# Patient Record
Sex: Male | Born: 1960 | Race: White | Hispanic: No | Marital: Married | Smoking: Former smoker
Health system: Southern US, Community
[De-identification: ages and names within clinical notes are randomized; demographics above are authoritative.]

## PROBLEM LIST (undated history)

## (undated) DIAGNOSIS — E785 Hyperlipidemia, unspecified: Secondary | ICD-10-CM

## (undated) DIAGNOSIS — Z720 Tobacco use: Secondary | ICD-10-CM

## (undated) DIAGNOSIS — I2510 Atherosclerotic heart disease of native coronary artery without angina pectoris: Secondary | ICD-10-CM

## (undated) DIAGNOSIS — E7841 Elevated Lipoprotein(a): Secondary | ICD-10-CM

## (undated) DIAGNOSIS — M109 Gout, unspecified: Secondary | ICD-10-CM

## (undated) HISTORY — DX: Elevated lipoprotein(a): E78.41

## (undated) HISTORY — PX: LEG SURGERY: SHX1003

## (undated) HISTORY — DX: Hyperlipidemia, unspecified: E78.5

## (undated) HISTORY — DX: Tobacco use: Z72.0

## (undated) HISTORY — PX: CARDIAC CATHETERIZATION: SHX172

## (undated) HISTORY — PX: FRACTURE SURGERY: SHX138

---

## 1998-06-23 ENCOUNTER — Emergency Department (HOSPITAL_COMMUNITY): Admission: EM | Admit: 1998-06-23 | Discharge: 1998-06-23 | Payer: Self-pay | Admitting: Emergency Medicine

## 2001-11-28 ENCOUNTER — Encounter: Payer: Self-pay | Admitting: Gastroenterology

## 2001-11-28 ENCOUNTER — Ambulatory Visit (HOSPITAL_COMMUNITY): Admission: RE | Admit: 2001-11-28 | Discharge: 2001-11-28 | Payer: Self-pay | Admitting: Gastroenterology

## 2001-12-02 ENCOUNTER — Ambulatory Visit (HOSPITAL_COMMUNITY): Admission: RE | Admit: 2001-12-02 | Discharge: 2001-12-02 | Payer: Self-pay | Admitting: Gastroenterology

## 2001-12-02 ENCOUNTER — Encounter (INDEPENDENT_AMBULATORY_CARE_PROVIDER_SITE_OTHER): Payer: Self-pay | Admitting: Specialist

## 2004-03-22 ENCOUNTER — Emergency Department (HOSPITAL_COMMUNITY): Admission: EM | Admit: 2004-03-22 | Discharge: 2004-03-22 | Payer: Self-pay | Admitting: Emergency Medicine

## 2005-07-25 ENCOUNTER — Encounter: Admission: RE | Admit: 2005-07-25 | Discharge: 2005-07-25 | Payer: Self-pay | Admitting: Gastroenterology

## 2005-08-02 ENCOUNTER — Encounter: Admission: RE | Admit: 2005-08-02 | Discharge: 2005-08-02 | Payer: Self-pay | Admitting: Gastroenterology

## 2006-10-08 ENCOUNTER — Ambulatory Visit: Payer: Self-pay | Admitting: Gastroenterology

## 2007-10-09 ENCOUNTER — Encounter: Admission: RE | Admit: 2007-10-09 | Discharge: 2007-10-09 | Payer: Self-pay | Admitting: Gastroenterology

## 2010-07-03 ENCOUNTER — Encounter: Admission: RE | Admit: 2010-07-03 | Discharge: 2010-07-03 | Payer: Self-pay | Admitting: Family Medicine

## 2011-02-26 ENCOUNTER — Emergency Department (HOSPITAL_BASED_OUTPATIENT_CLINIC_OR_DEPARTMENT_OTHER)
Admission: EM | Admit: 2011-02-26 | Discharge: 2011-02-26 | Disposition: A | Discharge status: Home / Self Care | Payer: 59 | Attending: Emergency Medicine | Admitting: Emergency Medicine

## 2011-02-26 ENCOUNTER — Emergency Department (INDEPENDENT_AMBULATORY_CARE_PROVIDER_SITE_OTHER): Payer: 59

## 2011-02-26 ENCOUNTER — Other Ambulatory Visit (HOSPITAL_BASED_OUTPATIENT_CLINIC_OR_DEPARTMENT_OTHER): Payer: Self-pay | Admitting: Emergency Medicine

## 2011-02-26 DIAGNOSIS — F17200 Nicotine dependence, unspecified, uncomplicated: Secondary | ICD-10-CM | POA: Insufficient documentation

## 2011-02-26 DIAGNOSIS — R4781 Slurred speech: Secondary | ICD-10-CM

## 2011-02-26 DIAGNOSIS — R471 Dysarthria and anarthria: Secondary | ICD-10-CM | POA: Insufficient documentation

## 2011-02-26 DIAGNOSIS — R1310 Dysphagia, unspecified: Secondary | ICD-10-CM

## 2011-03-03 ENCOUNTER — Other Ambulatory Visit (HOSPITAL_BASED_OUTPATIENT_CLINIC_OR_DEPARTMENT_OTHER): Payer: Self-pay

## 2013-06-10 ENCOUNTER — Emergency Department (HOSPITAL_COMMUNITY)
Admission: EM | Admit: 2013-06-10 | Discharge: 2013-06-10 | Disposition: A | Discharge status: Home / Self Care | Payer: 59 | Attending: Emergency Medicine | Admitting: Emergency Medicine

## 2013-06-10 ENCOUNTER — Emergency Department (HOSPITAL_COMMUNITY): Payer: 59

## 2013-06-10 ENCOUNTER — Encounter (HOSPITAL_COMMUNITY): Payer: Self-pay | Admitting: Emergency Medicine

## 2013-06-10 DIAGNOSIS — Z8781 Personal history of (healed) traumatic fracture: Secondary | ICD-10-CM | POA: Insufficient documentation

## 2013-06-10 DIAGNOSIS — F17200 Nicotine dependence, unspecified, uncomplicated: Secondary | ICD-10-CM | POA: Insufficient documentation

## 2013-06-10 DIAGNOSIS — R479 Unspecified speech disturbances: Secondary | ICD-10-CM

## 2013-06-10 DIAGNOSIS — R4789 Other speech disturbances: Secondary | ICD-10-CM | POA: Insufficient documentation

## 2013-06-10 LAB — BASIC METABOLIC PANEL
BUN: 17 mg/dL (ref 6–23)
Calcium: 9.1 mg/dL (ref 8.4–10.5)
Creatinine, Ser: 0.99 mg/dL (ref 0.50–1.35)
GFR calc Af Amer: >90 mL/min (ref >90)
GFR calc non Af Amer: >90 mL/min (ref >90)
Potassium: 3.9 mEq/L (ref 3.5–5.1)

## 2013-06-10 LAB — CBC
HCT: 44.3 % (ref 39.0–52.0)
MCH: 33.6 pg (ref 26.0–34.0)
MCHC: 34.8 g/dL (ref 30.0–36.0)
RDW: 12.7 % (ref 11.5–15.5)

## 2013-06-10 NOTE — ED Notes (Addendum)
Pt c/o increasing expressive aphasia x 1 week and increasing intermittent numbness and tingling in hands x 2-3 days.  Denies pain and injury.  Pt sts "it feels like the cheeks of my mouth are sucking in and I'm having a hard time forming words."

## 2013-06-10 NOTE — ED Provider Notes (Signed)
CSN: [id number]     Arrival date & time 06/10/13  1047 History   First MD Initiated Contact with Patient 06/10/13 1114     Chief Complaint  Patient presents with  . Numbness  . Aphasia   (Consider location/radiation/quality/duration/timing/severity/associated sxs/prior Treatment) Patient is a 52 y.o. male presenting with neurologic complaint. The history is provided by the patient.  Neurologic Problem This is a new problem. The current episode started more than 1 week ago. The problem occurs constantly. The problem has been gradually worsening. Pertinent negatives include no chest pain, no abdominal pain and no shortness of breath. Nothing aggravates the symptoms. Nothing relieves the symptoms. He has tried nothing for the symptoms. The treatment provided no relief.    History reviewed. No pertinent past medical history. Past Surgical History  Procedure Laterality Date  . Fracture surgery     History reviewed. No pertinent family history. History  Substance Use Topics  . Smoking status: Current Every Day Smoker -- 0.50 packs/day    Types: Cigarettes  . Smokeless tobacco: Never Used  . Alcohol Use: Yes    Review of Systems  Constitutional: Negative for fever.  Respiratory: Negative for cough and shortness of breath.   Cardiovascular: Negative for chest pain and leg swelling.  Gastrointestinal: Negative for vomiting and abdominal pain.  All other systems reviewed and are negative.    Allergies  Review of patient's allergies indicates not on file.  Home Medications  No current outpatient prescriptions on file. BP 147/103  Pulse 79  Temp(Src) 97.9 F (36.6 C) (Oral)  Resp 20  SpO2 99% Physical Exam  Nursing note and vitals reviewed. Constitutional: He is oriented to person, place, and time. He appears well-developed and well-nourished. No distress.  HENT:  Head: Normocephalic and atraumatic.  Mouth/Throat: No oropharyngeal exudate.  Eyes: EOM are normal. Pupils are  equal, round, and reactive to light.  Neck: Normal range of motion. Neck supple.  Cardiovascular: Normal rate and regular rhythm.  Exam reveals no friction rub.   No murmur heard. Pulmonary/Chest: Effort normal and breath sounds normal. No respiratory distress. He has no wheezes. He has no rales.  Abdominal: He exhibits no distension. There is no tenderness. There is no rebound.  Musculoskeletal: Normal range of motion. He exhibits no edema.  Neurological: He is alert and oriented to person, place, and time. No cranial nerve deficit. He exhibits normal muscle tone. Coordination normal.  Occasionally slurs his words, very mild slurring when it does happen.  Skin: He is not diaphoretic.    ED Course  Procedures (including critical care time) Labs Review Labs Reviewed - No data to display Imaging Review No results found.   Date: 06/10/2013  Rate: 70  Rhythm: normal sinus rhythm  QRS Axis: normal  Intervals: normal  ST/T Wave abnormalities: early repolarization  Conduction Disutrbances:none  Narrative Interpretation:   Old EKG Reviewed: none available    MDM   1. Speech problem    52 year old male presents with concerns for speech issues. Patient states he's been having trouble forming words and feels like his speech is changed over the past week. He states he feels like he needs to breast-feed counseling as bad breath. He is not having difficulty swallowing. He denies any headache, vision changes, as weakness or numbness in the arms or legs. He states multiple friends and coworkers as stated that he is having speech difficulties and having difficulty with his words. He saw doctor at an urgent care, and told to get  head scan last week while [place] was at the beach. Vitals are stable. His speech patterns were normal, his voice sounds occasionally slurred and very mildly slurred when it occasionally happens. He is not consistently having aphasia. He has normal swallow. Normal coordination,  normal strength and sensation extremities. Normal cranial nerves. Do not see any evidence of any salivary stones. We'll MRI his head to look for possible ischemia.  No stroke on MRI. Given resource guide for f/u.  Dagmar Hait, MD 06/10/13 [phone]

## 2013-06-10 NOTE — Progress Notes (Signed)
   CARE MANAGEMENT ED NOTE 06/10/2013  Patient:  Simar,Teofilo R   Account Number:  [id number]  Date Initiated:  06/10/2013  Documentation initiated by:  Edd Arbour  Subjective/Objective Assessment:   52 yr old male with united health care coverage without a pcp listed     Subjective/Objective Assessment Detail:     Action/Plan:   CM spoke with pt who confirms he does not have a pcp  see below   Action/Plan Detail:   Anticipated DC Date:       Status Recommendation to Physician:   Result of Recommendation:    Other ED Services  Consult Working Plan    DC Planning Services  Other  PCP issues  Outpatient Services - Pt will follow up    Choice offered to / List presented to:            Status of service:  Completed, signed off  ED Comments:   ED Comments Detail:  [facility] ED CM noted pt with coverage but no pcp listed Spoke with pt who confirms no pcp [facility] ED CM spoke with pt on how to obtain an in network pcp with insurance coverage via the customer service number or web site The pt voiced understanding CM encouraged pt and discussed pt's responsibility to verify with pt's insurance carrier that any recommended medical provider offered by any emergency room or a hospital provider is within the carrier's network. The pt voiced understanding

## 2013-06-10 NOTE — ED Notes (Signed)
Patient transported to MRI 

## 2014-09-02 ENCOUNTER — Other Ambulatory Visit: Payer: Self-pay | Admitting: Family Medicine

## 2014-09-02 DIAGNOSIS — R945 Abnormal results of liver function studies: Secondary | ICD-10-CM

## 2014-09-08 ENCOUNTER — Ambulatory Visit
Admission: RE | Admit: 2014-09-08 | Discharge: 2014-09-08 | Disposition: A | Discharge status: Home / Self Care | Payer: 59 | Source: Ambulatory Visit | Attending: Family Medicine | Admitting: Family Medicine

## 2014-09-08 DIAGNOSIS — R945 Abnormal results of liver function studies: Secondary | ICD-10-CM

## 2015-04-25 ENCOUNTER — Emergency Department (HOSPITAL_COMMUNITY): Payer: Commercial Managed Care - HMO

## 2015-04-25 ENCOUNTER — Encounter (HOSPITAL_COMMUNITY): Payer: Self-pay | Admitting: *Deleted

## 2015-04-25 ENCOUNTER — Emergency Department (HOSPITAL_COMMUNITY)
Admission: EM | Admit: 2015-04-25 | Discharge: 2015-04-25 | Disposition: A | Discharge status: Home / Self Care | Payer: Commercial Managed Care - HMO | Attending: Emergency Medicine | Admitting: Emergency Medicine

## 2015-04-25 ENCOUNTER — Emergency Department (HOSPITAL_COMMUNITY)
Admission: EM | Admit: 2015-04-25 | Discharge: 2015-04-25 | Disposition: A | Discharge status: Nursing Home (Includes ICF) | Payer: Commercial Managed Care - HMO | Source: Home / Self Care

## 2015-04-25 ENCOUNTER — Encounter (HOSPITAL_COMMUNITY): Payer: Self-pay

## 2015-04-25 DIAGNOSIS — R202 Paresthesia of skin: Secondary | ICD-10-CM | POA: Diagnosis not present

## 2015-04-25 DIAGNOSIS — R42 Dizziness and giddiness: Secondary | ICD-10-CM | POA: Diagnosis not present

## 2015-04-25 DIAGNOSIS — I10 Essential (primary) hypertension: Secondary | ICD-10-CM | POA: Diagnosis not present

## 2015-04-25 DIAGNOSIS — R51 Headache: Secondary | ICD-10-CM | POA: Insufficient documentation

## 2015-04-25 DIAGNOSIS — R4701 Aphasia: Secondary | ICD-10-CM | POA: Diagnosis not present

## 2015-04-25 DIAGNOSIS — R519 Headache, unspecified: Secondary | ICD-10-CM

## 2015-04-25 DIAGNOSIS — Z720 Tobacco use: Secondary | ICD-10-CM | POA: Diagnosis not present

## 2015-04-25 DIAGNOSIS — R2990 Unspecified symptoms and signs involving the nervous system: Secondary | ICD-10-CM | POA: Diagnosis not present

## 2015-04-25 DIAGNOSIS — E785 Hyperlipidemia, unspecified: Secondary | ICD-10-CM

## 2015-04-25 DIAGNOSIS — R200 Anesthesia of skin: Secondary | ICD-10-CM | POA: Diagnosis present

## 2015-04-25 LAB — CBC
HCT: 42.9 % (ref 39.0–52.0)
HEMOGLOBIN: 14.6 g/dL (ref 13.0–17.0)
MCH: 33.6 pg (ref 26.0–34.0)
MCHC: 34 g/dL (ref 30.0–36.0)
MCV: 98.6 fL (ref 78.0–100.0)
Platelets: 205 10*3/uL (ref 150–400)
RBC: 4.35 MIL/uL (ref 4.22–5.81)
RDW: 13 % (ref 11.5–15.5)
WBC: 7 10*3/uL (ref 4.0–10.5)

## 2015-04-25 LAB — RAPID URINE DRUG SCREEN, HOSP PERFORMED
AMPHETAMINES: NOT DETECTED
BARBITURATES: NOT DETECTED
Benzodiazepines: NOT DETECTED
COCAINE: NOT DETECTED
Opiates: NOT DETECTED
TETRAHYDROCANNABINOL: NOT DETECTED

## 2015-04-25 LAB — COMPREHENSIVE METABOLIC PANEL
ALBUMIN: 4 g/dL (ref 3.5–5.0)
ALK PHOS: 55 U/L (ref 38–126)
ALT: 92 U/L — ABNORMAL HIGH (ref 17–63)
AST: 48 U/L — AB (ref 15–41)
Anion gap: 10 (ref 5–15)
BILIRUBIN TOTAL: 0.4 mg/dL (ref 0.3–1.2)
BUN: 15 mg/dL (ref 6–20)
CO2: 24 mmol/L (ref 22–32)
Calcium: 9.2 mg/dL (ref 8.9–10.3)
Chloride: 106 mmol/L (ref 101–111)
Creatinine, Ser: 1 mg/dL (ref 0.61–1.24)
GFR calc Af Amer: >60 mL/min (ref >60)
GFR calc non Af Amer: >60 mL/min (ref >60)
GLUCOSE: 79 mg/dL (ref 65–99)
POTASSIUM: 3.9 mmol/L (ref 3.5–5.1)
Sodium: 140 mmol/L (ref 135–145)
TOTAL PROTEIN: 7.4 g/dL (ref 6.5–8.1)

## 2015-04-25 LAB — I-STAT CHEM 8, ED
BUN: 19 mg/dL (ref 6–20)
CREATININE: 0.9 mg/dL (ref 0.61–1.24)
Calcium, Ion: 1.19 mmol/L (ref 1.12–1.23)
Chloride: 107 mmol/L (ref 101–111)
Glucose, Bld: 77 mg/dL (ref 65–99)
HEMATOCRIT: 45 % (ref 39.0–52.0)
Hemoglobin: 15.3 g/dL (ref 13.0–17.0)
POTASSIUM: 3.9 mmol/L (ref 3.5–5.1)
Sodium: 141 mmol/L (ref 135–145)
TCO2: 21 mmol/L (ref 0–100)

## 2015-04-25 LAB — I-STAT TROPONIN, ED: Troponin i, poc: 0 ng/mL (ref 0.00–0.08)

## 2015-04-25 LAB — DIFFERENTIAL
BASOS ABS: 0.1 10*3/uL (ref 0.0–0.1)
Basophils Relative: 1 % (ref 0–1)
Eosinophils Absolute: 0.2 10*3/uL (ref 0.0–0.7)
Eosinophils Relative: 3 % (ref 0–5)
LYMPHS ABS: 1.8 10*3/uL (ref 0.7–4.0)
LYMPHS PCT: 25 % (ref 12–46)
Monocytes Absolute: 0.8 10*3/uL (ref 0.1–1.0)
Monocytes Relative: 11 % (ref 3–12)
NEUTROS PCT: 60 % (ref 43–77)
Neutro Abs: 4.2 10*3/uL (ref 1.7–7.7)

## 2015-04-25 LAB — URINALYSIS, ROUTINE W REFLEX MICROSCOPIC
BILIRUBIN URINE: NEGATIVE
GLUCOSE, UA: NEGATIVE mg/dL
HGB URINE DIPSTICK: NEGATIVE
KETONES UR: 15 mg/dL — AB
Leukocytes, UA: NEGATIVE
NITRITE: NEGATIVE
PH: 5.5 (ref 5.0–8.0)
Protein, ur: NEGATIVE mg/dL
Specific Gravity, Urine: 1.028 (ref 1.005–1.030)
Urobilinogen, UA: 0.2 mg/dL (ref 0.0–1.0)

## 2015-04-25 LAB — ETHANOL: Alcohol, Ethyl (B): 31 mg/dL — ABNORMAL HIGH (ref <5)

## 2015-04-25 LAB — PROTIME-INR
INR: 1.08 (ref 0.00–1.49)
Prothrombin Time: 14.2 seconds (ref 11.6–15.2)

## 2015-04-25 LAB — APTT: APTT: 31 s (ref 24–37)

## 2015-04-25 MED ORDER — MECLIZINE HCL 25 MG PO TABS: 25.0000 mg | ORAL_TABLET | Freq: Three times a day (TID) | ORAL | Status: DC | PRN

## 2015-04-25 MED ORDER — SODIUM CHLORIDE 0.9 % IV SOLN
Freq: Once | INTRAVENOUS | Status: AC
  Administered 2015-04-25: 18:00:00 via INTRAVENOUS

## 2015-04-25 NOTE — ED Notes (Signed)
Verified with provider, pt is outside of Code Stroke time parameters. Order to Activate Code Stroke cancelled.

## 2015-04-25 NOTE — ED Notes (Signed)
Headache that started yesterday. 

## 2015-04-25 NOTE — ED Notes (Signed)
Pt states that yesterday he experienced difficulty speaking and "mouth thickness." Pt states that last week he was having intermittent dizziness with position changes in bed and upon standing. Currently pt appears deficit free besides some left facial numbness.

## 2015-04-25 NOTE — ED Notes (Addendum)
States that since yesterday, he has had trouble thinking of words to say , mouth feels thick,brain feels scrambled for past few days, HA pain "7', took 2 tylenol w no change in symptoms

## 2015-04-25 NOTE — ED Provider Notes (Signed)
CSN: [id number]     Arrival date & time 04/25/15  1806 History   First MD Initiated Contact with Patient 04/25/15 1820     Chief Complaint  Patient presents with  . Numbness  . Aphasia     (Consider location/radiation/quality/duration/timing/severity/associated sxs/prior Treatment) HPI Comments: Patient presents today from [facility] due to concern for CVA.   He reports that he has been having dizziness over the past week.  He describes it as room spinning induced with movement, which only lasts a few seconds.  He states that yesterday evening he felt that he was having difficulty moving his mouth to form words.  He also reports perioral numbness that has been present since last evening.  He also reports that he has had an intermittent headache of the left head shooting over to the right head that has been occurring intermittently since last night.  He also states that he has had some numbness of his left arm that has been present since last evening.  He denies difficulty swallowing, weakness, vision changes, nausea, vomiting, chest pain, or SOB.  He denies history of HTN or DM.  He does have a history of Hyperlipidemia and does smoke 0.5 ppd.   He reports that his mother had a CVA at the age of 48 years old.  The history is provided by the patient.    History reviewed. No pertinent past medical history. Past Surgical History  Procedure Laterality Date  . Fracture surgery     Family History  Problem Relation Age of Onset  . Stroke Mother 65  . Heart attack Father    Social History  Substance Use Topics  . Smoking status: Current Every Day Smoker -- 0.50 packs/day    Types: Cigarettes  . Smokeless tobacco: Never Used  . Alcohol Use: Yes    Review of Systems  All other systems reviewed and are negative.     Allergies  Review of patient's allergies indicates no known allergies.  Home Medications   Prior to Admission medications   Medication Sig Start Date End Date Taking?  Authorizing Provider  acetaminophen (TYLENOL) 500 MG tablet Take 1,000 mg by mouth every 6 (six) hours as needed for moderate pain.   Yes Historical Provider, MD  colchicine 0.6 MG tablet Take 0.6 mg by mouth daily as needed (for gout flareups).  02/16/15  Yes Historical Provider, MD   BP 115/67 mmHg  Pulse 77  Temp(Src) 98.7 F (37.1 C) (Oral)  Resp 20  Ht 5' 8.5" (1.74 m)  Wt 195 lb (88.451 kg)  BMI 29.21 kg/m2  SpO2 96% Physical Exam  Constitutional: He appears well-developed and well-nourished.  HENT:  Head: Normocephalic and atraumatic.  Mouth/Throat: Oropharynx is clear and moist.  Eyes: EOM are normal. Pupils are equal, round, and reactive to light.  Neck: Normal range of motion. Neck supple.  Cardiovascular: Normal rate, regular rhythm and normal heart sounds.   Pulmonary/Chest: Effort normal and breath sounds normal.  Musculoskeletal: Normal range of motion.  Neurological: He is alert. No cranial nerve deficit or sensory deficit. Coordination and gait normal. GCS eye subscore is 4. GCS verbal subscore is 5. GCS motor subscore is 6.  Muscle strength 5/5 or RUE and RLE Muscle strength 4/5 of LUE and LLE  Skin: Skin is warm and dry.  Psychiatric: He has a normal mood and affect.  Nursing note and vitals reviewed.   ED Course  Procedures (including critical care time) Labs Review Labs Reviewed  ETHANOL  PROTIME-INR  APTT  CBC  DIFFERENTIAL  COMPREHENSIVE METABOLIC PANEL  URINE RAPID DRUG SCREEN, HOSP PERFORMED  URINALYSIS, ROUTINE W REFLEX MICROSCOPIC (NOT AT [facility])  I-STAT CHEM 8, ED  I-STAT TROPOININ, ED    Imaging Review No results found. I have personally reviewed and evaluated these images and lab results as part of my medical decision-making.   EKG Interpretation   Date/Time:  Monday April 25 2015 18:21:31 EDT Ventricular Rate:  77 PR Interval:  194 QRS Duration: 83 QT Interval:  354 QTC Calculation: 401 R Axis:   64 Text Interpretation:  Sinus  rhythm Atrial premature complex ST elev,  probable normal early repol pattern No significant change since last  tracing Artifact Confirmed by ZACKOWSKI  MD, SCOTT [phone]) on 04/25/2015  6:31:04 PM      MDM   Final diagnoses:  None   Patient presents today with complaints of dizziness, numbness of his left arm, perioral numbness, and dysphagia. Onset of symptoms last evening so code stroke was not called.  Labs today unremarkable.  CT head is negative.  MRI brain is also negative.  Patient stable for discharge.  Patient discussed with Dr. Deretha Emory who is in agreement with the plan.      Santiago Glad, PA-C 04/28/15 1131  Vanetta Mulders, MD 04/29/15 [phone]

## 2015-04-25 NOTE — ED Notes (Signed)
Pt back from MRI 

## 2015-04-25 NOTE — ED Notes (Signed)
PA at bedside.

## 2015-04-25 NOTE — ED Provider Notes (Signed)
CSN: [id number]     Arrival date & time 04/25/15  1658 History   None    Chief Complaint  Patient presents with  . Cough  . Aphasia   (Consider location/radiation/quality/duration/timing/severity/associated sxs/prior Treatment) HPI  1 day history of dizziness with changing position from sitting or lying to standing. Typically last for a minute or less and then resolves. Does not worsen with rotation of the head. Associated w/ L sided headache and difficulty finding words to speak. Denies any CP, SOB, palpitations, nausea, syncope, Fevers, cough.  Symptoms are getting worse.  History reviewed. No pertinent past medical history. Past Surgical History  Procedure Laterality Date  . Fracture surgery     Family History  Problem Relation Age of Onset  . Stroke Mother 16  . Heart attack Father    Social History  Substance Use Topics  . Smoking status: Current Every Day Smoker -- 0.50 packs/day    Types: Cigarettes  . Smokeless tobacco: Never Used  . Alcohol Use: Yes    Review of Systems Per HPI with all other pertinent systems negative.   Allergies  Review of patient's allergies indicates no known allergies.  Home Medications   Prior to Admission medications   Not on File   BP 141/91 mmHg  Pulse 86  Temp(Src) 98.3 F (36.8 C) (Oral)  SpO2 99% Physical Exam Physical Exam  Constitutional: oriented to person, place, and time. appears well-developed and well-nourished. No distress.  HENT:  Head: Normocephalic and atraumatic.  Eyes: EOMI. PERRL.  Neck: Normal range of motion.  Cardiovascular: RRR, no m/r/g, 2+ distal pulses,  Pulmonary/Chest: Effort normal and breath sounds normal. No respiratory distress.  Abdominal: Soft. Bowel sounds are normal. NonTTP, no distension.  Musculoskeletal: Normal range of motion. Non ttp, no effusion.  Neurological: Decreased sensation of L facial sensation - CN otherwise intact, rhomberg negative, no dysmetria,.  Skin: Skin is warm. No  rash noted. non diaphoretic.  Psychiatric: normal mood and affect. behavior is normal. Judgment and thought content normal.   ED Course  Procedures (including critical care time) Labs Review Labs Reviewed - No data to display  Imaging Review No results found.   MDM   1. Stroke-like symptoms   2. HLD (hyperlipidemia)   3. Essential hypertension    Patient presenting with symptoms worrisome of a stroke including aphasia, left-sided headache, dizziness. Of note orthostatic vital signs are normal. Patient is hypertensive (165/115 standing). Family history significant for mother with a stroke at age 35 and father with heart attacks. Patient states he's otherwise been healthy and has only been diagnosed with hyperlipidemia in the past. We will start an IV on the patient consented down to the emergency room for stroke workup as deemed appropriate by ED staff.   Ozella Rocks, MD 04/25/15 [phone]

## 2015-04-25 NOTE — ED Notes (Signed)
Pt in MRI.

## 2015-06-16 ENCOUNTER — Other Ambulatory Visit: Payer: Self-pay | Admitting: Family Medicine

## 2015-06-16 DIAGNOSIS — R7989 Other specified abnormal findings of blood chemistry: Secondary | ICD-10-CM

## 2015-06-16 DIAGNOSIS — R945 Abnormal results of liver function studies: Principal | ICD-10-CM

## 2015-06-20 ENCOUNTER — Ambulatory Visit
Admission: RE | Admit: 2015-06-20 | Discharge: 2015-06-20 | Disposition: A | Discharge status: Home / Self Care | Payer: Commercial Managed Care - HMO | Source: Ambulatory Visit | Attending: Family Medicine | Admitting: Family Medicine

## 2015-06-20 ENCOUNTER — Other Ambulatory Visit: Payer: Commercial Managed Care - HMO

## 2015-06-20 DIAGNOSIS — R945 Abnormal results of liver function studies: Principal | ICD-10-CM

## 2015-06-20 DIAGNOSIS — R7989 Other specified abnormal findings of blood chemistry: Secondary | ICD-10-CM

## 2015-07-01 ENCOUNTER — Telehealth: Payer: Self-pay | Admitting: Lab

## 2015-07-01 NOTE — Telephone Encounter (Signed)
Pt has appmt to get labs done on 07/05/2015 @ 1130.  He will receive an appmt with Dr. Luciana Axeomer at that time

## 2015-07-05 ENCOUNTER — Other Ambulatory Visit: Payer: Commercial Managed Care - HMO

## 2015-07-05 DIAGNOSIS — B1920 Unspecified viral hepatitis C without hepatic coma: Secondary | ICD-10-CM

## 2015-07-05 LAB — CBC WITH DIFFERENTIAL/PLATELET
BASOS PCT: 1 % (ref 0–1)
Basophils Absolute: 0.1 10*3/uL (ref 0.0–0.1)
Eosinophils Absolute: 0.3 10*3/uL (ref 0.0–0.7)
Eosinophils Relative: 4 % (ref 0–5)
HCT: 46.8 % (ref 39.0–52.0)
HEMOGLOBIN: 16 g/dL (ref 13.0–17.0)
Lymphocytes Relative: 29 % (ref 12–46)
Lymphs Abs: 1.9 10*3/uL (ref 0.7–4.0)
MCH: 33.4 pg (ref 26.0–34.0)
MCHC: 34.2 g/dL (ref 30.0–36.0)
MCV: 97.7 fL (ref 78.0–100.0)
MONOS PCT: 11 % (ref 3–12)
MPV: 11.3 fL (ref 8.6–12.4)
Monocytes Absolute: 0.7 10*3/uL (ref 0.1–1.0)
NEUTROS ABS: 3.6 10*3/uL (ref 1.7–7.7)
NEUTROS PCT: 55 % (ref 43–77)
Platelets: 209 10*3/uL (ref 150–400)
RBC: 4.79 MIL/uL (ref 4.22–5.81)
RDW: 12.6 % (ref 11.5–15.5)
WBC: 6.5 10*3/uL (ref 4.0–10.5)

## 2015-07-05 LAB — IRON: Iron: 200 ug/dL — ABNORMAL HIGH (ref 50–180)

## 2015-07-06 LAB — PROTIME-INR
INR: 1.01 (ref <1.50)
PROTHROMBIN TIME: 13.4 s (ref 11.6–15.2)

## 2015-07-06 LAB — HEPATITIS C RNA QUANTITATIVE
HCV Quantitative Log: 6.6 {Log} — ABNORMAL HIGH (ref <1.18)
HCV Quantitative: 3974796 IU/mL — ABNORMAL HIGH (ref <15)

## 2015-07-06 LAB — HIV ANTIBODY (ROUTINE TESTING W REFLEX): HIV 1&2 Ab, 4th Generation: NONREACTIVE

## 2015-07-06 LAB — ANA: ANA: NEGATIVE

## 2015-07-06 LAB — HEPATITIS B SURFACE ANTIBODY,QUALITATIVE: Hep B S Ab: NEGATIVE

## 2015-07-06 LAB — HEPATITIS B CORE ANTIBODY, TOTAL: Hep B Core Total Ab: NONREACTIVE

## 2015-07-06 LAB — HEPATITIS A ANTIBODY, TOTAL: Hep A Total Ab: NONREACTIVE

## 2015-07-06 LAB — HEPATITIS B SURFACE ANTIGEN: Hepatitis B Surface Ag: NEGATIVE

## 2015-07-06 NOTE — Telephone Encounter (Signed)
Pt had labs done on 07/05/2015 and was given an appmt to see Dr Luciana Axeomer on 08/17/2015 @ 920am

## 2015-07-08 LAB — HEPATITIS C GENOTYPE

## 2015-08-10 ENCOUNTER — Ambulatory Visit (INDEPENDENT_AMBULATORY_CARE_PROVIDER_SITE_OTHER): Payer: Commercial Managed Care - HMO | Admitting: Internal Medicine

## 2015-08-10 ENCOUNTER — Encounter: Payer: Self-pay | Admitting: Internal Medicine

## 2015-08-10 VITALS — BP 122/81 | HR 72 | Temp 97.9°F | Ht 68.5 in | Wt 204.0 lb

## 2015-08-10 DIAGNOSIS — B182 Chronic viral hepatitis C: Secondary | ICD-10-CM

## 2015-08-10 DIAGNOSIS — M109 Gout, unspecified: Secondary | ICD-10-CM | POA: Insufficient documentation

## 2015-08-10 MED ORDER — LEDIPASVIR-SOFOSBUVIR 90-400 MG PO TABS: 1.0000 | ORAL_TABLET | Freq: Every day | ORAL | Status: DC

## 2015-08-10 NOTE — Patient Instructions (Signed)
Date 08/10/2015  Dear Mr. Jacob Black, As discussed in the ID Clinic, your hepatitis C therapy will include the following medications:          Harvoni 90mg /400mg  tablet:           Take 1 tablet by mouth once daily   Please note that ALL MEDICATIONS WILL START ON THE SAME DATE for a total of 12 weeks. ---------------------------------------------------------------- Your HCV Treatment Start Date: TBA   Your HCV genotype:  1a    Liver Fibrosis: TBD    ---------------------------------------------------------------- YOUR PHARMACY CONTACT:   [facility] [address] Phone: [phone] Hours: Monday to Friday 7:30 am to 6:00 pm   Please always contact your pharmacy at least 3-4 business days before you run out of medications to ensure your next month's medication is ready or 1 week prior to running out if you receive it by mail.  Remember, each prescription is for 28 days. ---------------------------------------------------------------- GENERAL NOTES REGARDING YOUR HEPATITIS C MEDICATION:  SOFOSBUVIR/LEDIPASVIR (HARVONI): - Harvoni tablet is taken daily with OR without food. - The tablets are orange. - The tablets should be stored at room temperature.  - Acid reducing agents such as H2 blockers (ie. Pepcid (famotidine), Zantac (ranitidine), Tagamet (cimetidine), Axid (nizatidine) and proton pump inhibitors (ie. Prilosec (omeprazole), Protonix (pantoprazole), Nexium (esomeprazole), or Aciphex (rabeprazole)) can decrease effectiveness of Harvoni. Do not take until you have discussed with a health care provider.    -Antacids that contain magnesium and/or aluminum hydroxide (ie. Milk of Magensia, Rolaids, Gaviscon, Maalox, Mylanta, an dArthritis Pain Formula)can reduce absorption of Harvoni, so take them at least 4 hours before or after Harvoni.  -Calcium carbonate (calcium supplements or antacids such as Tums, Caltrate,  Os-Cal)needs to be taken at least 4 hours hours before or after Harvoni.  -St. John's wort or any products that contain St. John's wort like some herbal supplements  Please inform the office prior to starting any of these medications.  - The common side effects associated with Harvoni include:      1. Fatigue      2. Headache      3. Nausea      4. Diarrhea      5. Insomnia  Please note that this only lists the most common side effects and is NOT a comprehensive list of the potential side effects of these medications. For more information, please review the drug information sheets that come with your medication package from the pharmacy.  ---------------------------------------------------------------- GENERAL HELPFUL HINTS ON HCV THERAPY: 1. Stay well-hydrated. 2. Notify the ID Clinic of any changes in your other over-the-counter/herbal or prescription medications. 3. If you miss a dose of your medication, take the missed dose as soon as you remember. Return to your regular time/dose schedule the next day.  4.  Do not stop taking your medications without first talking with your healthcare provider. 5.  You may take Tylenol (acetaminophen), as long as the dose is less than 2000 mg (OR no more than 4 tablets of the Tylenol Extra Strengths 500mg  tablet) in 24 hours. 6.  You will see our pharmacist-specialist within the first 2 weeks of starting your medication. 7.  You will need to obtain routine labs around week 4 and12 weeks after starting and then 3 to 6 months after finishing Harvoni.    Staci RighterOMER, Argelio Granier, MD  [facility] [address] [place],  [place]  [address] [phone]
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
## 2015-08-11 ENCOUNTER — Other Ambulatory Visit: Payer: Self-pay | Admitting: Pharmacist Clinician (PhC)/ Clinical Pharmacy Specialist

## 2015-08-11 MED ORDER — LEDIPASVIR-SOFOSBUVIR 90-400 MG PO TABS: 1.0000 | ORAL_TABLET | Freq: Every day | ORAL | Status: DC

## 2015-08-12 ENCOUNTER — Encounter: Payer: Self-pay | Admitting: Pharmacy Technician

## 2015-08-17 ENCOUNTER — Encounter: Payer: Commercial Managed Care - HMO | Admitting: Internal Medicine

## 2015-09-07 ENCOUNTER — Ambulatory Visit (HOSPITAL_COMMUNITY)
Admission: RE | Admit: 2015-09-07 | Discharge: 2015-09-07 | Disposition: A | Discharge status: Home / Self Care | Payer: Commercial Managed Care - HMO | Source: Ambulatory Visit | Attending: Internal Medicine | Admitting: Internal Medicine

## 2015-09-07 DIAGNOSIS — R7989 Other specified abnormal findings of blood chemistry: Secondary | ICD-10-CM | POA: Diagnosis not present

## 2015-09-07 DIAGNOSIS — B182 Chronic viral hepatitis C: Secondary | ICD-10-CM | POA: Diagnosis present

## 2015-09-08 ENCOUNTER — Other Ambulatory Visit: Payer: Commercial Managed Care - HMO

## 2015-09-08 ENCOUNTER — Ambulatory Visit (INDEPENDENT_AMBULATORY_CARE_PROVIDER_SITE_OTHER): Payer: Commercial Managed Care - HMO | Admitting: Pharmacist Clinician (PhC)/ Clinical Pharmacy Specialist

## 2015-09-08 DIAGNOSIS — B182 Chronic viral hepatitis C: Secondary | ICD-10-CM

## 2015-09-08 NOTE — Progress Notes (Signed)
HPI: Jacob Black is a 55 y.o. male who is for his pharmacy hep c f/u.   Lab Results  Component Value Date   HCVGENOTYPE 1a 07/05/2015    Allergies: No Known Allergies  Vitals:    Past Medical History: No past medical history on file.  Social History: Social History   Social History  . Marital Status: Married    Spouse Name: N/A  . Number of Children: N/A  . Years of Education: N/A   Social History Main Topics  . Smoking status: Current Every Day Smoker -- 0.50 packs/day    Types: Cigarettes    Start date: 09/03/1974  . Smokeless tobacco: Never Used  . Alcohol Use: 0.0 oz/week    0 Standard drinks or equivalent per week     Comment: occasional  . Drug Use: No  . Sexual Activity: Not on file   Other Topics Concern  . Not on file   Social History Narrative    Labs: HEP B S AB (no units)  Date Value  07/05/2015 NEG   HEPATITIS B SURFACE AG (no units)  Date Value  07/05/2015 NEGATIVE    Lab Results  Component Value Date   HCVGENOTYPE 1a 07/05/2015    Hepatitis C RNA quantitative Latest Ref Rng 07/05/2015  HCV Quantitative <15 IU/mL [id number](H)  HCV Quantitative Log <1.18 log 10 6.60(H)    AST (U/L)  Date Value  04/25/2015 48*   ALT (U/L)  Date Value  04/25/2015 92*   INR (no units)  Date Value  07/05/2015 1.01  04/25/2015 1.08    CrCl: CrCl cannot be calculated (Unknown ideal weight.).  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 No side effects. He only takes colchicine on gout flares only and has not taken in a while. It has a minor interaction only. He is on no OTC meds except for some nyquil for his colds over the past couple of days. Just warned him that it has some tylenol in it already and to not take too much total dose. He understood. His elastography also came back yesterday and it  showed F3/4. Explain the results to him. We are going to get labs today and he is to f/u with dr. Luciana Axeomer in Feb. He is going to call for the results of his labs in a couple of day.   Recommendations: Cont Harvoni 1 PO qday x 3 months VL today  Jacob Black, Jacob Black, [place]arm.D., BCPS, AAHIVP Clinical Infectious Disease Pharmacist [facility] 09/08/2015, 10:25 AM

## 2015-09-13 LAB — HEPATITIS C RNA QUANTITATIVE
HCV Quantitative Log: <1.18 {Log} (ref <1.18)
HCV Quantitative: <15 IU/mL (ref <15)

## 2015-09-28 ENCOUNTER — Telehealth: Payer: Self-pay | Admitting: Pharmacist Clinician (PhC)/ Clinical Pharmacy Specialist

## 2015-09-28 NOTE — Telephone Encounter (Signed)
Jacob Black called to inquired about his hep C VL. Explained to him that it's now <15 after about 1 month on therapy. He was happy to hear it. He also asked about whether Harvoni would induce stress. I told him no. He has other factors that are causing this right now.

## 2015-10-13 ENCOUNTER — Ambulatory Visit (INDEPENDENT_AMBULATORY_CARE_PROVIDER_SITE_OTHER): Payer: Commercial Managed Care - HMO | Admitting: Internal Medicine

## 2015-10-13 ENCOUNTER — Encounter: Payer: Self-pay | Admitting: Internal Medicine

## 2015-10-13 VITALS — BP 150/84 | HR 81 | Temp 97.3°F | Wt 205.0 lb

## 2015-10-13 DIAGNOSIS — K7400 Hepatic fibrosis, unspecified: Secondary | ICD-10-CM

## 2015-10-13 DIAGNOSIS — B182 Chronic viral hepatitis C: Secondary | ICD-10-CM

## 2015-10-15 DIAGNOSIS — K7400 Hepatic fibrosis, unspecified: Secondary | ICD-10-CM | POA: Insufficient documentation

## 2015-10-15 NOTE — Assessment & Plan Note (Addendum)
Will need follow up with GI but will defer for now. Deferring Hepatitis A and B as well until he completes treatment.

## 2015-10-15 NOTE — Progress Notes (Signed)
   Subjective:    Patient ID: Jacob Black, male    DOB: [birth year], 55 y.o.   MRN: [id number]  HPI Here for follow up of HCV.   Has genotype 1a, viral load of 3.9 million, hepatitis A and B non immune.  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  Started Harvoni about 9 weeks ago and initial viral load after 4 weeks was undetectable.  He is noticing more irritability since starting Harvoni with short trigger with his wife but no SI or HI. He feels he is handling it well despite his irritability and feels he wants to continue with the medication.  Also has noted some fatigue but continues with his ADLs.  No missed doses.     Review of Systems  Constitutional: Positive for fatigue. Negative for activity change.  Gastrointestinal: Negative for nausea and diarrhea.  Skin: Negative for rash.  Neurological: Negative for dizziness and headaches.  Psychiatric/Behavioral: Negative for sleep disturbance.       Objective:   Physical Exam  Constitutional: He appears well-developed and well-nourished. No distress.  Eyes: No scleral icterus.  Cardiovascular: Normal rate, regular rhythm and normal heart sounds.   No murmur heard. Pulmonary/Chest: Effort normal and breath sounds normal. No respiratory distress.  Skin: No rash noted.          Assessment & Plan:

## 2015-10-15 NOTE — Assessment & Plan Note (Signed)
Will continue treatment and check vl after completion.  He does not feel he needs to stop.

## 2015-10-24 ENCOUNTER — Telehealth: Payer: Self-pay | Admitting: *Deleted

## 2015-10-24 NOTE — Telephone Encounter (Signed)
Patient called, stating he received a letter informing him that his elastography (completed 09/07/15) needed more information from the ordering provider in order for the claim to be paid.  No notes in the chart that this was denied prior authorization. Will 'cc Marylen Ponto for follow up. Andree Coss, RN

## 2015-10-25 NOTE — Telephone Encounter (Signed)
It may have been under the wrong diagnosis code.  Should be under the chronic hepatitis C.   thanks

## 2015-10-26 ENCOUNTER — Telehealth: Payer: Self-pay | Admitting: *Deleted

## 2015-11-01 NOTE — Telephone Encounter (Signed)
[employer], they do have the correct diagnosis codes. Per insurance, they no longer need additional information for the claim. The patient has a bill of approximately $589. $500 is from his deductible (which is now fulfilled), $89 is from his co-insurance for the image.  RN left message with this information on the patient's voicemail. Andree Coss, RN

## 2015-11-08 NOTE — Telephone Encounter (Signed)
Error

## 2015-11-15 ENCOUNTER — Ambulatory Visit (INDEPENDENT_AMBULATORY_CARE_PROVIDER_SITE_OTHER): Payer: Commercial Managed Care - HMO | Admitting: Internal Medicine

## 2015-11-15 ENCOUNTER — Encounter: Payer: Self-pay | Admitting: Internal Medicine

## 2015-11-15 VITALS — BP 149/103 | HR 77 | Temp 98.0°F | Wt 210.0 lb

## 2015-11-15 DIAGNOSIS — K7400 Hepatic fibrosis, unspecified: Secondary | ICD-10-CM

## 2015-11-15 DIAGNOSIS — B182 Chronic viral hepatitis C: Secondary | ICD-10-CM | POA: Diagnosis not present

## 2015-11-15 LAB — COMPLETE METABOLIC PANEL WITH GFR
ALT: 27 U/L (ref 9–46)
AST: 23 U/L (ref 10–35)
Albumin: 4.3 g/dL (ref 3.6–5.1)
Alkaline Phosphatase: 56 U/L (ref 40–115)
BUN: 18 mg/dL (ref 7–25)
CALCIUM: 9.9 mg/dL (ref 8.6–10.3)
CHLORIDE: 103 mmol/L (ref 98–110)
CO2: 24 mmol/L (ref 20–31)
Creat: 1.01 mg/dL (ref 0.70–1.33)
GFR, EST NON AFRICAN AMERICAN: 85 mL/min (ref >=60)
GFR, Est African American: >89 mL/min (ref >=60)
Glucose, Bld: 141 mg/dL — ABNORMAL HIGH (ref 65–99)
POTASSIUM: 4.4 mmol/L (ref 3.5–5.3)
Sodium: 138 mmol/L (ref 135–146)
Total Bilirubin: 0.4 mg/dL (ref 0.2–1.2)
Total Protein: 7.6 g/dL (ref 6.1–8.1)

## 2015-11-15 NOTE — Assessment & Plan Note (Signed)
Will send to GI for ? EGD HCC screening next visit and every 6 months.

## 2015-11-15 NOTE — Progress Notes (Signed)
   Subjective:    Patient ID: Jacob Black, male    DOB: 06/19/1961, 55 y.o.   MRN: [id number]  HPI Here for follow up of HCV.   Has genotype 1a, viral load of 3.9 million, hepatitis A and B non immune.  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  Now has just completed Harvoni.  Improved mood off of the medication.  Pleased to be done.  Does not want the hepatitis A and B vaccine without sedation.  No weight loss. Drinks some.     Review of Systems  Constitutional: Negative for activity change and fatigue.  Gastrointestinal: Negative for nausea and diarrhea.  Skin: Negative for rash.  Neurological: Negative for dizziness and headaches.  Psychiatric/Behavioral: Negative for sleep disturbance.       Objective:   Physical Exam  Constitutional: He appears well-developed and well-nourished. No distress.  Eyes: No scleral icterus.  Cardiovascular: Normal rate, regular rhythm and normal heart sounds.   No murmur heard. Skin: No rash noted.   Social History   Social History  . Marital Status: Married    Spouse Name: N/A  . Number of Children: N/A  . Years of Education: N/A   Occupational History  . Not on file.   Social History Main Topics  . Smoking status: Current Every Day Smoker -- 0.50 packs/day    Types: Cigarettes    Start date: 09/03/1974  . Smokeless tobacco: Never Used  . Alcohol Use: 0.0 oz/week    0 Standard drinks or equivalent per week     Comment: occasional  . Drug Use: No  . Sexual Activity: Not on file   Other Topics Concern  . Not on file   Social History Narrative         Assessment & Plan:

## 2015-11-15 NOTE — Assessment & Plan Note (Signed)
End of treatment lab today and rtc 4 months for SVR12.   

## 2015-11-16 LAB — HEPATITIS C RNA QUANTITATIVE: HCV Quantitative: NOT DETECTED IU/mL (ref <15)

## 2016-03-05 ENCOUNTER — Ambulatory Visit (HOSPITAL_COMMUNITY): Payer: Commercial Managed Care - HMO

## 2016-03-05 ENCOUNTER — Encounter (HOSPITAL_COMMUNITY): Payer: Self-pay | Admitting: Emergency Medicine

## 2016-03-05 ENCOUNTER — Ambulatory Visit (HOSPITAL_COMMUNITY)
Admission: EM | Admit: 2016-03-05 | Discharge: 2016-03-05 | Disposition: A | Discharge status: Home / Self Care | Payer: Commercial Managed Care - HMO | Attending: Family Medicine | Admitting: Family Medicine

## 2016-03-05 DIAGNOSIS — F17210 Nicotine dependence, cigarettes, uncomplicated: Secondary | ICD-10-CM | POA: Diagnosis not present

## 2016-03-05 DIAGNOSIS — Z8249 Family history of ischemic heart disease and other diseases of the circulatory system: Secondary | ICD-10-CM | POA: Insufficient documentation

## 2016-03-05 DIAGNOSIS — S39012A Strain of muscle, fascia and tendon of lower back, initial encounter: Secondary | ICD-10-CM | POA: Insufficient documentation

## 2016-03-05 DIAGNOSIS — W19XXXA Unspecified fall, initial encounter: Secondary | ICD-10-CM | POA: Diagnosis not present

## 2016-03-05 DIAGNOSIS — S93602A Unspecified sprain of left foot, initial encounter: Secondary | ICD-10-CM | POA: Diagnosis not present

## 2016-03-05 DIAGNOSIS — Z823 Family history of stroke: Secondary | ICD-10-CM | POA: Diagnosis not present

## 2016-03-05 DIAGNOSIS — M62830 Muscle spasm of back: Secondary | ICD-10-CM | POA: Insufficient documentation

## 2016-03-05 DIAGNOSIS — M79672 Pain in left foot: Secondary | ICD-10-CM

## 2016-03-05 MED ORDER — KETOROLAC TROMETHAMINE 60 MG/2ML IM SOLN
60.0000 mg | Freq: Once | INTRAMUSCULAR | Status: AC
  Administered 2016-03-05: 60 mg via INTRAMUSCULAR

## 2016-03-05 MED ORDER — HYDROCODONE-ACETAMINOPHEN 5-325 MG PO TABS: 1.0000 | ORAL_TABLET | ORAL | Status: DC | PRN

## 2016-03-05 MED ORDER — KETOROLAC TROMETHAMINE 60 MG/2ML IM SOLN
INTRAMUSCULAR | Status: AC
  Filled 2016-03-05: qty 2

## 2016-03-05 MED ORDER — NAPROXEN 375 MG PO TABS: 375.0000 mg | ORAL_TABLET | Freq: Two times a day (BID) | ORAL | Status: DC

## 2016-03-05 MED ORDER — CYCLOBENZAPRINE HCL 10 MG PO TABS: ORAL_TABLET | ORAL | Status: DC

## 2016-03-05 MED ORDER — HYDROCODONE-ACETAMINOPHEN 5-325 MG PO TABS: 2.0000 | ORAL_TABLET | Freq: Once | ORAL | Status: DC

## 2016-03-05 NOTE — Discharge Instructions (Signed)
Foot Sprain °A foot sprain is an injury to one of the strong bands of tissue (ligaments) that connect and support the many bones in your feet. The ligament can be stretched too much or it can tear. A tear can be either partial or complete. The severity of the sprain depends on how much of the ligament was damaged or torn. °CAUSES °A foot sprain is usually caused by suddenly twisting or pivoting your foot. °RISK FACTORS °This injury is more likely to occur in people who: °· Play a sport, such as basketball or football. °· Exercise or play a sport without warming up. °· Start a new workout or sport. °· Suddenly increase how long or hard they exercise or play a sport. °SYMPTOMS °Symptoms of this condition start soon after an injury and include: °· Pain, especially in the arch of the foot. °· Bruising. °· Swelling. °· Inability to walk or use the foot to support body weight. °DIAGNOSIS °This condition is diagnosed with a medical history and physical exam. You may also have imaging tests, such as: °· X-rays to make sure there are no broken bones (fractures). °· MRI to see if the ligament has torn. °TREATMENT °Treatment varies depending on the severity of your sprain. Mild sprains can be treated with rest, ice, compression, and elevation (RICE). If your ligament is overstretched or partially torn, treatment usually involves keeping your foot in a fixed position (immobilization) for a period of time. To help you do this, your health care provider will apply a bandage, splint, or walking boot to keep your foot from moving until it heals. You may also be advised to use crutches or a scooter for a few weeks to avoid bearing weight on your foot while it is healing. °If your ligament is fully torn, you may need surgery to reconnect the ligament to the bone. After surgery, a cast or splint will be applied and will need to stay on your foot while it heals. °Your health care provider may also suggest exercises or physical therapy  to strengthen your foot. °HOME CARE INSTRUCTIONS °If You Have a Bandage, Splint, or Walking Boot: °· Wear it as directed by your health care provider. Remove it only as directed by your health care provider. °· Loosen the bandage, splint, or walking boot if your toes become numb and tingle, or if they turn cold and blue. °Bathing °· If your health care provider approves bathing and showering, cover the bandage or splint with a watertight plastic bag to protect it from water. Do not let the bandage or splint get wet. °Managing Pain, Stiffness, and Swelling  °· If directed, apply ice to the injured area: °¨ Put ice in a plastic bag. °¨ Place a towel between your skin and the bag. °¨ Leave the ice on for 20 minutes, 2-3 times per day. °· Move your toes often to avoid stiffness and to lessen swelling. °· Raise (elevate) the injured area above the level of your heart while you are sitting or lying down. °Driving °· Do not drive or operate heavy machinery while taking pain medicine. °· Do not drive while wearing a bandage, splint, or walking boot on a foot that you use for driving. °Activity °· Rest as directed by your health care provider. °· Do not use the injured foot to support your body weight until your health care provider says that you can. Use crutches or other supportive devices as directed by your health care provider. °· Ask your health care   provider what activities are safe for you. Gradually increase how much and how far you walk until your health care provider says it is safe to return to full activity.  Do any exercise or physical therapy as directed by your health care provider. General Instructions  If a splint was applied, do not put pressure on any part of it until it is fully hardened. This may take several hours.  Take medicines only as directed by your health care provider. These include over-the-counter medicines and prescription medicines.  Keep all follow-up visits as directed by your  health care provider. This is important.  When you can walk without pain, wear supportive shoes that have stiff soles. Do not wear flip-flops, and do not walk barefoot. SEEK MEDICAL CARE IF:  Your pain is not controlled with medicine.  Your bruising or swelling gets worse or does not get better with treatment.  Your splint or walking boot is damaged. SEEK IMMEDIATE MEDICAL CARE IF:  Your foot is numb or blue.  Your foot feels colder than normal.   This information is not intended to replace advice given to you by your health care provider. Make sure you discuss any questions you have with your health care provider.   Document Released: 02/09/2002 Document Revised: 01/04/2015 Document Reviewed: 06/23/2014 Elsevier Interactive Patient Education 2016 Elsevier Inc.  Lumbosacral Strain Lumbosacral strain is a strain of any of the parts that make up your lumbosacral vertebrae. Your lumbosacral vertebrae are the bones that make up the lower third of your backbone. Your lumbosacral vertebrae are held together by muscles and tough, fibrous tissue (ligaments).  CAUSES  A sudden blow to your back can cause lumbosacral strain. Also, anything that causes an excessive stretch of the muscles in the low back can cause this strain. This is typically seen when people exert themselves strenuously, fall, lift heavy objects, bend, or crouch repeatedly. RISK FACTORS  Physically demanding work.  Participation in pushing or pulling sports or sports that require a sudden twist of the back (tennis, golf, baseball).  Weight lifting.  Excessive lower back curvature.  Forward-tilted pelvis.  Weak back or abdominal muscles or both.  Tight hamstrings. SIGNS AND SYMPTOMS  Lumbosacral strain may cause pain in the area of your injury or pain that moves (radiates) down your leg.  DIAGNOSIS Your health care provider can often diagnose lumbosacral strain through a physical exam. In some cases, you may need  tests such as X-ray exams.  TREATMENT  Treatment for your lower back injury depends on many factors that your clinician will have to evaluate. However, most treatment will include the use of anti-inflammatory medicines. HOME CARE INSTRUCTIONS   Avoid hard physical activities (tennis, racquetball, waterskiing) if you are not in proper physical condition for it. This may aggravate or create problems.  If you have a back problem, avoid sports requiring sudden body movements. Swimming and walking are generally safer activities.  Maintain good posture.  Maintain a healthy weight.  For acute conditions, you may put ice on the injured area.  Put ice in a plastic bag.  Place a towel between your skin and the bag.  Leave the ice on for 20 minutes, 2-3 times a day.  When the low back starts healing, stretching and strengthening exercises may be recommended. SEEK MEDICAL CARE IF:  Your back pain is getting worse.  You experience severe back pain not relieved with medicines. SEEK IMMEDIATE MEDICAL CARE IF:   You have numbness, tingling, weakness, or problems  with the use of your arms or legs.  There is a change in bowel or bladder control.  You have increasing pain in any area of the body, including your belly (abdomen).  You notice shortness of breath, dizziness, or feel faint.  You feel sick to your stomach (nauseous), are throwing up (vomiting), or become sweaty.  You notice discoloration of your toes or legs, or your feet get very cold. MAKE SURE YOU:   Understand these instructions.  Will watch your condition.  Will get help right away if you are not doing well or get worse.   This information is not intended to replace advice given to you by your health care provider. Make sure you discuss any questions you have with your health care provider.   Document Released: 05/30/2005 Document Revised: 09/10/2014 Document Reviewed: 04/08/2013 Elsevier Interactive Patient Education  2016 Elsevier Inc.  Muscle Cramps and Spasms Muscle cramps and spasms are when muscles tighten by themselves. They usually get better within minutes. Muscle cramps are painful. They are usually stronger and last longer than muscle spasms. Muscle spasms may or may not be painful. They can last a few seconds or much longer. HOME CARE  Drink enough fluid to keep your pee (urine) clear or pale yellow.  Massage, stretch, and relax the muscle.  Use a warm towel, heating pad, or warm shower water on tight muscles.  Place ice on the muscle if it is tender or in pain.  Put ice in a plastic bag.  Place a towel between your skin and the bag.  Leave the ice on for 15-20 minutes, 03-04 times a day.  Only take medicine as told by your doctor. GET HELP RIGHT AWAY IF:  Your cramps or spasms get worse, happen more often, or do not get better with time. MAKE SURE YOU:  Understand these instructions.  Will watch your condition.  Will get help right away if you are not doing well or get worse.   This information is not intended to replace advice given to you by your health care provider. Make sure you discuss any questions you have with your health care provider.   Document Released: 08/02/2008 Document Revised: 12/15/2012 Document Reviewed: 08/06/2012 Elsevier Interactive Patient Education [employer]2016 Elsevier Inc.

## 2016-03-05 NOTE — ED Notes (Signed)
Patient reports left foot pain for one week, thought to be gout.  Patient reports 3 days ago stepping awkwardly and felt pop in foot.  Since then left foot has been swollen, painful, red, warm to touch and has back pain .

## 2016-03-05 NOTE — ED Provider Notes (Signed)
CSN: [id number]     Arrival date & time 03/05/16  [id number] History   First MD Initiated Contact with Patient 03/05/16 1009     Chief Complaint  Patient presents with  . Foot Pain   (Consider location/radiation/quality/duration/timing/severity/associated sxs/prior Treatment) HPI Comments: 55-year-old male states that he was having pain in the plantar aspect of the forefoot for approximately one week. He thought it may be gout but he has never had pain like this located to the area described above. As a result of pain with weightbearing he altered the mechanism of weightbearing and ambulation in such a way that it overstressed a portion of his foot causing a sudden snap and pop and sudden pain to the dorsal aspect of the forefoot causing him to fall and injure his low back. He is complaining of pain to the forefoot dorsal and plantar aspect. He is also complaining of pain to the lower paralumbar musculature. Pain is worse in the back with movement, twisting, bending, pulling and other similar movements. Denies distal weakness, paresthesias.   History reviewed. No pertinent past medical history. Past Surgical History  Procedure Laterality Date  . Fracture surgery     Family History  Problem Relation Age of Onset  . Stroke Mother 1559  . Heart attack Father    Social History  Substance Use Topics  . Smoking status: Current Every Day Smoker -- 0.50 packs/day    Types: Cigarettes    Start date: 09/03/1974  . Smokeless tobacco: Never Used  . Alcohol Use: 0.0 oz/week    0 Standard drinks or equivalent per week     Comment: occasional    Review of Systems  Constitutional: Negative.   HENT: Negative.   Respiratory: Negative.   Gastrointestinal: Negative.   Genitourinary: Negative.   Musculoskeletal: Positive for myalgias and back pain. Negative for neck pain and neck stiffness.       As per HPI  Skin: Negative.  Negative for color change.  Neurological: Negative for dizziness, weakness,  numbness and headaches.  All other systems reviewed and are negative.   Allergies  Review of patient's allergies indicates no known allergies.  Home Medications   Prior to Admission medications   Medication Sig Start Date End Date Taking? Authorizing Provider  colchicine 0.6 MG tablet Take 0.6 mg by mouth daily as needed (for gout flareups). Reported on 10/13/2015 02/16/15   Historical Provider, MD  cyclobenzaprine (FLEXERIL) 10 MG tablet Take one half to one tablet every 8 hours as needed for muscle spasms. May cause drowsiness. 03/05/16   Hayden Rasmussenavid Kaylany Tesoriero, NP  HYDROcodone-acetaminophen (NORCO/VICODIN) 5-325 MG tablet Take 1 tablet by mouth every 4 (four) hours as needed. 03/05/16   Hayden Rasmussenavid Scotty Pinder, NP  naproxen (NAPROSYN) 375 MG tablet Take 1 tablet (375 mg total) by mouth 2 (two) times daily. 03/05/16   Hayden Rasmussenavid Laurann Mcmorris, NP   Meds Ordered and Administered this Visit   Medications  ketorolac (TORADOL) injection 60 mg (not administered)  HYDROcodone-acetaminophen (NORCO/VICODIN) 5-325 MG per tablet 2 tablet (not administered)    BP 157/92 mmHg  Pulse 91  Temp(Src) 97.5 F (36.4 C) (Oral)  Resp 24  SpO2 97% No data found.   Physical Exam  Constitutional: He is oriented to person, place, and time. He appears well-developed and well-nourished. No distress.  HENT:  Head: Normocephalic and atraumatic.  Eyes: Conjunctivae and EOM are normal. Left eye exhibits no discharge.  Neck: Normal range of motion. Neck supple.  Cardiovascular: Normal rate.   Pulmonary/Chest: Effort  normal.  Musculoskeletal:  Tenderness to the lower paralumbar musculature. No direct spinal tenderness. There is palpable muscle spasm to the right paralumbar musculature. No spinal tenderness or palpable step-off deformity. No swelling.  Right foot with swelling to the distal aspect, primarily to the dorsal aspect. There is tenderness to the "ball" of the forefoot on the plantar aspect. No discoloration and no swelling.  Neurological:  He is alert and oriented to person, place, and time. No cranial nerve deficit.  Skin: Skin is warm and dry.  Psychiatric: He has a normal mood and affect.  Nursing note and vitals reviewed.   ED Course  Procedures (including critical care time)  Labs Review Labs Reviewed - No data to display  Imaging Review Dg Foot Complete Left  03/05/2016  CLINICAL DATA:  Left foot pain and swelling, injury 3 days ago EXAM: LEFT FOOT - COMPLETE 3+ VIEW COMPARISON:  None. FINDINGS: Three views of the left foot submitted. No acute fracture or subluxation. Mild soft tissue swelling dorsal metatarsal region. Tiny plantar spur of calcaneus. Mild soft tissue swelling dorsal tarsal region. IMPRESSION: No acute fracture or subluxation. Mild soft tissue swelling dorsal tarsal metatarsal region. Electronically Signed   By: Natasha MeadLiviu  Pop M.D.   On: 03/05/2016 11:07     Visual Acuity Review  Right Eye Distance:   Left Eye Distance:   Bilateral Distance:    Right Eye Near:   Left Eye Near:    Bilateral Near:         MDM   1. Foot sprain, left, initial encounter   2. Foot pain, left   3. Lumbar strain, initial encounter   4. Muscle spasm of back   5. Fall, initial encounter    Suspect patient has a sprain to the dorsum of his foot. Uncertain as to etiology of the pain to the distal most aspect of the plantar foot. He will follow up with PCP and possibly referral to podiatry. Patient has no skeletal skeletal back pain primarily muscle spasms and strain. We will treat with blue medications. We will also provide a compression wrap to the foot. The patient has a special postop shoe and a walker to assist with ambulation. Meds ordered this encounter  Medications  . ketorolac (TORADOL) injection 60 mg    Sig:   . HYDROcodone-acetaminophen (NORCO/VICODIN) 5-325 MG per tablet 2 tablet    Sig:   . HYDROcodone-acetaminophen (NORCO/VICODIN) 5-325 MG tablet    Sig: Take 1 tablet by mouth every 4 (four) hours as  needed.    Dispense:  15 tablet    Refill:  0    Order Specific Question:  Supervising Provider    Answer:  Linna HoffKINDL, JAMES D [phone][[id number]]  . cyclobenzaprine (FLEXERIL) 10 MG tablet    Sig: Take one half to one tablet every 8 hours as needed for muscle spasms. May cause drowsiness.    Dispense:  20 tablet    Refill:  0    Order Specific Question:  Supervising Provider    Answer:  Linna HoffKINDL, JAMES D [phone][[id number]]  . naproxen (NAPROSYN) 375 MG tablet    Sig: Take 1 tablet (375 mg total) by mouth 2 (two) times daily.    Dispense:  20 tablet    Refill:  0    Order Specific Question:  Supervising Provider    Answer:  Linna HoffKINDL, JAMES D [phone][[id number]]   Call PCP Thedore MinsSingh for an appointment.    Hayden Rasmussenavid Roseanna Koplin, NP 03/05/16 1136

## 2016-03-05 NOTE — ED Notes (Signed)
Patient does not have a ride home.  Hydrocodone not given.  Hayden Rasmussenavid Mabe, NP aware med not given and why

## 2016-10-23 DIAGNOSIS — R945 Abnormal results of liver function studies: Secondary | ICD-10-CM | POA: Diagnosis not present

## 2016-10-23 DIAGNOSIS — E782 Mixed hyperlipidemia: Secondary | ICD-10-CM | POA: Diagnosis not present

## 2016-10-23 DIAGNOSIS — M109 Gout, unspecified: Secondary | ICD-10-CM | POA: Diagnosis not present

## 2016-11-13 DIAGNOSIS — B359 Dermatophytosis, unspecified: Secondary | ICD-10-CM | POA: Diagnosis not present

## 2016-11-13 DIAGNOSIS — M549 Dorsalgia, unspecified: Secondary | ICD-10-CM | POA: Diagnosis not present

## 2016-11-13 DIAGNOSIS — K649 Unspecified hemorrhoids: Secondary | ICD-10-CM | POA: Diagnosis not present

## 2016-11-16 DIAGNOSIS — B359 Dermatophytosis, unspecified: Secondary | ICD-10-CM | POA: Diagnosis not present

## 2016-11-16 DIAGNOSIS — K649 Unspecified hemorrhoids: Secondary | ICD-10-CM | POA: Diagnosis not present

## 2016-11-20 ENCOUNTER — Emergency Department (HOSPITAL_COMMUNITY): Payer: Commercial Managed Care - HMO

## 2016-11-20 ENCOUNTER — Observation Stay (HOSPITAL_COMMUNITY)
Admission: EM | Admit: 2016-11-20 | Discharge: 2016-11-21 | Disposition: A | Discharge status: Home / Self Care | Payer: Commercial Managed Care - HMO | Attending: Internal Medicine | Admitting: Internal Medicine

## 2016-11-20 ENCOUNTER — Encounter (HOSPITAL_COMMUNITY): Payer: Self-pay

## 2016-11-20 DIAGNOSIS — K740 Hepatic fibrosis: Secondary | ICD-10-CM | POA: Insufficient documentation

## 2016-11-20 DIAGNOSIS — R6510 Systemic inflammatory response syndrome (SIRS) of non-infectious origin without acute organ dysfunction: Secondary | ICD-10-CM | POA: Diagnosis not present

## 2016-11-20 DIAGNOSIS — J9601 Acute respiratory failure with hypoxia: Secondary | ICD-10-CM | POA: Diagnosis not present

## 2016-11-20 DIAGNOSIS — R509 Fever, unspecified: Secondary | ICD-10-CM | POA: Diagnosis not present

## 2016-11-20 DIAGNOSIS — N289 Disorder of kidney and ureter, unspecified: Secondary | ICD-10-CM | POA: Diagnosis not present

## 2016-11-20 DIAGNOSIS — B182 Chronic viral hepatitis C: Secondary | ICD-10-CM | POA: Diagnosis present

## 2016-11-20 DIAGNOSIS — M109 Gout, unspecified: Secondary | ICD-10-CM | POA: Diagnosis not present

## 2016-11-20 DIAGNOSIS — R0602 Shortness of breath: Secondary | ICD-10-CM | POA: Diagnosis not present

## 2016-11-20 DIAGNOSIS — R0902 Hypoxemia: Secondary | ICD-10-CM | POA: Diagnosis not present

## 2016-11-20 DIAGNOSIS — J449 Chronic obstructive pulmonary disease, unspecified: Secondary | ICD-10-CM | POA: Diagnosis not present

## 2016-11-20 DIAGNOSIS — F17210 Nicotine dependence, cigarettes, uncomplicated: Secondary | ICD-10-CM | POA: Diagnosis not present

## 2016-11-20 DIAGNOSIS — R05 Cough: Secondary | ICD-10-CM | POA: Diagnosis not present

## 2016-11-20 DIAGNOSIS — J209 Acute bronchitis, unspecified: Secondary | ICD-10-CM | POA: Diagnosis present

## 2016-11-20 LAB — HEPATIC FUNCTION PANEL
ALK PHOS: 68 U/L (ref 38–126)
ALT: 22 U/L (ref 17–63)
AST: 26 U/L (ref 15–41)
Albumin: 4.2 g/dL (ref 3.5–5.0)
BILIRUBIN INDIRECT: 0.6 mg/dL (ref 0.3–0.9)
BILIRUBIN TOTAL: 0.7 mg/dL (ref 0.3–1.2)
Bilirubin, Direct: 0.1 mg/dL (ref 0.1–0.5)
Total Protein: 7.9 g/dL (ref 6.5–8.1)

## 2016-11-20 LAB — BASIC METABOLIC PANEL
Anion gap: 12 (ref 5–15)
BUN: 12 mg/dL (ref 6–20)
CALCIUM: 9.7 mg/dL (ref 8.9–10.3)
CHLORIDE: 105 mmol/L (ref 101–111)
CO2: 22 mmol/L (ref 22–32)
CREATININE: 1.26 mg/dL — AB (ref 0.61–1.24)
GFR calc non Af Amer: >60 mL/min (ref >60)
Glucose, Bld: 107 mg/dL — ABNORMAL HIGH (ref 65–99)
Potassium: 3.9 mmol/L (ref 3.5–5.1)
SODIUM: 139 mmol/L (ref 135–145)

## 2016-11-20 LAB — CBC
HCT: 43.2 % (ref 39.0–52.0)
Hemoglobin: 14.7 g/dL (ref 13.0–17.0)
MCH: 32.1 pg (ref 26.0–34.0)
MCHC: 34 g/dL (ref 30.0–36.0)
MCV: 94.3 fL (ref 78.0–100.0)
PLATELETS: 238 10*3/uL (ref 150–400)
RBC: 4.58 MIL/uL (ref 4.22–5.81)
RDW: 12.9 % (ref 11.5–15.5)
WBC: 15.5 10*3/uL — AB (ref 4.0–10.5)

## 2016-11-20 LAB — I-STAT TROPONIN, ED: TROPONIN I, POC: 0 ng/mL (ref 0.00–0.08)

## 2016-11-20 MED ORDER — ALBUTEROL SULFATE (2.5 MG/3ML) 0.083% IN NEBU
2.5000 mg | INHALATION_SOLUTION | Freq: Once | RESPIRATORY_TRACT | Status: AC
  Administered 2016-11-20: 2.5 mg via RESPIRATORY_TRACT
  Filled 2016-11-20: qty 3

## 2016-11-20 MED ORDER — ALBUTEROL SULFATE (2.5 MG/3ML) 0.083% IN NEBU
2.5000 mg | INHALATION_SOLUTION | Freq: Once | RESPIRATORY_TRACT | Status: AC
Start: 2016-11-20 — End: 2016-11-20
  Administered 2016-11-20: 2.5 mg via RESPIRATORY_TRACT
  Filled 2016-11-20: qty 3

## 2016-11-20 MED ORDER — PREDNISONE 20 MG PO TABS
60.0000 mg | ORAL_TABLET | Freq: Once | ORAL | Status: AC
  Administered 2016-11-20: 60 mg via ORAL
  Filled 2016-11-20: qty 3

## 2016-11-20 MED ORDER — IOPAMIDOL (ISOVUE-370) INJECTION 76%
INTRAVENOUS | Status: AC
Start: 2016-11-20 — End: 2016-11-20
  Administered 2016-11-20: 100 mL
  Filled 2016-11-20: qty 100

## 2016-11-20 MED ORDER — IPRATROPIUM-ALBUTEROL 0.5-2.5 (3) MG/3ML IN SOLN
3.0000 mL | Freq: Once | RESPIRATORY_TRACT | Status: AC
  Administered 2016-11-20: 3 mL via RESPIRATORY_TRACT
  Filled 2016-11-20: qty 3

## 2016-11-20 MED ORDER — OSELTAMIVIR PHOSPHATE 75 MG PO CAPS
75.0000 mg | ORAL_CAPSULE | Freq: Once | ORAL | Status: AC
  Administered 2016-11-21: 75 mg via ORAL
  Filled 2016-11-20: qty 1

## 2016-11-20 MED ORDER — SODIUM CHLORIDE 0.9 % IV BOLUS (SEPSIS)
1000.0000 mL | Freq: Once | INTRAVENOUS | Status: AC
  Administered 2016-11-20: 1000 mL via INTRAVENOUS

## 2016-11-20 NOTE — ED Triage Notes (Signed)
Onset yesterday shortness of breath, onset last night cough.  Pt c/o "can't breathe".  Pt is coughing hard causing self to gag.

## 2016-11-20 NOTE — ED Triage Notes (Signed)
Pt sent here from Dr. Duanne Guessewey office for shortness of breath, cough.  Flu test negative, post albuterol treatment O2 sat 89% at rest, gave Rocephin 1 gm IM.

## 2016-11-20 NOTE — ED Provider Notes (Signed)
[facility]-EMERGENCY DEPT Provider Note   CSN: [id number] Arrival date & time: 11/20/16  1508   History   Chief Complaint Chief Complaint  Patient presents with  . Cough  . Shortness of Breath   HPI   Jacob Black is an 56 y.o. male with history of Hep C (now s/p antiviral tx) who presents to the ED for evaluation of SOB. Symptoms started yesterday. States yesterday afternoon he started feeling short of breath. He notes associated cough. He feels short of breath at rest, worse with exertion and speaking. He is taking breaths mid sentence. He saw his PCP earlier today who gave him an albuterol neb and IM rocephin, and sent him to the ED for further evaluation. Pt notes feeling intermittently lightheaded. Denies h/o asthma, COPD, or ever feeling short of breath like this before. He does smoke half ppd. States one week ago he flew to [place] and back. Denies leg pain or swelling.   Patient Active Problem List   Diagnosis Date Noted  . Liver fibrosis (HCC) 10/15/2015  . Chronic hepatitis C without hepatic coma (HCC) 08/10/2015  . Gout 08/10/2015    Past Surgical History:  Procedure Laterality Date  . FRACTURE SURGERY      Home Medications    Prior to Admission medications   Medication Sig Start Date End Date Taking? Authorizing Provider  colchicine 0.6 MG tablet Take 0.6 mg by mouth daily as needed (for gout flareups). Reported on 10/13/2015 02/16/15   Historical Provider, MD  cyclobenzaprine (FLEXERIL) 10 MG tablet Take one half to one tablet every 8 hours as needed for muscle spasms. May cause drowsiness. 03/05/16   Hayden Rasmussenavid Mabe, NP  HYDROcodone-acetaminophen (NORCO/VICODIN) 5-325 MG tablet Take 1 tablet by mouth every 4 (four) hours as needed. 03/05/16   Hayden Rasmussenavid Mabe, NP  naproxen (NAPROSYN) 375 MG tablet Take 1 tablet (375 mg total) by mouth 2 (two) times daily. 03/05/16   Hayden Rasmussenavid Mabe, NP    Family History Family History  Problem Relation Age of Onset  . Stroke Mother 3959  . Heart attack  Father     Social History Social History  Substance Use Topics  . Smoking status: Current Every Day Smoker    Packs/day: 0.50    Types: Cigarettes    Start date: 09/03/1974  . Smokeless tobacco: Never Used  . Alcohol use 0.0 oz/week     Comment: occasional     Allergies   Patient has no known allergies.   Review of Systems Review of Systems 10 Systems reviewed and are negative for acute change except as noted in the HPI.   Physical Exam Updated Vital Signs BP (!) 143/86   Pulse (!) 102   Temp 98.5 F (36.9 C) (Oral)   Resp 20   Ht 5\' 9"  (1.753 m)   Wt 90.7 kg   SpO2 96%   BMI 29.53 kg/m   Physical Exam  Constitutional: He is oriented to person, place, and time. No distress.  HENT:  Right Ear: External ear normal.  Left Ear: External ear normal.  Nose: Nose normal.  Mouth/Throat: Oropharynx is clear and moist. No oropharyngeal exudate.  Eyes: Conjunctivae are normal.  Neck: Neck supple.  Cardiovascular: Normal rate, regular rhythm, normal heart sounds and intact distal pulses.   Pulmonary/Chest:  +tachypnea Pausing to take breaths mid sentence Lungs tight throughout with some bilateral basilar expiratory wheezing and rhonchi  Abdominal: Soft. Bowel sounds are normal. He exhibits no distension. There is no tenderness. There is  no rebound and no guarding.  Musculoskeletal: He exhibits no edema.  Lymphadenopathy:    He has no cervical adenopathy.  Neurological: He is alert and oriented to person, place, and time. No cranial nerve deficit.  Skin: Skin is warm and dry. He is not diaphoretic.  Psychiatric: He has a normal mood and affect.  Nursing note and vitals reviewed.    ED Treatments / Results  Labs (all labs ordered are listed, but only abnormal results are displayed) Labs Reviewed  BASIC METABOLIC PANEL - Abnormal; Notable for the following:       Result Value   Glucose, Bld 107 (*)    Creatinine, Ser 1.26 (*)    All other components within normal  limits  CBC - Abnormal; Notable for the following:    WBC 15.5 (*)    All other components within normal limits  HEPATIC FUNCTION PANEL  INFLUENZA PANEL BY PCR (TYPE A & B)  I-STAT TROPOININ, ED    EKG  EKG Interpretation  Date/Time:  Tuesday November 20 2016 15:46:04 EDT Ventricular Rate:  97 PR Interval:  162 QRS Duration: 90 QT Interval:  334 QTC Calculation: 424 R Axis:   78 Text Interpretation:  Normal sinus rhythm Normal ECG when compared to prior, no significant chagnes.  No STEMI Confirmed by Rush Landmark MD, CHRISTOPHER [phone]) on 11/20/2016 11:08:50 PM       Radiology Dg Chest 2 View  Result Date: 11/20/2016 CLINICAL DATA:  Shortness of breath. EXAM: CHEST  2 VIEW COMPARISON:  None. FINDINGS: The heart size and mediastinal contours are within normal limits. Both lungs are clear. No pneumothorax or pleural effusion is noted. The visualized skeletal structures are unremarkable. IMPRESSION: No active cardiopulmonary disease. Electronically Signed   By: Lupita Raider, M.D.   On: 11/20/2016 16:28    Procedures Procedures (including critical care time)  Medications Ordered in ED Medications  oseltamivir (TAMIFLU) capsule 75 mg (not administered)  magnesium sulfate IVPB 1 g 100 mL (not administered)  predniSONE (DELTASONE) tablet 60 mg (60 mg Oral Given 11/20/16 2125)  ipratropium-albuterol (DUONEB) 0.5-2.5 (3) MG/3ML nebulizer solution 3 mL (3 mLs Nebulization Given 11/20/16 2058)  albuterol (PROVENTIL) (2.5 MG/3ML) 0.083% nebulizer solution 2.5 mg (2.5 mg Nebulization Given 11/20/16 2059)  sodium chloride 0.9 % bolus 1,000 mL (1,000 mLs Intravenous New Bag/Given 11/20/16 2125)  iopamidol (ISOVUE-370) 76 % injection (100 mLs  Contrast Given 11/20/16 2216)  albuterol (PROVENTIL) (2.5 MG/3ML) 0.083% nebulizer solution 2.5 mg (2.5 mg Nebulization Given 11/20/16 2304)  albuterol (PROVENTIL,VENTOLIN) solution continuous neb (10 mg/hr Nebulization Given 11/21/16 0036)    Initial  Impression / Assessment and Plan / ED Course  I have reviewed the triage vital signs and the nursing notes.  Pertinent labs & imaging results that were available during my care of the patient were reviewed by me and considered in my medical decision making (see chart for details).  11:54 PM Workup unrevealing. After breathing tx pt now still with expiratory rhonchi but wheezing resolved. He has not been hypoxic at all. He is still a bit tachypneic. After discussion with my attending physician we'll ambulate and check pulse ox. If no de-sat can dc home. Will start empiric coverage for flu given elevated white count and symptoms.  12:28 AM During ambulation pt felt more dizzy/lightheaded. Back in the room he feels more short of breath again. He is working harder to breathe. His wheezing has returned. He is still rhonchorous. Will start on continuous neb and give a  dose of mag sulfate.  Discussed with my attending physician. Will call hospitalist service for admission for asthma/copd exacerbation.  1:27 AM Dr. Toniann Fail has seen pt and will admit to obs. Appreciate assistance.   Final Clinical Impressions(s) / ED Diagnoses   Final diagnoses:  Shortness of breath  Renal insufficiency  SIRS (systemic inflammatory response syndrome) (HCC)   New Prescriptions New Prescriptions   No medications on file     Carlene Coria, Cordelia Poche 11/21/16 0127    Canary Brim Tegeler, MD 11/21/16 1151

## 2016-11-21 ENCOUNTER — Observation Stay (HOSPITAL_BASED_OUTPATIENT_CLINIC_OR_DEPARTMENT_OTHER): Payer: Commercial Managed Care - HMO

## 2016-11-21 ENCOUNTER — Encounter (HOSPITAL_COMMUNITY): Payer: Self-pay | Admitting: Internal Medicine

## 2016-11-21 DIAGNOSIS — R0600 Dyspnea, unspecified: Secondary | ICD-10-CM | POA: Diagnosis not present

## 2016-11-21 DIAGNOSIS — J9601 Acute respiratory failure with hypoxia: Secondary | ICD-10-CM | POA: Diagnosis present

## 2016-11-21 DIAGNOSIS — B182 Chronic viral hepatitis C: Secondary | ICD-10-CM

## 2016-11-21 DIAGNOSIS — J209 Acute bronchitis, unspecified: Secondary | ICD-10-CM | POA: Diagnosis present

## 2016-11-21 LAB — CBC WITH DIFFERENTIAL/PLATELET
Basophils Absolute: 0 10*3/uL (ref 0.0–0.1)
Basophils Relative: 0 %
EOS ABS: 0 10*3/uL (ref 0.0–0.7)
Eosinophils Relative: 0 %
HEMATOCRIT: 39 % (ref 39.0–52.0)
HEMOGLOBIN: 13.1 g/dL (ref 13.0–17.0)
LYMPHS ABS: 0.3 10*3/uL — AB (ref 0.7–4.0)
LYMPHS PCT: 3 %
MCH: 31.9 pg (ref 26.0–34.0)
MCHC: 33.6 g/dL (ref 30.0–36.0)
MCV: 94.9 fL (ref 78.0–100.0)
MONOS PCT: 1 %
Monocytes Absolute: 0.1 10*3/uL (ref 0.1–1.0)
NEUTROS ABS: 11.3 10*3/uL — AB (ref 1.7–7.7)
NEUTROS PCT: 96 %
Platelets: 203 10*3/uL (ref 150–400)
RBC: 4.11 MIL/uL — AB (ref 4.22–5.81)
RDW: 13.1 % (ref 11.5–15.5)
WBC: 11.7 10*3/uL — AB (ref 4.0–10.5)

## 2016-11-21 LAB — COMPREHENSIVE METABOLIC PANEL
ALT: 21 U/L (ref 17–63)
ANION GAP: 15 (ref 5–15)
AST: 30 U/L (ref 15–41)
Albumin: 3.9 g/dL (ref 3.5–5.0)
Alkaline Phosphatase: 54 U/L (ref 38–126)
BUN: 12 mg/dL (ref 6–20)
CHLORIDE: 100 mmol/L — AB (ref 101–111)
CO2: 19 mmol/L — AB (ref 22–32)
CREATININE: 1.52 mg/dL — AB (ref 0.61–1.24)
Calcium: 9 mg/dL (ref 8.9–10.3)
GFR, EST AFRICAN AMERICAN: 59 mL/min — AB (ref >60)
GFR, EST NON AFRICAN AMERICAN: 51 mL/min — AB (ref >60)
Glucose, Bld: 261 mg/dL — ABNORMAL HIGH (ref 65–99)
POTASSIUM: 3.3 mmol/L — AB (ref 3.5–5.1)
SODIUM: 134 mmol/L — AB (ref 135–145)
Total Bilirubin: 0.4 mg/dL (ref 0.3–1.2)
Total Protein: 7.2 g/dL (ref 6.5–8.1)

## 2016-11-21 LAB — INFLUENZA PANEL BY PCR (TYPE A & B)
Influenza A By PCR: NEGATIVE
Influenza B By PCR: NEGATIVE

## 2016-11-21 LAB — BRAIN NATRIURETIC PEPTIDE: B Natriuretic Peptide: 36.3 pg/mL (ref 0.0–100.0)

## 2016-11-21 LAB — ECHOCARDIOGRAM COMPLETE
HEIGHTINCHES: 68 in
Weight: 3244.8 oz

## 2016-11-21 LAB — TROPONIN I

## 2016-11-21 LAB — HIV ANTIBODY (ROUTINE TESTING W REFLEX): HIV SCREEN 4TH GENERATION: NONREACTIVE

## 2016-11-21 MED ORDER — ONDANSETRON HCL 4 MG PO TABS: 4.0000 mg | ORAL_TABLET | Freq: Four times a day (QID) | ORAL | Status: DC | PRN

## 2016-11-21 MED ORDER — COLCHICINE 0.6 MG PO TABS: 0.6000 mg | ORAL_TABLET | Freq: Every day | ORAL | Status: DC | PRN

## 2016-11-21 MED ORDER — PREDNISONE 20 MG PO TABS: ORAL_TABLET | ORAL | 0 refills | Status: DC

## 2016-11-21 MED ORDER — MAGNESIUM SULFATE IN D5W 1-5 GM/100ML-% IV SOLN
1.0000 g | Freq: Once | INTRAVENOUS | Status: AC
  Administered 2016-11-21: 1 g via INTRAVENOUS
  Filled 2016-11-21 (×2): qty 100

## 2016-11-21 MED ORDER — FEBUXOSTAT 40 MG PO TABS
40.0000 mg | ORAL_TABLET | Freq: Every day | ORAL | Status: DC
  Administered 2016-11-21: 40 mg via ORAL
  Filled 2016-11-21: qty 1

## 2016-11-21 MED ORDER — ALBUTEROL (5 MG/ML) CONTINUOUS INHALATION SOLN
10.0000 mg/h | INHALATION_SOLUTION | Freq: Once | RESPIRATORY_TRACT | Status: AC
Start: 2016-11-21 — End: 2016-11-21
  Administered 2016-11-21: 10 mg/h via RESPIRATORY_TRACT
  Filled 2016-11-21: qty 20

## 2016-11-21 MED ORDER — INFLUENZA VAC SPLIT QUAD 0.5 ML IM SUSY: 0.5000 mL | PREFILLED_SYRINGE | INTRAMUSCULAR | Status: DC

## 2016-11-21 MED ORDER — ALBUTEROL SULFATE (2.5 MG/3ML) 0.083% IN NEBU
2.5000 mg | INHALATION_SOLUTION | RESPIRATORY_TRACT | Status: DC
  Administered 2016-11-21 (×4): 2.5 mg via RESPIRATORY_TRACT
  Filled 2016-11-21 (×5): qty 3

## 2016-11-21 MED ORDER — ENOXAPARIN SODIUM 40 MG/0.4ML ~~LOC~~ SOLN
40.0000 mg | Freq: Every day | SUBCUTANEOUS | Status: DC
  Administered 2016-11-21: 40 mg via SUBCUTANEOUS
  Filled 2016-11-21: qty 0.4

## 2016-11-21 MED ORDER — ALBUTEROL SULFATE (2.5 MG/3ML) 0.083% IN NEBU: 2.5000 mg | INHALATION_SOLUTION | RESPIRATORY_TRACT | Status: DC | PRN

## 2016-11-21 MED ORDER — METHYLPREDNISOLONE SODIUM SUCC 40 MG IJ SOLR
40.0000 mg | Freq: Two times a day (BID) | INTRAMUSCULAR | Status: DC
  Administered 2016-11-21: 40 mg via INTRAVENOUS
  Filled 2016-11-21 (×2): qty 1

## 2016-11-21 MED ORDER — ALBUTEROL SULFATE HFA 108 (90 BASE) MCG/ACT IN AERS: 2.0000 | INHALATION_SPRAY | Freq: Four times a day (QID) | RESPIRATORY_TRACT | 0 refills | Status: DC | PRN

## 2016-11-21 MED ORDER — ACETAMINOPHEN 650 MG RE SUPP: 650.0000 mg | Freq: Four times a day (QID) | RECTAL | Status: DC | PRN

## 2016-11-21 MED ORDER — DOXYCYCLINE HYCLATE 100 MG PO TABS
100.0000 mg | ORAL_TABLET | Freq: Two times a day (BID) | ORAL | Status: DC
  Administered 2016-11-21: 100 mg via ORAL
  Filled 2016-11-21: qty 1

## 2016-11-21 MED ORDER — ONDANSETRON HCL 4 MG/2ML IJ SOLN: 4.0000 mg | Freq: Four times a day (QID) | INTRAMUSCULAR | Status: DC | PRN

## 2016-11-21 MED ORDER — LEVOFLOXACIN 500 MG PO TABS: 500.0000 mg | ORAL_TABLET | Freq: Every day | ORAL | 0 refills | Status: DC

## 2016-11-21 MED ORDER — ACETAMINOPHEN 325 MG PO TABS: 650.0000 mg | ORAL_TABLET | Freq: Four times a day (QID) | ORAL | Status: DC | PRN

## 2016-11-21 MED ORDER — BUDESONIDE 0.25 MG/2ML IN SUSP
0.2500 mg | Freq: Two times a day (BID) | RESPIRATORY_TRACT | Status: DC
  Administered 2016-11-21: 0.25 mg via RESPIRATORY_TRACT
  Filled 2016-11-21 (×2): qty 2

## 2016-11-21 MED ORDER — ALPRAZOLAM 0.25 MG PO TABS: 0.5000 mg | ORAL_TABLET | Freq: Every day | ORAL | Status: DC | PRN

## 2016-11-21 MED ORDER — DOXYCYCLINE HYCLATE 100 MG IV SOLR
100.0000 mg | Freq: Two times a day (BID) | INTRAVENOUS | Status: DC
  Administered 2016-11-21: 100 mg via INTRAVENOUS
  Filled 2016-11-21: qty 100

## 2016-11-21 NOTE — ED Notes (Signed)
During ambulation, saturations maintained 94-95% hr 110-119. Pt did not c/o sob, only dizziness. PA made aware of the same

## 2016-11-21 NOTE — Progress Notes (Signed)
Page to Dr Joseph.   TheadorJomarie Longs [id number]. EF 65-70%, Echo Report available.

## 2016-11-21 NOTE — Progress Notes (Signed)
  Echocardiogram 2D Echocardiogram has been performed.  Arvil ChacoFoster, Adrinne Sze 11/21/2016, 12:32 PM

## 2016-11-21 NOTE — Progress Notes (Signed)
Inpatient Diabetes Program Recommendations  AACE/ADA: New Consensus Statement on Inpatient Glycemic Control (2015)  Target Ranges:  Prepandial:   less than 140 mg/dL      Peak postprandial:   less than 180 mg/dL (1-2 hours)      Critically ill patients:  140 - 180 mg/dL   Review of Glycemic Control Results for Jacob Black, Brain R (MRN [id number]) as of 11/21/2016 11:16  Ref. Range 11/20/2016 15:40 11/21/2016 03:52  Glucose Latest Ref Range: 65 - 99 mg/dL 409107 (H) 811261 (H)   Diabetes history: No  Inpatient Diabetes Program Recommendations:  While patient on steroids please consider: -Glycemic control order set -Novolog correction 0-9 units tid + 0-5 units hs -A1c to determine prehospital glycemic control  Thank you, Jacob HongJudy E. Aashish Hamm, RN, MSN, CDE Inpatient Glycemic Control Team Team Pager [phone] (8am-5pm) 11/21/2016 11:18 AM

## 2016-11-21 NOTE — Progress Notes (Signed)
Per Dr Jomarie LongsJoseph, pt's echo appears normal, and he will be discharged today. Pt is pleased with this plant.

## 2016-11-21 NOTE — Progress Notes (Signed)
Pt seen and examined Admitted this am by Dr.Kakrakandy [name] with Tobacco abuse 1ppd for 40years admitted with productive cough, wheezing, dyspnea Suspect COPD exacerbation -improving with IV solumedrol, nebs, Abx -will FU ECHO -recommended Pulm FU for COPD eval  Zannie CovePreetha Mikale Silversmith, MD

## 2016-11-21 NOTE — H&P (Signed)
History and Physical    Jacob Black:[id number] DOB: 11/27/1960 DOA: 11/20/2016  PCP: No PCP Per Patient  Patient coming from: Home.  Chief Complaint: Shortness of breath.  HPI: Jacob Black is a 56 y.o. male with tobacco abuse and treated hepatitis C and history of gout presents to the ER because of shortness of breath. Patient has been having shortness of breath and wheezing last 24 hours with productive cough. Patient was treated for influenza last month. Denies any chest pain fever or chills. In the patient was getting hypoxic on exertion.   ED Course: CT angiogram of the chest was negative for PE. On exam patient was wheezing and was placed on nebulizer treatment and steroids. Patient easily desaturates on exertion.  Review of Systems: As per HPI, rest all negative.   History reviewed. No pertinent past medical history.  Past Surgical History:  Procedure Laterality Date  . FRACTURE SURGERY       reports that he has been smoking Cigarettes.  He started smoking about 42 years ago. He has been smoking about 0.50 packs per day. He has never used smokeless tobacco. He reports that he drinks alcohol. He reports that he does not use drugs.  No Known Allergies  Family History  Problem Relation Age of Onset  . Stroke Mother 62  . Heart attack Father     Prior to Admission medications   Medication Sig Start Date End Date Taking? Authorizing Provider  acetaminophen (TYLENOL) 500 MG tablet Take 1,000 mg by mouth every 6 (six) hours as needed for moderate pain.   Yes Historical Provider, MD  ALPRAZolam Prudy Feeler) 0.5 MG tablet Take 0.5 mg by mouth See admin instructions. FOR PROCEDURES 10/25/16  Yes Historical Provider, MD  colchicine 0.6 MG tablet Take 0.6 mg by mouth daily as needed (for gout flareups). Reported on 10/13/2015 02/16/15  Yes Historical Provider, MD  nystatin ointment (MYCOSTATIN) Apply 1 application topically 2 (two) times daily.   Yes Historical Provider, MD    PROCTOSOL HC 2.5 % rectal cream Apply 1 application topically daily as needed for irritation. 11/13/16  Yes Historical Provider, MD  azithromycin (ZITHROMAX) 250 MG tablet Take 250 mg by mouth daily. 11/20/16   Historical Provider, MD  benzonatate (TESSALON) 200 MG capsule Take 200 mg by mouth 3 (three) times daily as needed for cough.  11/20/16   Historical Provider, MD  febuxostat (ULORIC) 40 MG tablet Take 40 mg by mouth daily.    Historical Provider, MD  oseltamivir (TAMIFLU) 75 MG capsule Take 75 mg by mouth 2 (two) times daily. 11/20/16   Historical Provider, MD    Physical Exam: Vitals:   11/21/16 0132 11/21/16 0145 11/21/16 0215 11/21/16 0230  BP: (!) 162/82 127/82 (!) 93/58 138/71  Pulse: (!) 101 99 (!) 101 (!) 102  Resp: (!) 23 (!) 24 (!) 24 18  Temp:      TempSrc:      SpO2: 98% 98% 94% 93%  Weight:      Height:          Constitutional: Moderately built and nourished. Vitals:   11/21/16 0132 11/21/16 0145 11/21/16 0215 11/21/16 0230  BP: (!) 162/82 127/82 (!) 93/58 138/71  Pulse: (!) 101 99 (!) 101 (!) 102  Resp: (!) 23 (!) 24 (!) 24 18  Temp:      TempSrc:      SpO2: 98% 98% 94% 93%  Weight:      Height:  Eyes: Anicteric no pallor. ENMT: No discharge from the ears eyes nose and mouth. Neck: No JVD appreciated no mass felt. Respiratory: Mild expiratory wheezes heard bilaterally. No crepitations. Cardiovascular: S1-S2 heard no murmurs appreciated. Abdomen: Soft nontender bowel sounds present. Musculoskeletal: No edema. No joint effusion. Skin: No rash. Skin appears warm. Neurologic: Alert awake oriented to time place and person. Moves all extremities. Psychiatric: Appears normal. Normal affect.   Labs on Admission: I have personally reviewed following labs and imaging studies  CBC:  Recent Labs Lab 11/20/16 1540  WBC 15.5*  HGB 14.7  HCT 43.2  MCV 94.3  PLT 238   Basic Metabolic Panel:  Recent Labs Lab 11/20/16 1540  NA 139  K 3.9  CL 105   CO2 22  GLUCOSE 107*  BUN 12  CREATININE 1.26*  CALCIUM 9.7   GFR: Estimated Creatinine Clearance: 75.5 mL/min (A) (by C-G formula based on SCr of 1.26 mg/dL (H)). Liver Function Tests:  Recent Labs Lab 11/20/16 1540  AST 26  ALT 22  ALKPHOS 68  BILITOT 0.7  PROT 7.9  ALBUMIN 4.2   No results for input(s): LIPASE, AMYLASE in the last 168 hours. No results for input(s): AMMONIA in the last 168 hours. Coagulation Profile: No results for input(s): INR, PROTIME in the last 168 hours. Cardiac Enzymes: No results for input(s): CKTOTAL, CKMB, CKMBINDEX, TROPONINI in the last 168 hours. BNP (last 3 results) No results for input(s): PROBNP in the last 8760 hours. HbA1C: No results for input(s): HGBA1C in the last 72 hours. CBG: No results for input(s): GLUCAP in the last 168 hours. Lipid Profile: No results for input(s): CHOL, HDL, LDLCALC, TRIG, CHOLHDL, LDLDIRECT in the last 72 hours. Thyroid Function Tests: No results for input(s): TSH, T4TOTAL, FREET4, T3FREE, THYROIDAB in the last 72 hours. Anemia Panel: No results for input(s): VITAMINB12, FOLATE, FERRITIN, TIBC, IRON, RETICCTPCT in the last 72 hours. Urine analysis:    Component Value Date/Time   COLORURINE YELLOW 04/25/2015 1837   APPEARANCEUR CLEAR 04/25/2015 1837   LABSPEC 1.028 04/25/2015 1837   PHURINE 5.5 04/25/2015 1837   GLUCOSEU NEGATIVE 04/25/2015 1837   HGBUR NEGATIVE 04/25/2015 1837   BILIRUBINUR NEGATIVE 04/25/2015 1837   KETONESUR 15 (A) 04/25/2015 1837   PROTEINUR NEGATIVE 04/25/2015 1837   UROBILINOGEN 0.2 04/25/2015 1837   NITRITE NEGATIVE 04/25/2015 1837   LEUKOCYTESUR NEGATIVE 04/25/2015 1837   Sepsis Labs: @LABRCNTIP (procalcitonin:4,lacticidven:4) )No results found for this or any previous visit (from the past 240 hour(s)).   Radiological Exams on Admission: Dg Chest 2 View  Result Date: 11/20/2016 CLINICAL DATA:  Shortness of breath. EXAM: CHEST  2 VIEW COMPARISON:  None. FINDINGS: The  heart size and mediastinal contours are within normal limits. Both lungs are clear. No pneumothorax or pleural effusion is noted. The visualized skeletal structures are unremarkable. IMPRESSION: No active cardiopulmonary disease. Electronically Signed   By: Lupita Raider, M.D.   On: 11/20/2016 16:28   Ct Angio Chest Pe W And/or Wo Contrast  Result Date: 11/20/2016 CLINICAL DATA:  Acute onset of shortness of breath. Initial encounter. EXAM: CT ANGIOGRAPHY CHEST WITH CONTRAST TECHNIQUE: Multidetector CT imaging of the chest was performed using the standard protocol during bolus administration of intravenous contrast. Multiplanar CT image reconstructions and MIPs were obtained to evaluate the vascular anatomy. CONTRAST:  66 mL of Isovue 370 IV contrast COMPARISON:  Chest radiograph performed earlier today at 4:20 p.m. FINDINGS: Cardiovascular:  There is no evidence of pulmonary embolus. The heart is normal  in size. The thoracic aorta is unremarkable. The great vessels are within normal limits. Mediastinum/Nodes: There appears to be a 1.6 cm subcarinal node. No additional mediastinal lymphadenopathy is seen. No pericardial effusion is identified. The visualized portions of the thyroid gland are unremarkable. No axillary lymphadenopathy is appreciated. Lungs/Pleura: The lungs are clear bilaterally. No focal consolidation, pleural effusion or pneumothorax is seen. No masses are identified. Upper Abdomen: The mildly nodular contour of the liver may reflect changes of hepatic cirrhosis. The visualized portions of the spleen and pancreas are unremarkable. The visualized portions of the adrenal glands and kidneys are within normal limits. Musculoskeletal: No acute osseous abnormalities are identified. The visualized musculature is unremarkable in appearance. Review of the MIP images confirms the above findings. IMPRESSION: 1. No evidence of pulmonary embolus. 2. Lungs clear bilaterally. 3. 1.6 cm subcarinal node, of  uncertain significance. 4. Mildly nodular contour of the liver may reflect mild hepatic cirrhosis. Electronically Signed   By: Roanna RaiderJeffery  Chang M.D.   On: 11/20/2016 22:58    EKG: Independently reviewed. Normal sinus rhythm.  Assessment/Plan Principal Problem:   Acute respiratory failure with hypoxia (HCC) Active Problems:   Chronic hepatitis C without hepatic coma (HCC)   Acute bronchitis    1. Acute hypoxemic respiratory failure secondary to acute bronchitis - patient has been placed on IV steroids and nebulizer treatment and Pulmicort. I have also place patient on doxycycline. Influenza PCR was negative. Check BNP and also 2-D echo since patient has exertional symptoms. 2. Subcarinal node - will need follow-up as outpatient. 3. History of gout - continue Uloric. 4. History of treated hepatitis C. 5. Tobacco abuse - tobacco cessation counseling requested.   DVT prophylaxis: Lovenox. Code Status: Full code.  Family Communication: Discussed with patient.  Disposition Plan: Home.  Consults called: None.  Admission status: Observation.    Eduard ClosKAKRAKANDY,Kenechukwu Eckstein N. MD [facility] Pager [phone].  If 7PM-7AM, please contact night-coverage [email or web address] Password [email or web address]  11/21/2016, 3:22 AM

## 2016-11-21 NOTE — Progress Notes (Signed)
Patient's telemetry is discontinued, CCMD notified.   pts IV removed without complications.

## 2016-11-23 NOTE — Discharge Summary (Signed)
Physician Discharge Summary  Jacob Black:[id number] DOB: [birth year] DOA: 11/20/2016  PCP: No PCP Per Patient  Admit date: 11/20/2016 Discharge date: 11/21/2016  Time spent: 35 minutes  Recommendations for Outpatient Follow-up:  1. PCP in 1 week, Pulm FU in 1 week to eval for COPD for FU of subcarinal lymph node 2.   Has 1.6cm Subcarinal Lymph node that needs follow up imaging in 4-6weeks  Discharge Diagnoses:    Acute Bronchitis   Suspected COPD exacerbation   Acute respiratory failure with hypoxia (HCC)   Chronic hepatitis C    Tobacco abuse   Discharge Condition: stable  Diet recommendation: heart healthy  Filed Weights   11/20/16 1542 11/21/16 0341  Weight: 90.7 kg (200 lb) 92 kg (202 lb 12.8 oz)    History of present illness:  Jacob Black with Tobacco abuse 1ppd for 40years admitted with productive cough, wheezing, dyspnea  Hospital Course:  Jacob Black with Tobacco abuse 1ppd for 40years admitted with productive cough, wheezing, dyspnea Suspect COPD exacerbation, CXR was negative for acute findings -improved with IV solumedrol, nebs, antibiotics, discharged home on prednisone, abx -also had 2D ECHO ordered by admitting MD which was unremarkable -recommended to have Pulm FU for COPD eval, advised smoking cessation and also had a subcarinal lymph node noted on CT chest that needs follow up  Procedures: ECHo: - Normal LV size with EF 65-70%. There was an LV mid-cavity   gradient noted, peak 37 mmHg at rest. Normal RV size and systolic   function. No significant valvular abnormalities.   Discharge Exam: Vitals:   11/21/16 0341 11/21/16 1217  BP: 130/70 (!) 145/68  Pulse: 99 100  Resp: 20 20  Temp: 97.8 F (36.6 C) 98.3 F (36.8 C)    General: AAOx3 Cardiovascular: S1S2/RRR Respiratory: CTAB  Discharge Instructions   Discharge Instructions    Diet - low sodium heart healthy    Complete by:  As directed    Increase activity slowly    Complete by:  As  directed      Discharge Medication List as of 11/21/2016  6:37 PM    START taking these medications   Details  albuterol (PROVENTIL HFA;VENTOLIN HFA) 108 (90 Base) MCG/ACT inhaler Inhale 2 puffs into the lungs every 6 (six) hours as needed for wheezing or shortness of breath., Starting Wed 11/21/2016, Print    levofloxacin (LEVAQUIN) 500 MG tablet Take 1 tablet (500 mg total) by mouth daily. For 3days, Starting Wed 11/21/2016, Print    predniSONE (DELTASONE) 20 MG tablet Take 40mg  for 2days then 20mg  for 2days then STOP, Print      CONTINUE these medications which have NOT CHANGED   Details  acetaminophen (TYLENOL) 500 MG tablet Take 1,000 mg by mouth every 6 (six) hours as needed for moderate pain., Historical Med    ALPRAZolam (XANAX) 0.5 MG tablet Take 0.5 mg by mouth See admin instructions. FOR PROCEDURES, Starting Thu 10/25/2016, Historical Med    colchicine 0.6 MG tablet Take 0.6 mg by mouth daily as needed (for gout flareups). Reported on 10/13/2015, Starting Wed 02/16/2015, Historical Med    nystatin ointment (MYCOSTATIN) Apply 1 application topically 2 (two) times daily., Historical Med    benzonatate (TESSALON) 200 MG capsule Take 200 mg by mouth 3 (three) times daily as needed for cough. , Starting Tue 11/20/2016, Historical Med    febuxostat (ULORIC) 40 MG tablet Take 40 mg by mouth daily., Historical Med      STOP taking these medications  PROCTOSOL HC 2.5 % rectal cream      azithromycin (ZITHROMAX) 250 MG tablet      oseltamivir (TAMIFLU) 75 MG capsule        No Known Allergies Follow-up Information    PCP Follow up in 1 week(s).   Why:  Need Pulmonary( Lung doctor) referral for non urgent Follow up for suspected COPD           The results of significant diagnostics from this hospitalization (including imaging, microbiology, ancillary and laboratory) are listed below for reference.    Significant Diagnostic Studies: Dg Chest 2 View  Result Date:  11/20/2016 CLINICAL DATA:  Shortness of breath. EXAM: CHEST  2 VIEW COMPARISON:  None. FINDINGS: The heart size and mediastinal contours are within normal limits. Both lungs are clear. No pneumothorax or pleural effusion is noted. The visualized skeletal structures are unremarkable. IMPRESSION: No active cardiopulmonary disease. Electronically Signed   By: Lupita Raider, M.D.   On: 11/20/2016 16:28   Ct Angio Chest Pe W And/or Wo Contrast  Result Date: 11/20/2016 CLINICAL DATA:  Acute onset of shortness of breath. Initial encounter. EXAM: CT ANGIOGRAPHY CHEST WITH CONTRAST TECHNIQUE: Multidetector CT imaging of the chest was performed using the standard protocol during bolus administration of intravenous contrast. Multiplanar CT image reconstructions and MIPs were obtained to evaluate the vascular anatomy. CONTRAST:  66 mL of Isovue 370 IV contrast COMPARISON:  Chest radiograph performed earlier today at 4:20 p.m. FINDINGS: Cardiovascular:  There is no evidence of pulmonary embolus. The heart is normal in size. The thoracic aorta is unremarkable. The great vessels are within normal limits. Mediastinum/Nodes: There appears to be a 1.6 cm subcarinal node. No additional mediastinal lymphadenopathy is seen. No pericardial effusion is identified. The visualized portions of the thyroid gland are unremarkable. No axillary lymphadenopathy is appreciated. Lungs/Pleura: The lungs are clear bilaterally. No focal consolidation, pleural effusion or pneumothorax is seen. No masses are identified. Upper Abdomen: The mildly nodular contour of the liver may reflect changes of hepatic cirrhosis. The visualized portions of the spleen and pancreas are unremarkable. The visualized portions of the adrenal glands and kidneys are within normal limits. Musculoskeletal: No acute osseous abnormalities are identified. The visualized musculature is unremarkable in appearance. Review of the MIP images confirms the above findings.  IMPRESSION: 1. No evidence of pulmonary embolus. 2. Lungs clear bilaterally. 3. 1.6 cm subcarinal node, of uncertain significance. 4. Mildly nodular contour of the liver may reflect mild hepatic cirrhosis. Electronically Signed   By: Roanna Raider M.D.   On: 11/20/2016 22:58    Microbiology: No results found for this or any previous visit (from the past 240 hour(s)).   Labs: Basic Metabolic Panel:  Recent Labs Lab 11/20/16 1540 11/21/16 0352  NA 139 134*  K 3.9 3.3*  CL 105 100*  CO2 22 19*  GLUCOSE 107* 261*  BUN 12 12  CREATININE 1.26* 1.52*  CALCIUM 9.7 9.0   Liver Function Tests:  Recent Labs Lab 11/20/16 1540 11/21/16 0352  AST 26 30  ALT 22 21  ALKPHOS 68 54  BILITOT 0.7 0.4  PROT 7.9 7.2  ALBUMIN 4.2 3.9   No results for input(s): LIPASE, AMYLASE in the last 168 hours. No results for input(s): AMMONIA in the last 168 hours. CBC:  Recent Labs Lab 11/20/16 1540 11/21/16 0352  WBC 15.5* 11.7*  NEUTROABS  --  11.3*  HGB 14.7 13.1  HCT 43.2 39.0  MCV 94.3 94.9  PLT 238 203  Cardiac Enzymes:  Recent Labs Lab 11/21/16 0352  TROPONINI <0.03   BNP: BNP (last 3 results)  Recent Labs  11/21/16 0352  BNP 36.3    ProBNP (last 3 results) No results for input(s): PROBNP in the last 8760 hours.  CBG: No results for input(s): GLUCAP in the last 168 hours.     SignedZannie Cove:  Eriel Doyon MD.  [facility] 11/23/2016, 2:52 PM

## 2017-01-08 DIAGNOSIS — M109 Gout, unspecified: Secondary | ICD-10-CM | POA: Diagnosis not present

## 2017-01-08 DIAGNOSIS — E782 Mixed hyperlipidemia: Secondary | ICD-10-CM | POA: Diagnosis not present

## 2017-01-10 DIAGNOSIS — M109 Gout, unspecified: Secondary | ICD-10-CM | POA: Diagnosis not present

## 2017-01-10 DIAGNOSIS — E782 Mixed hyperlipidemia: Secondary | ICD-10-CM | POA: Diagnosis not present

## 2017-04-29 DIAGNOSIS — Z0000 Encounter for general adult medical examination without abnormal findings: Secondary | ICD-10-CM | POA: Diagnosis not present

## 2017-04-29 DIAGNOSIS — Z125 Encounter for screening for malignant neoplasm of prostate: Secondary | ICD-10-CM | POA: Diagnosis not present

## 2017-04-30 DIAGNOSIS — Z0000 Encounter for general adult medical examination without abnormal findings: Secondary | ICD-10-CM | POA: Diagnosis not present

## 2017-04-30 DIAGNOSIS — Z125 Encounter for screening for malignant neoplasm of prostate: Secondary | ICD-10-CM | POA: Diagnosis not present

## 2017-07-30 DIAGNOSIS — M109 Gout, unspecified: Secondary | ICD-10-CM | POA: Diagnosis not present

## 2017-07-30 DIAGNOSIS — R945 Abnormal results of liver function studies: Secondary | ICD-10-CM | POA: Diagnosis not present

## 2017-07-30 DIAGNOSIS — E782 Mixed hyperlipidemia: Secondary | ICD-10-CM | POA: Diagnosis not present

## 2017-08-01 DIAGNOSIS — I10 Essential (primary) hypertension: Secondary | ICD-10-CM | POA: Diagnosis not present

## 2017-08-01 DIAGNOSIS — M109 Gout, unspecified: Secondary | ICD-10-CM | POA: Diagnosis not present

## 2017-08-01 DIAGNOSIS — E782 Mixed hyperlipidemia: Secondary | ICD-10-CM | POA: Diagnosis not present

## 2017-11-27 DIAGNOSIS — M109 Gout, unspecified: Secondary | ICD-10-CM | POA: Diagnosis not present

## 2017-11-27 DIAGNOSIS — E782 Mixed hyperlipidemia: Secondary | ICD-10-CM | POA: Diagnosis not present

## 2017-11-27 DIAGNOSIS — I10 Essential (primary) hypertension: Secondary | ICD-10-CM | POA: Diagnosis not present

## 2017-11-29 DIAGNOSIS — E782 Mixed hyperlipidemia: Secondary | ICD-10-CM | POA: Diagnosis not present

## 2017-11-29 DIAGNOSIS — M109 Gout, unspecified: Secondary | ICD-10-CM | POA: Diagnosis not present

## 2017-12-06 DIAGNOSIS — M109 Gout, unspecified: Secondary | ICD-10-CM | POA: Diagnosis not present

## 2018-03-12 DIAGNOSIS — R030 Elevated blood-pressure reading, without diagnosis of hypertension: Secondary | ICD-10-CM | POA: Diagnosis not present

## 2018-03-12 DIAGNOSIS — E782 Mixed hyperlipidemia: Secondary | ICD-10-CM | POA: Diagnosis not present

## 2018-03-12 DIAGNOSIS — M109 Gout, unspecified: Secondary | ICD-10-CM | POA: Diagnosis not present

## 2018-03-12 DIAGNOSIS — I10 Essential (primary) hypertension: Secondary | ICD-10-CM | POA: Diagnosis not present

## 2018-03-14 DIAGNOSIS — E782 Mixed hyperlipidemia: Secondary | ICD-10-CM | POA: Diagnosis not present

## 2018-03-14 DIAGNOSIS — R7989 Other specified abnormal findings of blood chemistry: Secondary | ICD-10-CM | POA: Diagnosis not present

## 2018-03-14 DIAGNOSIS — M109 Gout, unspecified: Secondary | ICD-10-CM | POA: Diagnosis not present

## 2018-06-13 DIAGNOSIS — Z6831 Body mass index (BMI) 31.0-31.9, adult: Secondary | ICD-10-CM | POA: Diagnosis not present

## 2018-06-13 DIAGNOSIS — E782 Mixed hyperlipidemia: Secondary | ICD-10-CM | POA: Diagnosis not present

## 2018-06-13 DIAGNOSIS — M109 Gout, unspecified: Secondary | ICD-10-CM | POA: Diagnosis not present

## 2018-06-13 DIAGNOSIS — R252 Cramp and spasm: Secondary | ICD-10-CM | POA: Diagnosis not present

## 2018-06-30 DIAGNOSIS — M109 Gout, unspecified: Secondary | ICD-10-CM | POA: Diagnosis not present

## 2018-07-18 DIAGNOSIS — E782 Mixed hyperlipidemia: Secondary | ICD-10-CM | POA: Diagnosis not present

## 2018-07-18 DIAGNOSIS — M109 Gout, unspecified: Secondary | ICD-10-CM | POA: Diagnosis not present

## 2018-07-18 DIAGNOSIS — M791 Myalgia, unspecified site: Secondary | ICD-10-CM | POA: Diagnosis not present

## 2018-07-21 DIAGNOSIS — M109 Gout, unspecified: Secondary | ICD-10-CM | POA: Diagnosis not present

## 2018-07-21 DIAGNOSIS — E782 Mixed hyperlipidemia: Secondary | ICD-10-CM | POA: Diagnosis not present

## 2018-07-21 DIAGNOSIS — M791 Myalgia, unspecified site: Secondary | ICD-10-CM | POA: Diagnosis not present

## 2018-08-12 DIAGNOSIS — M109 Gout, unspecified: Secondary | ICD-10-CM | POA: Diagnosis not present

## 2018-08-21 DIAGNOSIS — Z6830 Body mass index (BMI) 30.0-30.9, adult: Secondary | ICD-10-CM | POA: Diagnosis not present

## 2018-08-21 DIAGNOSIS — M109 Gout, unspecified: Secondary | ICD-10-CM | POA: Diagnosis not present

## 2018-10-14 DIAGNOSIS — M109 Gout, unspecified: Secondary | ICD-10-CM | POA: Diagnosis not present

## 2018-11-19 DIAGNOSIS — R030 Elevated blood-pressure reading, without diagnosis of hypertension: Secondary | ICD-10-CM | POA: Diagnosis not present

## 2018-11-19 DIAGNOSIS — J069 Acute upper respiratory infection, unspecified: Secondary | ICD-10-CM | POA: Diagnosis not present

## 2018-11-19 DIAGNOSIS — R509 Fever, unspecified: Secondary | ICD-10-CM | POA: Diagnosis not present

## 2019-03-15 ENCOUNTER — Other Ambulatory Visit: Payer: Self-pay

## 2019-03-15 ENCOUNTER — Ambulatory Visit (HOSPITAL_COMMUNITY)
Admission: EM | Admit: 2019-03-15 | Discharge: 2019-03-15 | Disposition: A | Discharge status: Home / Self Care | Payer: 59

## 2019-03-15 ENCOUNTER — Encounter (HOSPITAL_COMMUNITY): Payer: Self-pay | Admitting: *Deleted

## 2019-03-15 DIAGNOSIS — L03116 Cellulitis of left lower limb: Secondary | ICD-10-CM

## 2019-03-15 HISTORY — DX: Gout, unspecified: M10.9

## 2019-03-15 MED ORDER — CEPHALEXIN 500 MG PO CAPS: 500.0000 mg | ORAL_CAPSULE | Freq: Four times a day (QID) | ORAL | 0 refills | Status: AC

## 2019-03-15 MED ORDER — HYDROCODONE-ACETAMINOPHEN 5-325 MG PO TABS: 1.0000 | ORAL_TABLET | Freq: Four times a day (QID) | ORAL | 0 refills | Status: DC | PRN

## 2019-03-15 NOTE — Discharge Instructions (Signed)
Please begin taking Keflex 4 times a day for the next week Tylenol and ibuprofen for mild to moderate pain, pain during the day May use hydrocodone for severe pain, nighttime pain, will cause drowsiness, do not drive or work after taking Keep foot elevated, apply ice to further help with swelling  Please follow-up in 24 hours if not having any improvement with antibiotic or sooner if symptoms worsening, spreading to calf

## 2019-03-15 NOTE — ED Triage Notes (Signed)
C/O starting with left foot pain and swelling approx 2 days ago without fever.  Denies injury.  Redness and swelling noted.  Pt unable to bear weight on LLE.

## 2019-03-15 NOTE — ED Provider Notes (Signed)
[facility]    CSN: [id number] Arrival date & time: 03/15/19  1528      History   Chief Complaint Chief Complaint  Patient presents with  . Appointment    1600  . Foot Pain    HPI Jacob Black is a 58 y.o. male history of gout, presenting today for evaluation of left foot pain and swelling.  Patient states that over the past 2 days he has developed worsening pain swelling and redness to his left foot.  Initially related this to gout, but states that it feels different from his typical gout flare.  Typically has gout to his base of his right great toe.  States that pain is throbbing in nature.  Denies history of diabetes.  Denies previous DVT/PE.  Denies recent travel or immobilization.  Patient is a current everyday smoker.  Denies any fevers.  HPI  Past Medical History:  Diagnosis Date  . Gout     There are no active problems to display for this patient.   Past Surgical History:  Procedure Laterality Date  . LEG SURGERY         Home Medications    Prior to Admission medications   Medication Sig Start Date End Date Taking? Authorizing Provider  allopurinol (ZYLOPRIM) 100 MG tablet Take 100 mg by mouth daily.   Yes [provider]  colchicine 0.6 MG tablet Take 0.6 mg by mouth 3 (three) times daily as needed.   Yes [provider]  IBUPROFEN PO Take by mouth.   Yes [provider]  cephALEXin (KEFLEX) 500 MG capsule Take 1 capsule (500 mg total) by mouth 4 (four) times daily for 7 days. 03/15/19 03/22/19  ,  C, PA-C  HYDROcodone-acetaminophen (NORCO/VICODIN) 5-325 MG tablet Take 1 tablet by mouth every 6 (six) hours as needed for severe pain. 03/15/19   , Junius Creamer C, PA-C    Family History Family History  Problem Relation Age of Onset  . Diabetes Mother   . Heart disease Mother   . Diabetes Father   . Heart disease Father     Social History Social History   Tobacco Use  . Smoking status: Current Every  Day Smoker  . Smokeless tobacco: Never Used  Substance Use Topics  . Alcohol use: Yes    Comment: occasionally  . Drug use: Never     Allergies   Patient has no known allergies.   Review of Systems Review of Systems  Constitutional: Negative for fatigue and fever.  Eyes: Negative for redness, itching and visual disturbance.  Respiratory: Negative for shortness of breath.   Cardiovascular: Negative for chest pain and leg swelling.  Gastrointestinal: Negative for nausea and vomiting.  Musculoskeletal: Positive for gait problem, joint swelling and myalgias. Negative for arthralgias.  Skin: Positive for color change. Negative for rash and wound.  Neurological: Negative for dizziness, syncope, weakness, light-headedness and headaches.     Physical Exam Triage Vital Signs ED Triage Vitals  Enc Vitals Group     BP 03/15/19 1609 122/80     Pulse Rate 03/15/19 1609 81     Resp 03/15/19 1609 16     Temp 03/15/19 1609 98.5 F (36.9 C)     Temp Source 03/15/19 1609 Oral     SpO2 03/15/19 1609 97 %     Weight --      Height --      Head Circumference --      Peak Flow --  Pain Score 03/15/19 1610 10     Pain Loc --      Pain Edu? --      Excl. in GC? --    No data found.  Updated Vital Signs BP 122/80   Pulse 81   Temp 98.5 F (36.9 C) (Oral)   Resp 16   SpO2 97%   Visual Acuity Right Eye Distance:   Left Eye Distance:   Bilateral Distance:    Right Eye Near:   Left Eye Near:    Bilateral Near:     Physical Exam Vitals signs and nursing note reviewed.  Constitutional:      Appearance: He is well-developed.     Comments: No acute distress  HENT:     Head: Normocephalic and atraumatic.     Nose: Nose normal.  Eyes:     Conjunctiva/sclera: Conjunctivae normal.  Neck:     Musculoskeletal: Neck supple.  Cardiovascular:     Rate and Rhythm: Normal rate.  Pulmonary:     Effort: Pulmonary effort is normal. No respiratory distress.  Abdominal:      General: There is no distension.  Musculoskeletal: Normal range of motion.     Comments: Full active range of motion of ankle, able to wiggle toes, ambulating with antalgia, using knee scooter  Skin:    General: Skin is warm and dry.     Comments: Left foot: Dorsum of foot with swelling erythema and warmth to distal aspect spreading from along fifth metatarsal to second metatarsal, appears to have some mild streaking extending more proximally into ankle, no calf tenderness swelling or erythema, negative Homans Dorsalis pedis 2+  Neurological:     Mental Status: He is alert and oriented to person, place, and time.        UC Treatments / Results  Labs (all labs ordered are listed, but only abnormal results are displayed) Labs Reviewed - No data to display  EKG   Radiology No results found.  Procedures Procedures (including critical care time)  Medications Ordered in UC Medications - No data to display  Initial Impression / Assessment and Plan / UC Course  I have reviewed the triage vital signs and the nursing notes.  Pertinent labs & imaging results that were available during my care of the patient were reviewed by me and considered in my medical decision making (see chart for details).     Exam suggestive of cellulitis as cause of pain and swelling given mild streaking extending more proximally.  Do not suspect gout at this time.  Feel DVT less likely.  Will initiate treatment with antibiotics with Keflex, Tylenol and ibuprofen for mild to moderate pain.  Did provide a few tablets of hydrocodone for more severe pain.  Do not drive or work after taking.  Keep elevated, apply ice.  Follow-up in 24 to 48 hours if not having any improvement with antibiotic.Discussed strict return precautions. Patient verbalized understanding and is agreeable with plan.  Final Clinical Impressions(s) / UC Diagnoses   Final diagnoses:  Cellulitis of left lower extremity     Discharge  Instructions     Please begin taking Keflex 4 times a day for the next week Tylenol and ibuprofen for mild to moderate pain, pain during the day May use hydrocodone for severe pain, nighttime pain, will cause drowsiness, do not drive or work after taking Keep foot elevated, apply ice to further help with swelling  Please follow-up in 24 hours if not having any improvement  with antibiotic or sooner if symptoms worsening, spreading to calf   ED Prescriptions    Medication Sig Dispense Auth. Provider   cephALEXin (KEFLEX) 500 MG capsule Take 1 capsule (500 mg total) by mouth 4 (four) times daily for 7 days. 28 capsule ,  C, PA-C   HYDROcodone-acetaminophen (NORCO/VICODIN) 5-325 MG tablet Take 1 tablet by mouth every 6 (six) hours as needed for severe pain. 10 tablet , [place] C, PA-C     Controlled Substance Prescriptions [place] Controlled Substance Registry consulted? Not Applicable   Janith Lima, [place] 03/15/19 2240

## 2019-03-16 ENCOUNTER — Encounter (HOSPITAL_COMMUNITY): Payer: Self-pay | Admitting: Internal Medicine

## 2019-12-28 ENCOUNTER — Other Ambulatory Visit: Payer: Self-pay | Admitting: Family Medicine

## 2019-12-28 DIAGNOSIS — R1013 Epigastric pain: Secondary | ICD-10-CM

## 2019-12-31 ENCOUNTER — Other Ambulatory Visit: Payer: Commercial Managed Care - HMO

## 2020-01-01 ENCOUNTER — Ambulatory Visit
Admission: RE | Admit: 2020-01-01 | Discharge: 2020-01-01 | Disposition: A | Discharge status: Home / Self Care | Payer: Commercial Managed Care - HMO | Source: Ambulatory Visit | Attending: Family Medicine | Admitting: Family Medicine

## 2020-01-01 DIAGNOSIS — R1013 Epigastric pain: Secondary | ICD-10-CM

## 2020-01-05 ENCOUNTER — Other Ambulatory Visit: Payer: Self-pay | Admitting: Family Medicine

## 2020-01-05 DIAGNOSIS — K760 Fatty (change of) liver, not elsewhere classified: Secondary | ICD-10-CM

## 2020-05-06 ENCOUNTER — Encounter (HOSPITAL_COMMUNITY): Payer: Self-pay | Admitting: Emergency Medicine

## 2020-05-06 ENCOUNTER — Ambulatory Visit (HOSPITAL_COMMUNITY)
Admission: EM | Admit: 2020-05-06 | Discharge: 2020-05-06 | Disposition: A | Discharge status: Home / Self Care | Payer: 59 | Attending: Internal Medicine | Admitting: Internal Medicine

## 2020-05-06 ENCOUNTER — Other Ambulatory Visit: Payer: Self-pay

## 2020-05-06 DIAGNOSIS — H66011 Acute suppurative otitis media with spontaneous rupture of ear drum, right ear: Secondary | ICD-10-CM | POA: Diagnosis not present

## 2020-05-06 MED ORDER — AMOXICILLIN-POT CLAVULANATE 875-125 MG PO TABS: 1.0000 | ORAL_TABLET | Freq: Two times a day (BID) | ORAL | 0 refills | Status: DC

## 2020-05-06 MED ORDER — IBUPROFEN 600 MG PO TABS: 600.0000 mg | ORAL_TABLET | Freq: Three times a day (TID) | ORAL | 0 refills | Status: DC | PRN

## 2020-05-06 MED ORDER — GUAIFENESIN ER 600 MG PO TB12: 600.0000 mg | ORAL_TABLET | Freq: Two times a day (BID) | ORAL | 0 refills | Status: AC

## 2020-05-06 NOTE — ED Provider Notes (Signed)
[facility]    CSN: [id number] Arrival date & time: 05/06/20  1826      History   Chief Complaint No chief complaint on file.   HPI Jacob Black is a 59 y.o. male comes to the urgent care on account of right ear pain of 4 days duration.  Patient says symptoms started insidiously and has been severe.  Hearing out of the right ear is muffled.  Patient has noticed some discharge from the right ear.  No known relieving factors.  Is not associated with vertigo, nausea or vomiting.  Patient has a severe headache.  No fever or chills.Marland Kitchen   HPI  Past Medical History:  Diagnosis Date  . Gout     Patient Active Problem List   Diagnosis Date Noted  . Acute respiratory failure with hypoxia (HCC) 11/21/2016  . Acute bronchitis 11/21/2016  . Liver fibrosis 10/15/2015  . Chronic hepatitis C without hepatic coma (HCC) 08/10/2015  . Gout 08/10/2015    Past Surgical History:  Procedure Laterality Date  . FRACTURE SURGERY    . LEG SURGERY         Home Medications    Prior to Admission medications   Medication Sig Start Date End Date Taking? Authorizing Provider  acetaminophen (TYLENOL) 500 MG tablet Take 1,000 mg by mouth every 6 (six) hours as needed for moderate pain.    [provider]  allopurinol (ZYLOPRIM) 100 MG tablet Take 100 mg by mouth daily.    [provider]  ALPRAZolam Prudy Feeler) 0.5 MG tablet Take 0.5 mg by mouth See admin instructions. FOR PROCEDURES 10/25/16   [provider]  amoxicillin-clavulanate (AUGMENTIN) 875-125 MG tablet Take 1 tablet by mouth every 12 (twelve) hours. 05/06/20   Sueanne Maniaci, Britta Mccreedy, MD  guaiFENesin (MUCINEX) 600 MG 12 hr tablet Take 1 tablet (600 mg total) by mouth 2 (two) times daily for 7 days. 05/06/20 05/13/20  Merrilee Jansky, MD  ibuprofen (ADVIL) 600 MG tablet Take 1 tablet (600 mg total) by mouth every 8 (eight) hours as needed. 05/06/20   Harold Mattes, Britta Mccreedy, MD  albuterol (PROVENTIL HFA;VENTOLIN HFA) 108  (90 Base) MCG/ACT inhaler Inhale 2 puffs into the lungs every 6 (six) hours as needed for wheezing or shortness of breath. 11/21/16 05/06/20  Zannie Cove, MD  colchicine 0.6 MG tablet Take 0.6 mg by mouth 3 (three) times daily as needed.  05/06/20  [provider]  febuxostat (ULORIC) 40 MG tablet Take 40 mg by mouth daily.  05/06/20  [provider]    Family History Family History  Problem Relation Age of Onset  . Diabetes Mother   . Heart disease Mother   . Diabetes Father   . Heart disease Father   . Stroke Mother 31  . Heart attack Father     Social History Social History   Tobacco Use  . Smoking status: Current Every Day Smoker    Packs/day: 0.50    Types: Cigarettes    Start date: 09/03/1974  . Smokeless tobacco: Never Used  Vaping Use  . Vaping Use: Never used  Substance Use Topics  . Alcohol use: Yes    Comment: occasionally  . Drug use: Never     Allergies   Patient has no known allergies.   Review of Systems Review of Systems  Constitutional: Negative.   HENT: Positive for ear discharge, ear pain and hearing loss. Negative for facial swelling and sore throat.   Respiratory: Negative.  Gastrointestinal: Negative.   Neurological: Positive for headaches. Negative for dizziness and light-headedness.     Physical Exam Triage Vital Signs ED Triage Vitals  Enc Vitals Group     BP 05/06/20 1936 (!) 165/93     Pulse Rate 05/06/20 1936 84     Resp 05/06/20 1936 18     Temp 05/06/20 1936 97.7 F (36.5 C)     Temp Source 05/06/20 1936 Oral     SpO2 05/06/20 1936 99 %     Weight --      Height --      Head Circumference --      Peak Flow --      Pain Score 05/06/20 1937 9     Pain Loc --      Pain Edu? --      Excl. in GC? --    No data found.  Updated Vital Signs BP (!) 165/93 (BP Location: Right Arm)   Pulse 84   Temp 97.7 F (36.5 C) (Oral)   Resp 18   SpO2 99%   Visual Acuity Right Eye Distance:   Left Eye Distance:     Bilateral Distance:    Right Eye Near:   Left Eye Near:    Bilateral Near:     Physical Exam Vitals and nursing note reviewed.  Constitutional:      General: He is not in acute distress.    Appearance: He is not ill-appearing.  HENT:     Left Ear: Tympanic membrane, ear canal and external ear normal. There is no impacted cerumen.     Ears:     Comments: Tympanic membrane is ruptured.  Purulent material noted in the external ear canal.  No erythema of the external ear canal. Cardiovascular:     Rate and Rhythm: Normal rate and regular rhythm.  Pulmonary:     Effort: Pulmonary effort is normal.     Breath sounds: Normal breath sounds.  Neurological:     Mental Status: He is alert.      UC Treatments / Results  Labs (all labs ordered are listed, but only abnormal results are displayed) Labs Reviewed - No data to display  EKG   Radiology No results found.  Procedures Procedures (including critical care time)  Medications Ordered in UC Medications - No data to display  Initial Impression / Assessment and Plan / UC Course  I have reviewed the triage vital signs and the nursing notes.  Pertinent labs & imaging results that were available during my care of the patient were reviewed by me and considered in my medical decision making (see chart for details).     1.  Acute suppurative otitis media of the right ear with spontaneous rupture: Augmentin 875-125 mg twice daily for 7 days Ibuprofen every 6 hours as needed for pain Humibid twice daily Patient is advised against using hydrogen peroxide as eardrops Return precautions given If patient has worsening symptoms he is advised to return to the urgent care to be reevaluated. Final Clinical Impressions(s) / UC Diagnoses   Final diagnoses:  Non-recurrent acute suppurative otitis media of right ear with spontaneous rupture of tympanic membrane   Discharge Instructions   None    ED Prescriptions    Medication Sig  Dispense Auth. Provider   amoxicillin-clavulanate (AUGMENTIN) 875-125 MG tablet Take 1 tablet by mouth every 12 (twelve) hours. 14 tablet Marlane Hirschmann, Britta Mccreedy, MD   ibuprofen (ADVIL) 600 MG tablet Take 1 tablet (600 mg total)  by mouth every 8 (eight) hours as needed. 30 tablet Trinika Cortese, Britta Mccreedy, MD   guaiFENesin (MUCINEX) 600 MG 12 hr tablet Take 1 tablet (600 mg total) by mouth 2 (two) times daily for 7 days. 14 tablet Shakima Nisley, Britta Mccreedy, MD     PDMP not reviewed this encounter.   Merrilee Jansky, MD 05/06/20 2032

## 2020-05-06 NOTE — ED Triage Notes (Signed)
Pt presents to [facility] for assessment of 4 days of right ear pain with muffled hearing and a headache worsening today.

## 2021-02-17 ENCOUNTER — Ambulatory Visit
Admission: EM | Admit: 2021-02-17 | Discharge: 2021-02-17 | Disposition: A | Discharge status: Home / Self Care | Payer: 59 | Attending: Student | Admitting: Student

## 2021-02-17 ENCOUNTER — Other Ambulatory Visit: Payer: Self-pay

## 2021-02-17 ENCOUNTER — Ambulatory Visit (INDEPENDENT_AMBULATORY_CARE_PROVIDER_SITE_OTHER): Payer: 59

## 2021-02-17 DIAGNOSIS — S52511A Displaced fracture of right radial styloid process, initial encounter for closed fracture: Secondary | ICD-10-CM

## 2021-02-17 DIAGNOSIS — W19XXXA Unspecified fall, initial encounter: Secondary | ICD-10-CM

## 2021-02-17 DIAGNOSIS — M25531 Pain in right wrist: Secondary | ICD-10-CM

## 2021-02-17 MED ORDER — TRAMADOL HCL 50 MG PO TABS: 50.0000 mg | ORAL_TABLET | Freq: Four times a day (QID) | ORAL | 0 refills | Status: DC | PRN

## 2021-02-17 NOTE — ED Triage Notes (Signed)
Pt states slipped and fell getting out of bed at 3am, c/o rt wrist pain.

## 2021-02-17 NOTE — Discharge Instructions (Addendum)
-  Tramadol for pain, up to every 6 hours. This medication can cause drowsiness, so avoid driving or operating machinery after taking. -Avoid tylenol. Ibuprofen for additional relief, taken with food.  -Rest, heat/ice. -Follow-up with orthopedist at the earliest convenience for further management.  Information below, or you can call when you have in mind to schedule this. -Seek additional medical attention if you develop new symptoms despite treatment, like numbness in the hand, weakness in the hand.

## 2021-02-17 NOTE — ED Provider Notes (Signed)
[facility]    CSN: [id number] Arrival date & time: 02/17/21  5035      History   Chief Complaint Chief Complaint  Patient presents with   Wrist Pain    HPI Jacob Black is a 60 y.o. male presenting with right wrist pain following slipping while getting out of bed at 3 AM (6 hours ago).  Medical history gout, hepatitis C, liver fibrosis, respiratory failure.  States he is having pain of the right wrist since then with some tingling in his second through fourth fingers.  Denies dizziness before or after the fall.  Denies head trauma, loss of consciousness, headaches, vision changes.  Denies weakness in arms or legs.  Denies pain or injury elsewhere.  Denies change in bowel or bladder function, denies hematuria.  HPI  Past Medical History:  Diagnosis Date   Gout     Patient Active Problem List   Diagnosis Date Noted   Acute respiratory failure with hypoxia (HCC) 11/21/2016   Acute bronchitis 11/21/2016   Liver fibrosis 10/15/2015   Chronic hepatitis C without hepatic coma (HCC) 08/10/2015   Gout 08/10/2015    Past Surgical History:  Procedure Laterality Date   FRACTURE SURGERY     LEG SURGERY         Home Medications    Prior to Admission medications   Medication Sig Start Date End Date Taking? Authorizing Provider  traMADol (ULTRAM) 50 MG tablet Take 1 tablet (50 mg total) by mouth every 6 (six) hours as needed. 02/17/21  Yes Rhys Martini, PA-C  acetaminophen (TYLENOL) 500 MG tablet Take 1,000 mg by mouth every 6 (six) hours as needed for moderate pain.    [provider]  allopurinol (ZYLOPRIM) 100 MG tablet Take 100 mg by mouth daily.    [provider]  ALPRAZolam Prudy Feeler) 0.5 MG tablet Take 0.5 mg by mouth See admin instructions. FOR PROCEDURES 10/25/16   [provider]  ibuprofen (ADVIL) 600 MG tablet Take 1 tablet (600 mg total) by mouth every 8 (eight) hours as needed. 05/06/20   Lamptey, Britta Mccreedy, MD  albuterol  (PROVENTIL HFA;VENTOLIN HFA) 108 (90 Base) MCG/ACT inhaler Inhale 2 puffs into the lungs every 6 (six) hours as needed for wheezing or shortness of breath. 11/21/16 05/06/20  Zannie Cove, MD  colchicine 0.6 MG tablet Take 0.6 mg by mouth 3 (three) times daily as needed.  05/06/20  [provider]  febuxostat (ULORIC) 40 MG tablet Take 40 mg by mouth daily.  05/06/20  [provider]    Family History Family History  Problem Relation Age of Onset   Diabetes Mother    Heart disease Mother    Diabetes Father    Heart disease Father    Stroke Mother 40   Heart attack Father     Social History Social History   Tobacco Use   Smoking status: Every Day    Packs/day: 0.50    Pack years: 0.00    Types: Cigarettes    Start date: 09/03/1974   Smokeless tobacco: Never  Vaping Use   Vaping Use: Never used  Substance Use Topics   Alcohol use: Yes    Comment: occasionally   Drug use: Never     Allergies   Patient has no known allergies.   Review of Systems Review of Systems  Musculoskeletal:        R wrist pain  All other systems reviewed and are negative.   Physical Exam  Triage Vital Signs ED Triage Vitals  Enc Vitals Group     BP 02/17/21 0923 (!) 167/108     Pulse Rate 02/17/21 0923 73     Resp 02/17/21 0923 18     Temp 02/17/21 0923 98 F (36.7 C)     Temp Source 02/17/21 0923 Oral     SpO2 02/17/21 0923 96 %     Weight --      Height --      Head Circumference --      Peak Flow --      Pain Score 02/17/21 0924 7     Pain Loc --      Pain Edu? --      Excl. in GC? --    No data found.  Updated Vital Signs BP (!) 167/108 (BP Location: Left Arm)   Pulse 73   Temp 98 F (36.7 C) (Oral)   Resp 18   SpO2 96%   Visual Acuity Right Eye Distance:   Left Eye Distance:   Bilateral Distance:    Right Eye Near:   Left Eye Near:    Bilateral Near:     Physical Exam Vitals reviewed.  Constitutional:      General: He is not in acute  distress.    Appearance: Normal appearance. He is not ill-appearing or diaphoretic.  HENT:     Head: Normocephalic and atraumatic.  Cardiovascular:     Rate and Rhythm: Normal rate and regular rhythm.     Heart sounds: Normal heart sounds.  Pulmonary:     Effort: Pulmonary effort is normal.     Breath sounds: Normal breath sounds.  Musculoskeletal:     Comments: R distal radius with obvious deformity- effusion and tenderness. No ecchymosis or abrasion. ROM wrist limited due to pain. Grip strength 2/5. Cap refill <2 seconds. Radial pulse 2+. Sensation intact hand and fingers. No snuffbox tenderness.   Skin:    General: Skin is warm.  Neurological:     General: No focal deficit present.     Mental Status: He is alert and oriented to person, place, and time.  Psychiatric:        Mood and Affect: Mood normal.        Behavior: Behavior normal.        Thought Content: Thought content normal.        Judgment: Judgment normal.     UC Treatments / Results  Labs (all labs ordered are listed, but only abnormal results are displayed) Labs Reviewed - No data to display  EKG   Radiology DG Wrist Complete Right  Result Date: 02/17/2021 CLINICAL DATA:  60 year old male status post fall out of bed. Pain and swelling. EXAM: RIGHT WRIST - COMPLETE 3+ VIEW COMPARISON:  None. FINDINGS: Minimally displaced but intra-articular fracture of the right radial styloid is noted (image 3). Distal ulna is intact. Maintained carpal bone alignment and joint spaces. No carpal or proximal metacarpal fracture identified. Soft tissue swelling at the wrist. IMPRESSION: Acute minimally displaced but intra-articular fracture of the right radial styloid. Electronically Signed   By: Odessa Fleming M.D.   On: 02/17/2021 10:21    Procedures Procedures (including critical care time)  Medications Ordered in UC Medications - No data to display  Initial Impression / Assessment and Plan / UC Course  I have reviewed the triage  vital signs and the nursing notes.  Pertinent labs & imaging results that were available during my care of the patient  were reviewed by me and considered in my medical decision making (see chart for details).     This patient is a 60 year old male presenting with R radius fracture following fall that occurred 6 hours ago. Neurovascularly intact.  Xray R wrist - Acute minimally displaced but intra-articular fracture of the right radial styloid.  Short arm splint applied. Tramadol sent. Avoid tylenol given history hepatitis.   ED return precautions discussed. Patient verbalizes understanding and agreement.    Final Clinical Impressions(s) / UC Diagnoses   Final diagnoses:  Closed displaced fracture of styloid process of right radius, initial encounter     Discharge Instructions      -Tramadol for pain, up to every 6 hours. This medication can cause drowsiness, so avoid driving or operating machinery after taking. -Avoid tylenol. Ibuprofen for additional relief, taken with food.  -Rest, heat/ice. -Follow-up with orthopedist at the earliest convenience for further management.  Information below, or you can call when you have in mind to schedule this. -Seek additional medical attention if you develop new symptoms despite treatment, like numbness in the hand, weakness in the hand.     ED Prescriptions     Medication Sig Dispense Auth. Provider   traMADol (ULTRAM) 50 MG tablet Take 1 tablet (50 mg total) by mouth every 6 (six) hours as needed. 15 tablet Rhys Martini, PA-C      I have reviewed the PDMP during this encounter.   Rhys Martini, PA-C 02/17/21 1041

## 2021-02-25 ENCOUNTER — Other Ambulatory Visit: Payer: Self-pay

## 2021-02-25 ENCOUNTER — Emergency Department (HOSPITAL_COMMUNITY)
Admission: EM | Admit: 2021-02-25 | Discharge: 2021-02-25 | Disposition: A | Discharge status: Home / Self Care | Payer: 59 | Attending: Emergency Medicine | Admitting: Emergency Medicine

## 2021-02-25 DIAGNOSIS — F17210 Nicotine dependence, cigarettes, uncomplicated: Secondary | ICD-10-CM | POA: Insufficient documentation

## 2021-02-25 DIAGNOSIS — M25521 Pain in right elbow: Secondary | ICD-10-CM | POA: Insufficient documentation

## 2021-02-25 MED ORDER — HYDROMORPHONE HCL 1 MG/ML IJ SOLN
1.0000 mg | Freq: Once | INTRAMUSCULAR | Status: AC
  Administered 2021-02-25: 06:00:00 1 mg via INTRAMUSCULAR
  Filled 2021-02-25: qty 1

## 2021-02-25 NOTE — Discharge Instructions (Signed)
Your orthopedist office has urgent care hours available today from 10am-3pm.  I would go there to try and be seen, hopefully they have your MRI results by now. Return here for new concerns.

## 2021-02-25 NOTE — ED Triage Notes (Signed)
Pt came in with c/o R elbow pain and swelling. Pt states it has been ongoing for a week. He had an MRI Thursday, but he has not gotten results back. Pt states that he cannot tolerate the pain. Pt took oxycodone two hours prior to arrival

## 2021-02-25 NOTE — ED Provider Notes (Signed)
[facility]-EMERGENCY DEPT Provider Note   CSN: [id number] Arrival date & time: 02/25/21  0454     History Chief Complaint  Patient presents with   Joint Swelling    Jacob Black is a 60 y.o. male.  The history is provided by the patient and medical records.   60 year old male with history of gout, hepatitis C, presenting to the ED with right elbow pain.  This began several weeks ago, worse after he fell out of bed last week and struck it on the railing of the bed.  He did sustain wrist fracture during this fall.  He has since had follow-up with orthopedics ([facility]) for his right wrist fracture as well as right elbow pain and had an MRI of his elbow done on Thursday 02/23/21.  He does not have the results of this yet.  He presents to the ED tonight due to uncontrolled pain.  He has not had any progression of swelling, no new fever, new redness, or other acute concerns.  He was initially getting some relief with his oxycodone, but now it is making him constipated and is no longer relieving his pain.  He is also chronically taking allopurinol as he does have history of gout.  He is right hand dominant.  Has follow-up ortho appt 02/28/21.  Past Medical History:  Diagnosis Date   Gout     Patient Active Problem List   Diagnosis Date Noted   Acute respiratory failure with hypoxia (HCC) 11/21/2016   Acute bronchitis 11/21/2016   Liver fibrosis 10/15/2015   Chronic hepatitis C without hepatic coma (HCC) 08/10/2015   Gout 08/10/2015    Past Surgical History:  Procedure Laterality Date   FRACTURE SURGERY     LEG SURGERY         Family History  Problem Relation Age of Onset   Diabetes Mother    Heart disease Mother    Diabetes Father    Heart disease Father    Stroke Mother 49   Heart attack Father     Social History   Tobacco Use   Smoking status: Every Day    Packs/day: 0.50    Pack years: 0.00    Types: Cigarettes    Start date: 09/03/1974    Smokeless tobacco: Never  Vaping Use   Vaping Use: Never used  Substance Use Topics   Alcohol use: Yes    Comment: occasionally   Drug use: Never    Home Medications Prior to Admission medications   Medication Sig Start Date End Date Taking? Authorizing Provider  acetaminophen (TYLENOL) 500 MG tablet Take 1,000 mg by mouth every 6 (six) hours as needed for moderate pain.    [provider]  allopurinol (ZYLOPRIM) 100 MG tablet Take 100 mg by mouth daily.    [provider]  ALPRAZolam Prudy Feeler) 0.5 MG tablet Take 0.5 mg by mouth See admin instructions. FOR PROCEDURES 10/25/16   [provider]  ibuprofen (ADVIL) 600 MG tablet Take 1 tablet (600 mg total) by mouth every 8 (eight) hours as needed. 05/06/20   Lamptey, Britta Mccreedy, MD  traMADol (ULTRAM) 50 MG tablet Take 1 tablet (50 mg total) by mouth every 6 (six) hours as needed. 02/17/21   Rhys Martini, PA-C  albuterol (PROVENTIL HFA;VENTOLIN HFA) 108 (90 Base) MCG/ACT inhaler Inhale 2 puffs into the lungs every 6 (six) hours as needed for wheezing or shortness of breath. 11/21/16 05/06/20  Zannie Cove, MD  colchicine 0.6  MG tablet Take 0.6 mg by mouth 3 (three) times daily as needed.  05/06/20  [provider]  febuxostat (ULORIC) 40 MG tablet Take 40 mg by mouth daily.  05/06/20  [provider]    Allergies    Patient has no known allergies.  Review of Systems   Review of Systems  Musculoskeletal:  Positive for arthralgias.  All other systems reviewed and are negative.  Physical Exam Updated Vital Signs BP (!) 145/95 (BP Location: Left Arm)   Pulse 70   Temp 99 F (37.2 C) (Oral)   Resp 18   Ht 5\' 8"  (1.727 m)   Wt 93 kg   SpO2 98%   BMI 31.17 kg/m   Physical Exam Vitals and nursing note reviewed.  Constitutional:      Appearance: He is well-developed.  HENT:     Head: Normocephalic and atraumatic.  Eyes:     Conjunctiva/sclera: Conjunctivae normal.     Pupils: Pupils are  equal, round, and reactive to light.  Cardiovascular:     Rate and Rhythm: Normal rate and regular rhythm.     Heart sounds: Normal heart sounds.  Pulmonary:     Effort: Pulmonary effort is normal.     Breath sounds: Normal breath sounds.  Abdominal:     General: Bowel sounds are normal.     Palpations: Abdomen is soft.  Musculoskeletal:        General: Normal range of motion.     Cervical back: Normal range of motion.     Comments: Right elbow with swelling noted along the bursa, there is no overlying erythema, induration, or tissue crepitus, he does have increased pain with ROM but is able to flex/extend; radial pulse intact, moving fingers as normal  Skin:    General: Skin is warm and dry.  Neurological:     Mental Status: He is alert and oriented to person, place, and time.    ED Results / Procedures / Treatments   Labs (all labs ordered are listed, but only abnormal results are displayed) Labs Reviewed - No data to display  EKG None  Radiology No results found.  Procedures Procedures   Medications Ordered in ED Medications  HYDROmorphone (DILAUDID) injection 1 mg (has no administration in time range)    ED Course  I have reviewed the triage vital signs and the nursing notes.  Pertinent labs & imaging results that were available during my care of the patient were reviewed by me and considered in my medical decision making (see chart for details).    MDM Rules/Calculators/A&P  60 year old male here requesting pain control for right elbow pain.  Has been having some ongoing issues of the past few weeks, has already seen orthopedics for this and had MRI done 2 days ago.  Does not yet have results.  Does not seem like there has been any acute change in his symptoms, pain is just no longer controlled.  He is afebrile and nontoxic in appearance here.  There is some swelling noted along the right elbow bursa but there is no significant overlying erythema, induration, or  tissue crepitus of the joint.  Compartments are soft and compressible.  His arm is neurovascular intact.  He does have noted pain with range of motion, but is able to flex and extend when prompted.    I am unable to see his MRI images/report nor notes from orthopedic office.  Clinically, does not appear to have septic joint at this time.  As he just had imaging, do not feel this needs to be repeated today.  Will give pain control for now.  His orthopedic office does have urgent care hours on the weekend, I have encouraged him to follow-up there later this morning once they open as he does not feel he can wait until appt on Tuesday 02/28/21.   He may return here for any new/acute changes.  Final Clinical Impression(s) / ED Diagnoses Final diagnoses:  Right elbow pain    Rx / DC Orders ED Discharge Orders     None        Garlon Hatchet, PA-C 02/25/21 0547    Marily Memos, MD 02/25/21 [phone]

## 2022-04-20 ENCOUNTER — Emergency Department (HOSPITAL_COMMUNITY): Payer: 59

## 2022-04-20 ENCOUNTER — Other Ambulatory Visit: Payer: Self-pay

## 2022-04-20 ENCOUNTER — Emergency Department (HOSPITAL_COMMUNITY)
Admission: EM | Admit: 2022-04-20 | Discharge: 2022-04-20 | Disposition: A | Discharge status: Home / Self Care | Payer: 59 | Attending: Emergency Medicine | Admitting: Emergency Medicine

## 2022-04-20 DIAGNOSIS — Z20822 Contact with and (suspected) exposure to covid-19: Secondary | ICD-10-CM | POA: Insufficient documentation

## 2022-04-20 DIAGNOSIS — R0602 Shortness of breath: Secondary | ICD-10-CM

## 2022-04-20 DIAGNOSIS — F17200 Nicotine dependence, unspecified, uncomplicated: Secondary | ICD-10-CM | POA: Insufficient documentation

## 2022-04-20 DIAGNOSIS — J3489 Other specified disorders of nose and nasal sinuses: Secondary | ICD-10-CM | POA: Diagnosis not present

## 2022-04-20 LAB — CBC WITH DIFFERENTIAL/PLATELET
Abs Immature Granulocytes: 0.05 10*3/uL (ref 0.00–0.07)
Basophils Absolute: 0.1 10*3/uL (ref 0.0–0.1)
Basophils Relative: 2 %
Eosinophils Absolute: 0.4 10*3/uL (ref 0.0–0.5)
Eosinophils Relative: 5 %
HCT: 40.4 % (ref 39.0–52.0)
Hemoglobin: 13.5 g/dL (ref 13.0–17.0)
Immature Granulocytes: 1 %
Lymphocytes Relative: 29 %
Lymphs Abs: 2.1 10*3/uL (ref 0.7–4.0)
MCH: 32.4 pg (ref 26.0–34.0)
MCHC: 33.4 g/dL (ref 30.0–36.0)
MCV: 96.9 fL (ref 80.0–100.0)
Monocytes Absolute: 0.8 10*3/uL (ref 0.1–1.0)
Monocytes Relative: 11 %
Neutro Abs: 3.7 10*3/uL (ref 1.7–7.7)
Neutrophils Relative %: 52 %
Platelets: 237 10*3/uL (ref 150–400)
RBC: 4.17 MIL/uL — ABNORMAL LOW (ref 4.22–5.81)
RDW: 12.9 % (ref 11.5–15.5)
WBC: 7.1 10*3/uL (ref 4.0–10.5)
nRBC: 0 % (ref 0.0–0.2)

## 2022-04-20 LAB — BASIC METABOLIC PANEL
Anion gap: 7 (ref 5–15)
BUN: 24 mg/dL — ABNORMAL HIGH (ref 6–20)
CO2: 23 mmol/L (ref 22–32)
Calcium: 9.4 mg/dL (ref 8.9–10.3)
Chloride: 110 mmol/L (ref 98–111)
Creatinine, Ser: 1.18 mg/dL (ref 0.61–1.24)
GFR, Estimated: >60 mL/min (ref >60)
Glucose, Bld: 124 mg/dL — ABNORMAL HIGH (ref 70–99)
Potassium: 3.9 mmol/L (ref 3.5–5.1)
Sodium: 140 mmol/L (ref 135–145)

## 2022-04-20 LAB — D-DIMER, QUANTITATIVE: D-Dimer, Quant: 0.3 ug/mL-FEU (ref 0.00–0.50)

## 2022-04-20 LAB — TROPONIN I (HIGH SENSITIVITY)
Troponin I (High Sensitivity): 4 ng/L (ref <18)
Troponin I (High Sensitivity): 4 ng/L (ref <18)

## 2022-04-20 LAB — SARS CORONAVIRUS 2 BY RT PCR: SARS Coronavirus 2 by RT PCR: NEGATIVE

## 2022-04-20 LAB — BRAIN NATRIURETIC PEPTIDE: B Natriuretic Peptide: 13.8 pg/mL (ref 0.0–100.0)

## 2022-04-20 MED ORDER — ALBUTEROL SULFATE HFA 108 (90 BASE) MCG/ACT IN AERS
2.0000 | INHALATION_SPRAY | Freq: Once | RESPIRATORY_TRACT | Status: AC
  Administered 2022-04-20: 2 via RESPIRATORY_TRACT
  Filled 2022-04-20: qty 6.7

## 2022-04-20 MED ORDER — ALUM & MAG HYDROXIDE-SIMETH 200-200-20 MG/5ML PO SUSP
30.0000 mL | Freq: Once | ORAL | Status: AC
  Administered 2022-04-20: 30 mL via ORAL
  Filled 2022-04-20: qty 30

## 2022-04-20 MED ORDER — FAMOTIDINE 20 MG PO TABS
20.0000 mg | ORAL_TABLET | Freq: Once | ORAL | Status: AC
  Administered 2022-04-20: 20 mg via ORAL
  Filled 2022-04-20: qty 1

## 2022-04-20 NOTE — ED Provider Notes (Signed)
[facility]-EMERGENCY DEPT Provider Note   CSN: [id number] Arrival date & time: 04/20/22  4166     History  Chief Complaint  Patient presents with   Shortness of Breath    Jacob Black is a 61 y.o. male history of gout and smoker.  Presents to the ER for shortness of breath onset around 3 AM this morning, no clear inciting event patient woke up with difficulty breathing.  Patient reports difficulty taking in a full breath, he denies similar symptoms in the past.  He has not tried any medications prior to arrival.  He describes symptoms as moderate intensity and constant.  He denies worsening of symptoms with exertion.  He denies recent illness, fever, chills, cough, hemoptysis, chest pain, abdominal pain, nausea, vomiting, diarrhea, extremity swelling/color change, exogenous hormone use, recent surgery/immobilization, history of cancer, extremity swelling or any additional concerns.  Patient reports history of smoking 0.5 packs/day x 40 years. HPI     Home Medications Prior to Admission medications   Medication Sig Start Date End Date Taking? Authorizing Provider  acetaminophen (TYLENOL) 500 MG tablet Take 1,000 mg by mouth every 6 (six) hours as needed for moderate pain.    [provider]  allopurinol (ZYLOPRIM) 100 MG tablet Take 100 mg by mouth daily.    [provider]  ALPRAZolam Prudy Feeler) 0.5 MG tablet Take 0.5 mg by mouth See admin instructions. FOR PROCEDURES 10/25/16   [provider]  ibuprofen (ADVIL) 600 MG tablet Take 1 tablet (600 mg total) by mouth every 8 (eight) hours as needed. 05/06/20   Lamptey, Britta Mccreedy, MD  traMADol (ULTRAM) 50 MG tablet Take 1 tablet (50 mg total) by mouth every 6 (six) hours as needed. 02/17/21   Rhys Martini, PA-C  albuterol (PROVENTIL HFA;VENTOLIN HFA) 108 (90 Base) MCG/ACT inhaler Inhale 2 puffs into the lungs every 6 (six) hours as needed for wheezing or shortness of breath. 11/21/16 05/06/20   Zannie Cove, MD  colchicine 0.6 MG tablet Take 0.6 mg by mouth 3 (three) times daily as needed.  05/06/20  [provider]  febuxostat (ULORIC) 40 MG tablet Take 40 mg by mouth daily.  05/06/20  [provider]      Allergies    Patient has no known allergies.    Review of Systems   Review of Systems Ten systems are reviewed and are negative for acute change except as noted in the HPI  Physical Exam Updated Vital Signs BP (!) 141/68   Pulse 61   Temp (!) 97.4 F (36.3 C) (Oral)   Resp 16   Ht 5\' 8"  (1.727 m)   Wt 90.7 kg   SpO2 100%   BMI 30.41 kg/m  Physical Exam Constitutional:      General: He is not in acute distress.    Appearance: Normal appearance. He is well-developed. He is not ill-appearing or diaphoretic.  HENT:     Head: Normocephalic and atraumatic.     Nose: Congestion and rhinorrhea present. Rhinorrhea is clear.     Mouth/Throat:     Mouth: Mucous membranes are moist.     Pharynx: Oropharynx is clear.  Eyes:     General: Vision grossly intact. Gaze aligned appropriately.     Pupils: Pupils are equal, round, and reactive to light.  Neck:     Trachea: Trachea and phonation normal.  Cardiovascular:     Rate and Rhythm: Normal rate and regular rhythm.     Pulses: Normal  pulses.  Pulmonary:     Effort: Pulmonary effort is normal. No tachypnea or respiratory distress.     Comments: Coarse breath sounds bilaterally Abdominal:     General: There is no distension.     Palpations: Abdomen is soft.     Tenderness: There is no abdominal tenderness. There is no guarding or rebound.  Musculoskeletal:        General: Normal range of motion.     Cervical back: Normal range of motion.     Right lower leg: No edema.     Left lower leg: No edema.  Skin:    General: Skin is warm and dry.  Neurological:     Mental Status: He is alert.     GCS: GCS eye subscore is 4. GCS verbal subscore is 5. GCS motor subscore is 6.     Comments: Speech is clear  and goal oriented, follows commands Major Cranial nerves without deficit, no facial droop Moves extremities without ataxia, coordination intact  Psychiatric:        Behavior: Behavior normal.     ED Results / Procedures / Treatments   Labs (all labs ordered are listed, but only abnormal results are displayed) Labs Reviewed  CBC WITH DIFFERENTIAL/PLATELET - Abnormal; Notable for the following components:      Result Value   RBC 4.17 (*)    All other components within normal limits  BASIC METABOLIC PANEL - Abnormal; Notable for the following components:   Glucose, Bld 124 (*)    BUN 24 (*)    All other components within normal limits  SARS CORONAVIRUS 2 BY RT PCR  BRAIN NATRIURETIC PEPTIDE  D-DIMER, QUANTITATIVE  TROPONIN I (HIGH SENSITIVITY)  TROPONIN I (HIGH SENSITIVITY)    EKG EKG Interpretation  Date/Time:  Friday April 20 2022 05:44:53 EDT Ventricular Rate:  66 PR Interval:  222 QRS Duration: 86 QT Interval:  383 QTC Calculation: 402 R Axis:   68 Text Interpretation: Sinus rhythm Prolonged PR interval Confirmed by Palumbo, April ([id number]) on 04/20/2022 5:59:36 AM  Radiology DG Chest 2 View  Result Date: 04/20/2022 CLINICAL DATA:  61 year old male who woke with shortness of breath. EXAM: CHEST - 2 VIEW COMPARISON:  Chest CTA 11/20/2016 and earlier. FINDINGS: PA and lateral views at 0600 hours. Low normal lung volumes similar to 2018. Normal cardiac size and mediastinal contours. Visualized tracheal air column is within normal limits. Both lungs appear stable and clear. No pneumothorax or pleural effusion. No acute osseous abnormality identified. Negative visible bowel gas. IMPRESSION: Negative.  No acute cardiopulmonary abnormality. Electronically Signed   By: Odessa Fleming M.D.   On: 04/20/2022 06:25    Procedures Procedures    Medications Ordered in ED Medications  albuterol (VENTOLIN HFA) 108 (90 Base) MCG/ACT inhaler 2 puff (has no administration in time range)   famotidine (PEPCID) tablet 20 mg (20 mg Oral Given 04/20/22 0806)  alum & mag hydroxide-simeth (MAALOX/MYLANTA) 200-200-20 MG/5ML suspension 30 mL (30 mLs Oral Given 04/20/22 0240)    ED Course/ Medical Decision Making/ A&P Clinical Course as of 04/20/22 1119  Fri Apr 20, 2022  1018 Albuterol inhaler [BM]  1115 EKG 12-Lead I have personally reviewed and interpreted patient's twelve-lead EKG.  I do not appreciate any obvious acute ischemic changes.  Normal sinus rhythm. [BM]  1116 DG Chest 2 View I personally reviewed and interpreted patient's two-view chest x-ray.  I do not appreciate any obvious PTX, consolidations or other acute abnormalities. [BM]  1116 CBC  with Differential(!) CBC shows hemoglobin of 13.5, doubt symptomatic anemia.  No leukocytosis to suggest infectious process.  No thrombocytopenia. [BM]  1116 Basic metabolic panel(!) BMP shows no emergent electrolyte derangement, AKI or gap. [BM]  1117 SARS Coronavirus 2 by RT PCR (hospital order, performed in [facility] hospital lab) *cepheid single result test* Anterior Nasal Swab COVID test negative. [BM]  1117 D-dimer, quantitative Except for age patient meets Wells/PERC criteria, D-dimer was ordered and is negative.  Low suspicion for pulmonary embolism. [BM]  1117 Brain natriuretic peptide BMP within normal limits, low suspicion for CHF exacerbation. [BM]  1117 Troponin I (High Sensitivity) Initial and delta high-sensitivity troponin within normal limits.  Low suspicion for ACS. [BM]    Clinical Course User Index [BM] Elizabeth Palau                           Medical Decision Making 61 year old male presented for shortness of breath onset 3 AM, no clear inciting event.  On examination patient is in no acute distress vital signs stable on room air.  He has coarse breath sounds bilaterally and a 40-year history of smoking.  He has some mild rhinorrhea and nasal congestion present.  He reports difficulty catching his  breath without associated chest pain.  Differential includes but not limited to viral infection, COPD, ACS, PE, PTX, PNA, CHF.  Plan to obtain CBC, BMP, chest x-ray, troponin, EKG, BNP, D-dimer.  Patient placed on cardiac monitor.  Amount and/or Complexity of Data Reviewed Labs: ordered. Radiology: ordered.  Risk OTC drugs. Prescription drug management. Risk Details: Patient's work-up today overall reassuring.  On examination does appear to be experiencing a mild URI which could be contributing to his symptoms.  I discussed home symptomatic therapies with patient, he was given an albuterol inhaler to to help with his symptoms.  No indication for antibiotics at this time.  I discussed smoking cessation with the patient and he stated understanding.  Low suspicion for ACS, PE, dissection, PNA, COPD exacerbation, CHF exacerbation, myocarditis or other emergent causes of patient's symptoms at this time.  Patient appears stable for discharge with close PCP follow-up.  Vital signs are reassuring on recheck.  Patient was seen and evaluated by attending physician Dr. Denton Lank who agrees with discharge with albuterol inhaler and PCP follow-up.    At this time there does not appear to be any evidence of an acute emergency medical condition and the patient appears stable for discharge with appropriate outpatient follow up. Diagnosis was discussed with patient who verbalizes understanding of care plan and is agreeable to discharge. I have discussed return precautions with patient who verbalizes understanding. Patient encouraged to follow-up with their PCP. All questions answered.   Note: Portions of this report may have been transcribed using voice recognition software. Every effort was made to ensure accuracy; however, inadvertent computerized transcription errors may still be present.         Final Clinical Impression(s) / ED Diagnoses Final diagnoses:  SOB (shortness of breath)    Rx / DC  Orders ED Discharge Orders     None         Elizabeth Palau 04/20/22 1120    Cathren Laine, MD 04/20/22 [phone]

## 2022-04-20 NOTE — ED Triage Notes (Signed)
Patient coming to ED for evaluation of SHOB.  Reports "I don't feel like I can get a good breath in."  No reports of recent travel.  No hx of COPD, asthma, or CHF.  States symptoms started 2 hours PTA.  No recent cough or congestion

## 2022-04-20 NOTE — Discharge Instructions (Signed)
At this time there does not appear to be the presence of an emergent medical condition, however there is always the potential for conditions to change. Please read and follow the below instructions.  Please return to the Emergency Department immediately for any new or worsening symptoms. Please be sure to follow up with your Primary Care Provider within one week regarding your visit today; please call their office to schedule an appointment even if you are feeling better for a follow-up visit. You may use the albuterol inhaler as needed to help with shortness of breath.  You may also try allergy medication or other over-the-counter medicines to help with your stuffy nose. You may take the medication Pepcid as prescribed to help with possible acid reflux related symptoms. Please try to stop smoking as this is bad for your health.  Your blood pressure was slightly elevated today.  Please have it rechecked by your primary care provider within the next week to ensure its improvement.  Go to the nearest Emergency Department immediately if: You have fever or chills Your shortness of breath gets worse. You have trouble breathing when you are resting. You feel light-headed or you faint. You have a cough that is not helped by medicines. You cough up blood. You have pain with breathing. You have pain in your chest, arms, shoulders, or belly (abdomen). You have any new/concerning or worsening of symptoms.  Please read the additional information packets attached to your discharge summary.  Go to the nearest Emergency Department immediately if: You have fever or chills  Do not take your medicine if  develop an itchy rash, swelling in your mouth or lips, or difficulty breathing; call 911 and seek immediate emergency medical attention if this occurs.  You may review your lab tests and imaging results in their entirety on your MyChart account.  Please discuss all results of fully with your primary care  provider and other specialist at your follow-up visit.  Note: Portions of this text may have been transcribed using voice recognition software. Every effort was made to ensure accuracy; however, inadvertent computerized transcription errors may still be present.

## 2022-12-22 IMAGING — DX DG WRIST COMPLETE 3+V*R*
4 series · 4 of 4 positions shown · non-contrast
Comparison: None.

CLINICAL DATA: 59-year-old male status post fall out of bed. Pain
and swelling.

EXAM:
RIGHT WRIST - COMPLETE 3+ VIEW

[wrist pa]
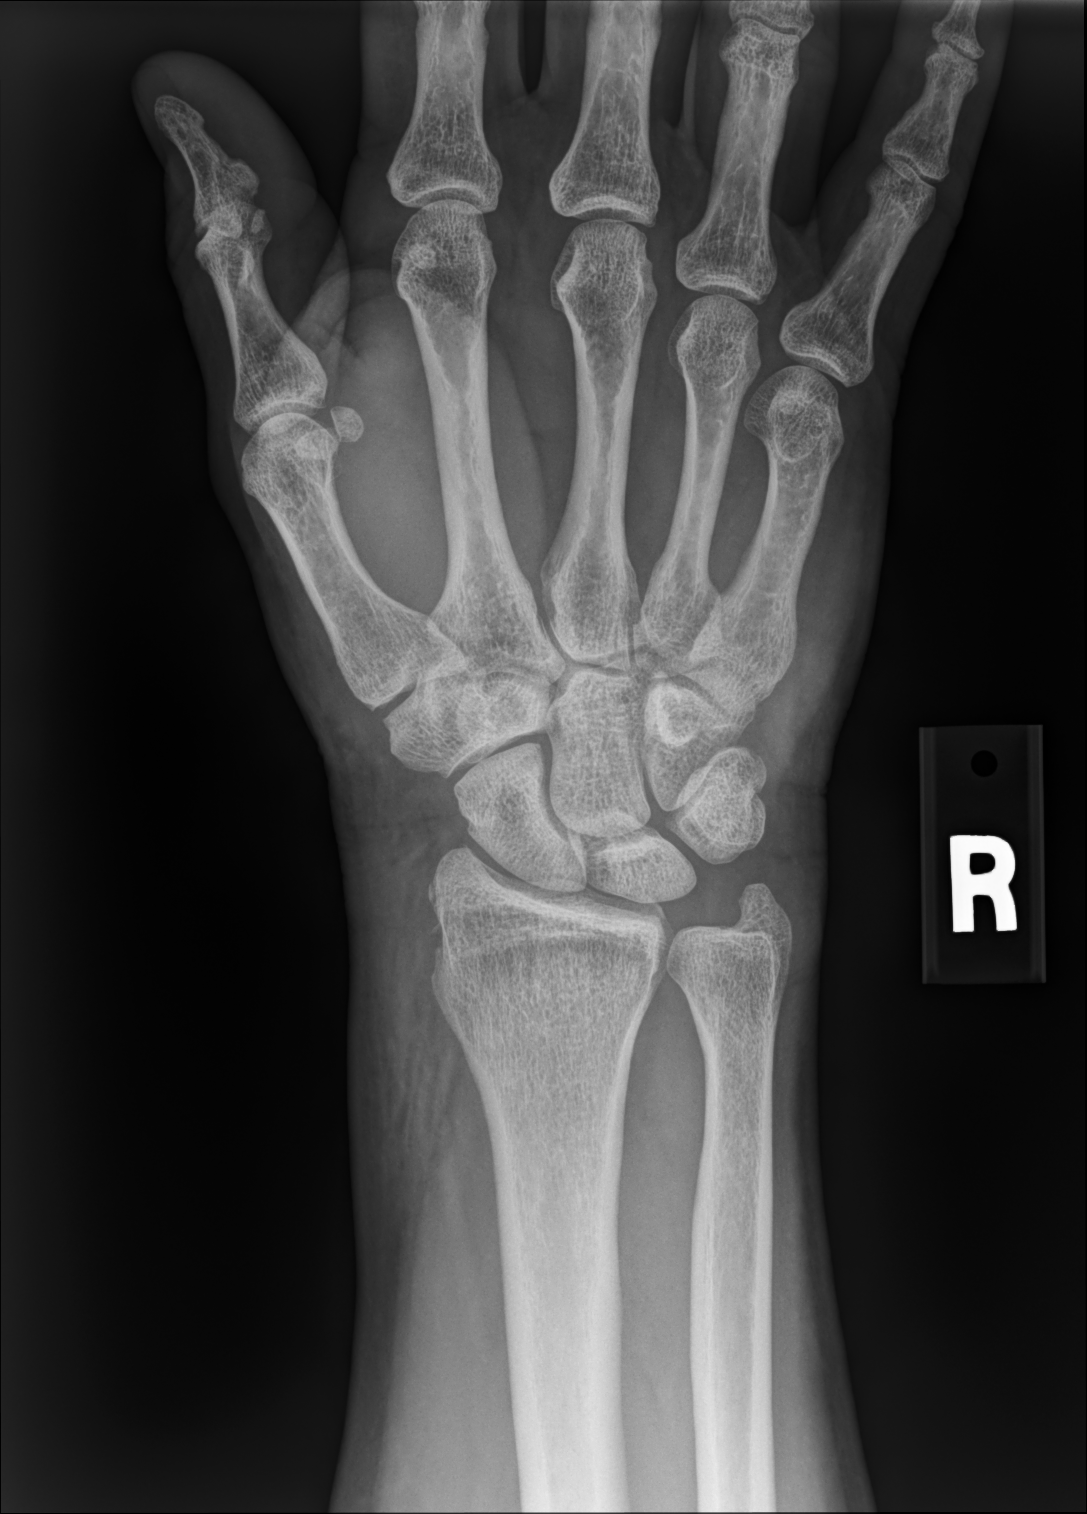

[scaphoid (os scaphoideum i) pa]
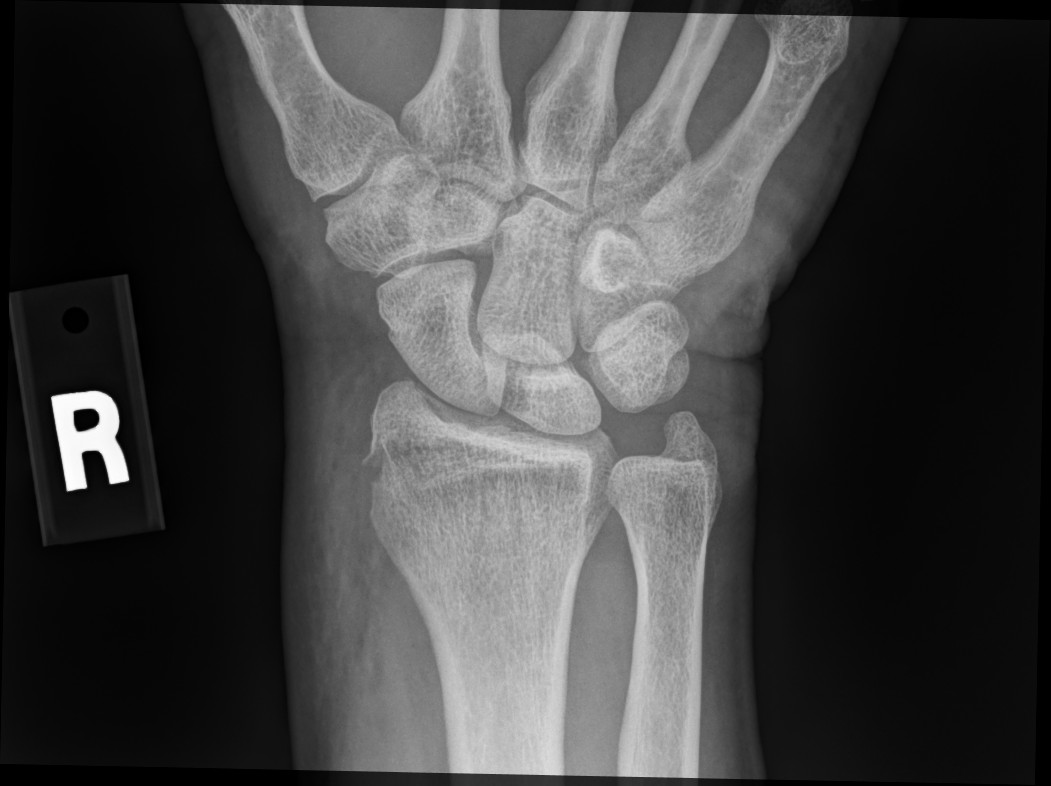

[wrist pa oblique]
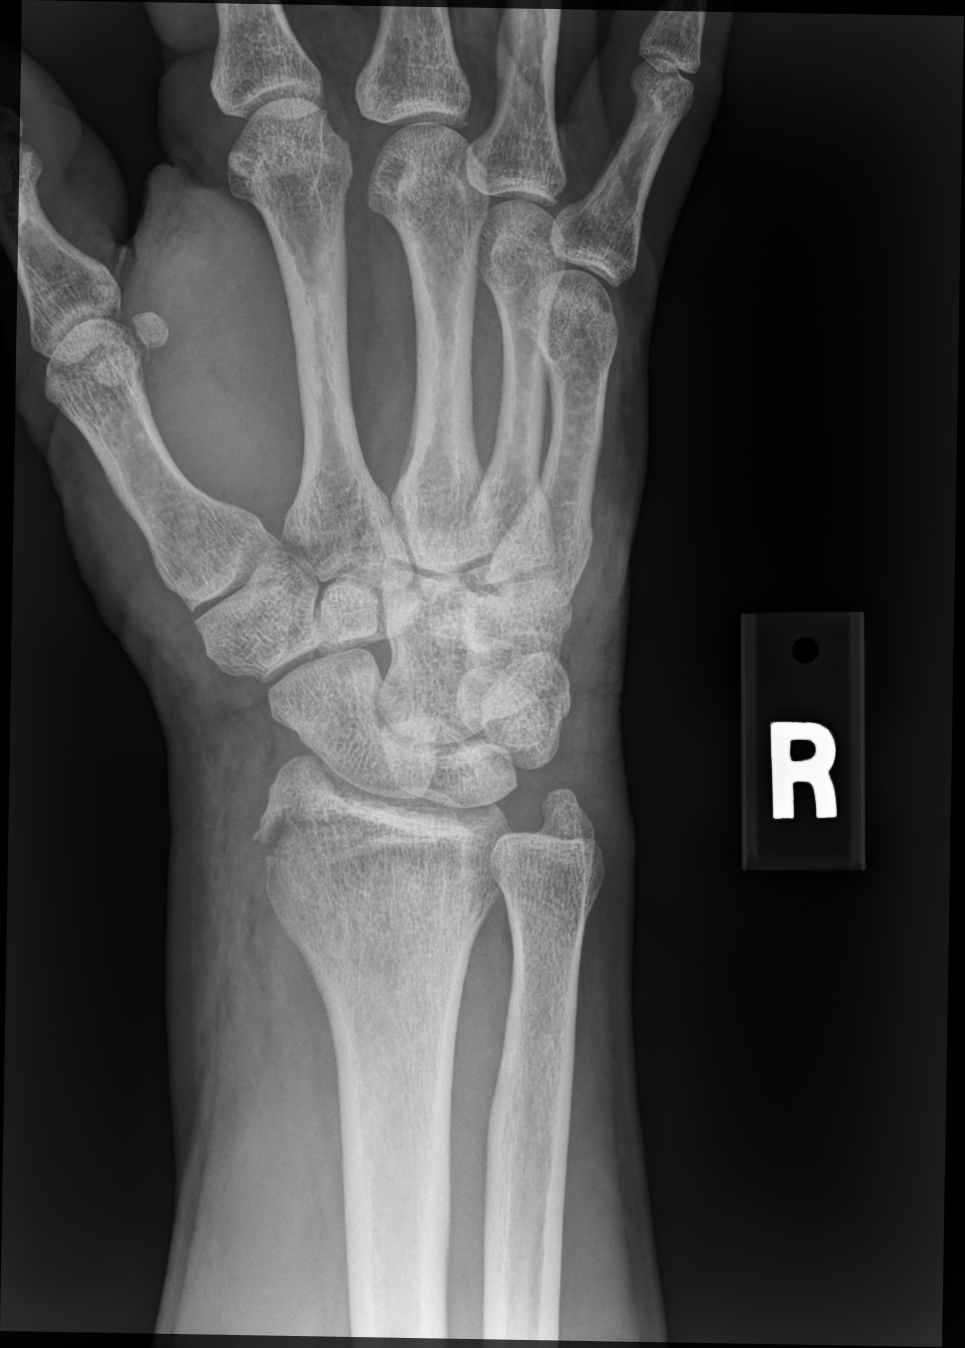

[wrist lat]
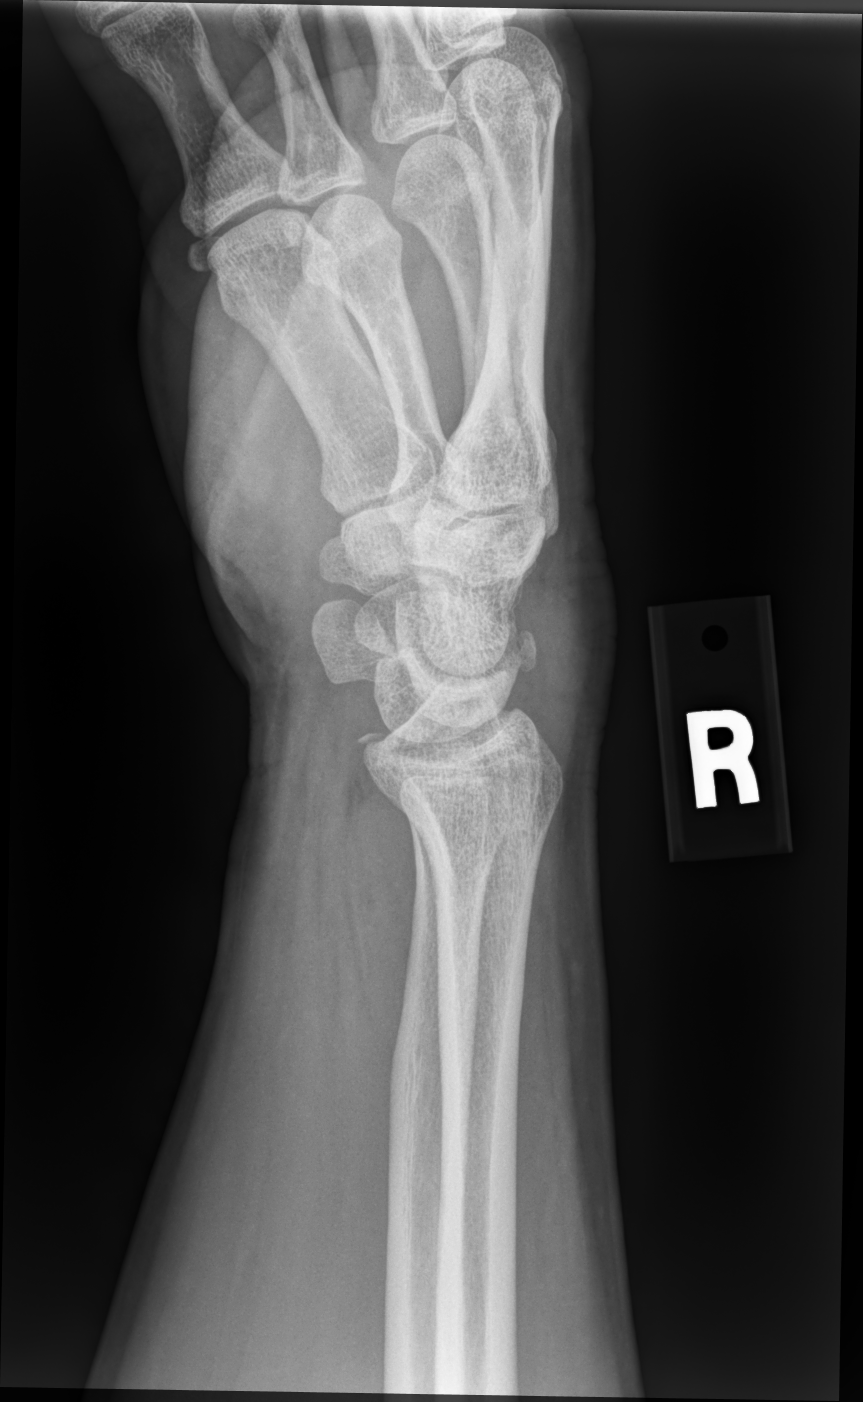

[4 of 4 positions shown; findings below may reference images not displayed]

FINDINGS: Minimally displaced but intra-articular fracture of the right radial
styloid is noted (image 3). Distal ulna is intact. Maintained carpal
bone alignment and joint spaces. No carpal or proximal metacarpal
fracture identified. Soft tissue swelling at the wrist.
IMPRESSION: Acute minimally displaced but intra-articular fracture of the right
radial styloid.

## 2023-06-17 ENCOUNTER — Other Ambulatory Visit: Payer: Self-pay | Admitting: Nurse Practitioner

## 2023-06-17 DIAGNOSIS — Z8249 Family history of ischemic heart disease and other diseases of the circulatory system: Secondary | ICD-10-CM

## 2023-06-17 DIAGNOSIS — E782 Mixed hyperlipidemia: Secondary | ICD-10-CM

## 2023-06-17 DIAGNOSIS — Z8241 Family history of sudden cardiac death: Secondary | ICD-10-CM

## 2023-07-10 ENCOUNTER — Ambulatory Visit
Admission: RE | Admit: 2023-07-10 | Discharge: 2023-07-10 | Disposition: A | Discharge status: Home / Self Care | Payer: No Typology Code available for payment source | Source: Ambulatory Visit | Attending: Nurse Practitioner | Admitting: Nurse Practitioner

## 2023-07-10 DIAGNOSIS — E782 Mixed hyperlipidemia: Secondary | ICD-10-CM

## 2023-07-10 DIAGNOSIS — Z8249 Family history of ischemic heart disease and other diseases of the circulatory system: Secondary | ICD-10-CM

## 2023-07-10 DIAGNOSIS — Z8241 Family history of sudden cardiac death: Secondary | ICD-10-CM

## 2024-03-02 ENCOUNTER — Ambulatory Visit (INDEPENDENT_AMBULATORY_CARE_PROVIDER_SITE_OTHER): Admitting: Family Medicine

## 2024-03-02 ENCOUNTER — Ambulatory Visit (INDEPENDENT_AMBULATORY_CARE_PROVIDER_SITE_OTHER)

## 2024-03-02 ENCOUNTER — Encounter (HOSPITAL_COMMUNITY): Payer: Self-pay

## 2024-03-02 ENCOUNTER — Ambulatory Visit (HOSPITAL_COMMUNITY): Admission: EM | Admit: 2024-03-02 | Discharge: 2024-03-02 | Disposition: A | Discharge status: Home / Self Care

## 2024-03-02 VITALS — BP 135/83 | Ht 68.5 in | Wt 200.0 lb

## 2024-03-02 DIAGNOSIS — S8392XA Sprain of unspecified site of left knee, initial encounter: Secondary | ICD-10-CM | POA: Diagnosis not present

## 2024-03-02 DIAGNOSIS — M25562 Pain in left knee: Secondary | ICD-10-CM | POA: Diagnosis not present

## 2024-03-02 MED ORDER — DICLOFENAC SODIUM 50 MG PO TBEC: 50.0000 mg | DELAYED_RELEASE_TABLET | Freq: Two times a day (BID) | ORAL | 1 refills | Status: DC

## 2024-03-02 NOTE — Progress Notes (Unsigned)
    SUBJECTIVE:   CHIEF COMPLAINT / HPI:   Jacob Black is a 63 year old M that presents for left knee pain - Was running Thursday, felt a pop in medial L knee when stepping down, and has had progressive pain since then.  - Was seen in UC today, and referred here for further evaluation - Has tried taking ibuprofen  once, but did not think it helped - Palpation is not really hurting at this point.  The pain is mostly exacerbated with weightbearing. - He owns a automated wheelchair which he occasionally uses for gout flares, and he has been using this wheelchair to get weight off of his left leg.   L Knee XR 6/30 at UC Minimal chronic enthesopathic change at the quadriceps insertion on the patella.  PERTINENT  PMH / PSH: Chronic hep C, gout  OBJECTIVE:   BP 135/83   Ht 5' 8.5 (1.74 m)   Wt 200 lb (90.7 kg)   BMI 29.97 kg/m   General: Alert, pleasant man. NAD. HEENT: NCAT. MMM.  Resp:  Normal WOB on RA.    Skin: Warm, well perfused  MSK: BL knee appear symmetric, no effusion. L knee TTP along infero-medial to patella; this pain is also exacerbated by weight-bearing on L LE. No TTP along medial or lateral joint lines or over patella.  Negative McMurray.  No varus or valgus laxity.  Negative anterior or posterior drawer sign.  ASSESSMENT/PLAN:   Assessment & Plan Acute pain of left knee Differential includes tibial plateau fracture or contusion. Less likely meniscal or ligamentous injury given negative exam. Imaging negative for displaced fractures.  - Cont Voltaren BID x7days (this was prescribed by UC) - Cont tylenol  prn - Icing, rest, supportive sleeve, and knee scooter - f/u in 1 wk      Twyla Nearing, MD [facility]

## 2024-03-02 NOTE — ED Provider Notes (Signed)
 [facility] [place]  Note:  This document was prepared using Dragon voice recognition software and may include unintentional dictation errors.  MRN: [id number] DOB: [birth year]  Subjective:   Jacob Black is a 63 y.o. male presenting for acute onset left knee pain.  Patient reports that he was jogging a few days ago when he stepped down off of the curb and felt a pop in his medial left knee.  Patient reports since that time ambulation and weightbearing has been increasingly painful.  Patient has taken ibuprofen  with minimal improvement to symptoms.  Patient denies any past trauma or knee injury.  Patient denies any improvement of pain however he states that the pain is not getting worse.  No current facility-administered medications for this encounter.  Current Outpatient Medications:    allopurinol (ZYLOPRIM) 100 MG tablet, Take 100 mg by mouth daily., Disp: , Rfl:    colchicine  0.6 MG tablet, TAKE 1 TABLET BY MOUTH DAILY FOR TWO-FOUR WEEKS WHILE WE START THE ALLOPURINOL, Disp: , Rfl:    diclofenac (VOLTAREN) 50 MG EC tablet, Take 1 tablet (50 mg total) by mouth 2 (two) times daily., Disp: 30 tablet, Rfl: 1   No Known Allergies  Past Medical History:  Diagnosis Date   Gout      Past Surgical History:  Procedure Laterality Date   FRACTURE SURGERY     LEG SURGERY      Family History  Problem Relation Age of Onset   Diabetes Mother    Heart disease Mother    Diabetes Father    Heart disease Father    Stroke Mother 19   Heart attack Father     Social History   Tobacco Use   Smoking status: Every Day    Current packs/day: 0.50    Average packs/day: 0.5 packs/day for 49.5 years (24.7 ttl pk-yrs)    Types: Cigarettes    Start date: 09/03/1974   Smokeless tobacco: Never  Vaping Use   Vaping status: Never Used  Substance Use Topics   Alcohol use: Yes    Comment: occasionally   Drug use: Never    ROS Refer to HPI for ROS details.  Objective:   Vitals: BP  120/68 (BP Location: Left Arm)   Pulse 66   Temp 97.9 F (36.6 C) (Oral)   Resp 18   Ht 5' 8 (1.727 m)   Wt 200 lb (90.7 kg)   SpO2 94%   BMI 30.41 kg/m   Physical Exam Vitals and nursing note reviewed.  Constitutional:      General: He is not in acute distress.    Appearance: Normal appearance. He is not ill-appearing or toxic-appearing.  HENT:     Head: Normocephalic.   Cardiovascular:     Rate and Rhythm: Normal rate.  Pulmonary:     Effort: Pulmonary effort is normal. No respiratory distress.   Musculoskeletal:     Left knee: Bony tenderness present. No swelling, deformity, erythema or crepitus. Normal range of motion. Tenderness present over the medial joint line. Normal pulse.   Skin:    General: Skin is warm and dry.     Capillary Refill: Capillary refill takes less than 2 seconds.   Neurological:     General: No focal deficit present.     Mental Status: He is alert and oriented to person, place, and time.   Psychiatric:        Mood and Affect: Mood normal.        Behavior: Behavior  normal.     Procedures  No results found for this or any previous visit (from the past 24 hours).  DG Knee Complete 4 Views Left Result Date: 03/02/2024 CLINICAL DATA:  Left knee pain starting 6 days ago. EXAM: LEFT KNEE - COMPLETE 4+ VIEW COMPARISON:  None Available. FINDINGS: Joint spaces are preserved. No joint effusion. Minimal chronic enthesopathic change at the quadriceps insertion on the patella. The cortices are intact. No acute fracture or dislocation. IMPRESSION: Minimal chronic enthesopathic change at the quadriceps insertion on the patella. Electronically Signed   By: Tanda Lyons M.D.   On: 03/02/2024 10:56     Assessment and Plan :     Discharge Instructions       1. Sprain of left knee, unspecified ligament, initial encounter (Primary) - DG Knee Complete 4 Views Left x-ray performed in UC shows no acute fracture or dislocation. - diclofenac (VOLTAREN) 50  MG EC tablet; Take 1 tablet (50 mg total) by mouth 2 (two) times daily for left knee pain and inflammation - AMB referral to sports medicine for follow-up evaluation of left knee and possible need for advanced imaging.      Camaron Cammack B Bowe Sidor   Azahel Belcastro, [place] B, [place] 03/02/24 1105

## 2024-03-02 NOTE — Discharge Instructions (Addendum)
  1. Sprain of left knee, unspecified ligament, initial encounter (Primary) - DG Knee Complete 4 Views Left x-ray performed in UC shows no acute fracture or dislocation. - diclofenac (VOLTAREN) 50 MG EC tablet; Take 1 tablet (50 mg total) by mouth 2 (two) times daily for left knee pain and inflammation - AMB referral to sports medicine for follow-up evaluation of left knee and possible need for advanced imaging.

## 2024-03-02 NOTE — ED Triage Notes (Signed)
 Patient presenting with left knee pain onset last Tuesday. Patient states he was out jogging when the pain started, no falls or known injuries. Patient walking with a visible limp.  Prescriptions or OTC medications tried: Yes- Ibuprofen    with little relief

## 2024-03-02 NOTE — Patient Instructions (Signed)
 You have a severe knee contusion, possibly a hairline fracture of your tibia. Use the knee scooter to get around as long as this is comfortable - try not to put any weight on this leg. Icing 15 minutes at a time 3-4 times a day. Compression sleeve to help with support during the day. Take the diclofenac 50mg  twice a day with food for pain and inflammation for 7 days then as needed Follow up with me in 7-10 days.

## 2024-03-03 ENCOUNTER — Encounter: Payer: Self-pay | Admitting: Family Medicine

## 2024-03-16 ENCOUNTER — Ambulatory Visit (INDEPENDENT_AMBULATORY_CARE_PROVIDER_SITE_OTHER): Admitting: Family Medicine

## 2024-03-16 VITALS — BP 138/86 | Ht 68.5 in | Wt 200.0 lb

## 2024-03-16 DIAGNOSIS — M25562 Pain in left knee: Secondary | ICD-10-CM

## 2024-03-16 NOTE — Patient Instructions (Signed)
 We will go ahead with an MRI of your knee to assess for a tibial plateau fracture vs meniscus tear. Icing 15 minutes at a time as needed. Diclofenac  twice a day with food as needed. Tylenol  as needed in addition to this. Use crutch or scooter to help get around. Set up a no charge visit to go over MRI results when these come back (this can be virtual).

## 2024-03-17 ENCOUNTER — Encounter: Payer: Self-pay | Admitting: Family Medicine

## 2024-03-17 NOTE — Progress Notes (Signed)
 PCP: Waylan Almarie SAUNDERS, MD  Subjective:   HPI: Patient is a 63 y.o. male here for left knee pain.  6/30: - Was running Thursday, felt a pop in medial L knee when stepping down, and has had progressive pain since then.  - Was seen in UC today, and referred here for further evaluation - Has tried taking ibuprofen  once, but did not think it helped - Palpation is not really hurting at this point.  The pain is mostly exacerbated with weightbearing. - He owns a automated wheelchair which he occasionally uses for gout flares, and he has been using this wheelchair to get weight off of his left leg.  7/14: Patient reports no change in his symptoms. Feels ok when sitting but fairly severe pain when bearing weight on left leg felt within left knee. Has been using crutches and his automated wheelchair. Tried tylenol , diclofenac . No new injuries. No locking, giving out.  Past Medical History:  Diagnosis Date   Gout     Current Outpatient Medications on File Prior to Visit  Medication Sig Dispense Refill   allopurinol (ZYLOPRIM) 100 MG tablet Take 100 mg by mouth daily.     colchicine  0.6 MG tablet TAKE 1 TABLET BY MOUTH DAILY FOR TWO-FOUR WEEKS WHILE WE START THE ALLOPURINOL     diclofenac  (VOLTAREN ) 50 MG EC tablet Take 1 tablet (50 mg total) by mouth 2 (two) times daily. 30 tablet 1   [DISCONTINUED] albuterol  (PROVENTIL  HFA;VENTOLIN  HFA) 108 (90 Base) MCG/ACT inhaler Inhale 2 puffs into the lungs every 6 (six) hours as needed for wheezing or shortness of breath. 1 Inhaler 0   [DISCONTINUED] febuxostat  (ULORIC ) 40 MG tablet Take 40 mg by mouth daily.     No current facility-administered medications on file prior to visit.    Past Surgical History:  Procedure Laterality Date   FRACTURE SURGERY     LEG SURGERY      No Known Allergies  BP 138/86   Ht 5' 8.5 (1.74 m)   Wt 200 lb (90.7 kg)   BMI 29.97 kg/m       No data to display              No data to display               Objective:  Physical Exam:  Gen: NAD, comfortable in exam room  Left knee: No gross deformity, ecchymoses, swelling. TTP laterally just below joint line on tibial plateau. FROM with normal strength. Negative ant/post drawers. Negative valgus/varus testing. Negative lachman. Negative mcmurrays, apleys. NV intact distally.   Assessment & Plan:  1. Left knee injury - initial radiographs negative.  Has not improved in past 2 weeks with lateral joint line/tibia pain.  Would expect improvement with contusion.  Will proceed with MRI to assess for nondisplaced tibial plateau fracture vs lateral meniscus tear.  Icing, diclofenac , tylenol .  Crutches or scooter and minimize weight bearing on this leg.

## 2024-03-24 ENCOUNTER — Ambulatory Visit
Admission: RE | Admit: 2024-03-24 | Discharge: 2024-03-24 | Disposition: A | Discharge status: Home / Self Care | Source: Ambulatory Visit | Attending: Family Medicine | Admitting: Family Medicine

## 2024-03-24 DIAGNOSIS — M25562 Pain in left knee: Secondary | ICD-10-CM

## 2024-03-25 ENCOUNTER — Ambulatory Visit: Payer: Self-pay | Admitting: Family Medicine

## 2024-05-19 NOTE — Progress Notes (Signed)
 Otolaryngology Clinic Note  HPI:    Follow-up (Pt presents today for 1 wk f/u.)   Jacob Black is a 63 y.o. 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  This ear drainage is a new symptom and he also reports muffled hearing he says that he has been using Ciprodex drops for 1 week duration and his hearing is still muffled but he has noticed that his hearing is 50% improved as well as the drainage 50% improved.  He is still noticing some yellow drainage from his right ear during at night.  Patient denies ear pain and prior visits with ENT was advised that he might consider surgical consult he did have Right ear tubes by Dr. Carlie in 2014 which extruded and left a right ear perforation.  PMH/Meds/All/SocHx/FamHx/ROS:   Medical History[1]  Surgical History[2]  No family history of bleeding disorders, wound healing problems or difficulty with anesthesia.   Social History   Socioeconomic History  . Marital status: Single    Spouse name: Not on file  . Number of children: Not on file  . Years of education: Not on file  . Highest education level: Not on file  Occupational History  . Not on file  Tobacco Use  . Smoking status: Every Day    Current packs/day: 0.50    Average packs/day: 1 pack/day for 52.7 years (51.9 ttl pk-yrs)    Types: Cigarettes    Start date: 20  . Smokeless tobacco: Never  . Tobacco comments:    Per 2024 LS form/chart (1) currently smokes 1/2 ppd   Vaping Use  . Vaping status: Never Used  Substance and Sexual Activity  . Alcohol use: Not Currently    Alcohol/week: 28.0 standard drinks of alcohol  . Drug use: No  . Sexual activity: Yes    Partners: Female  Other Topics Concern  . Not on file  Social History Narrative  . Not on file   Social Drivers of Health    Food Insecurity: Not on file  Transportation Needs: Not on file  Safety: Not on file  Living Situation: Not on file    Current Medications[3]  A complete ROS was performed with pertinent positives/negatives noted in the HPI. The remainder of the ROS are negative.    Physical Exam:    BP 112/74   Pulse 73   Temp 97.2 F (36.2 C) (Temporal)   Ht 1.753 m (5' 9)   Wt 92.4 kg (203 lb 12.8 oz)   BMI 30.10 kg/m    General: Well developed, well nourished. No acute distress. Voice normal.  Head/Face: Normocephalic, atraumatic. No scars or lesions. No sinus tenderness. Facial nerve intact and equal bilaterally. No facial lacerations. TMJ nontender to palpation.  Eyes: Pupils are equal, round and reactive to light. Conjunctiva and lids are normal. Normal extraocular mobility.   Ears:   Right: Pinna and external meatus normal, normal ear canal skin and caliber without excessive cerumen.  Some mild drainage and black debris noted as well as a 15% perforation noted at 4 o'clock position.     Left: Pinna and external meatus normal, normal ear canal skin and caliber without excessive cerumen or drainage. Tympanic membrane intact without effusion or infection.   Hearing: Normal speech reception to conversational tone.  Nose: No  gross deformity or lesions. No purulent discharge. Septum midline, no turbinate hypertrophy with normal mucosa.  Mouth/Oropharynx: Lips normal, teeth and gums normal with good dentition, normal oral vestibule. Normal floor of mouth, tongue and oral mucosa, no mucosal lesions, ulcer or mass, normal tongue mobility. Uvula midline. Hard and soft palate normal with normal mobility. +1 tonsils, no erythema or exudate.  Larynx: See TFL if applicable  Nasopharynx: See TFL if applicable  Neck: Trachea midline. No masses. No crepitus. Normal thyroid gland palpation without thyromegaly or nodules palpated. Salivary glands normal to palpation without swelling, erythema or mass.    Lymphatic: No lymphadenopathy in the neck.  Respiratory: No stridor or distress.  Extremities: No edema or cyanosis. Warm and well-perfused.  Neurologic: CN II-XII intact. Alert and oriented to self, place and time.  Normal reflexes and motor skills, balance and coordination. Moving all extremities without gross abnormality.  Psychiatric:  No unusual anxiety or evidence of depression. Appropriate affect.    Independent Review of Additional Tests or Records:   Medical Records: Reviewed prior ENT records from this office no external records reviewed.  Ear culture result was reviewed which shows growth positive for Staphylococcus and E. Coli.  Procedures:   Binocular Microscopy  Date/Time: 05/19/2024 3:00 PM  Performed by: Olam Vina Simpler, NP Authorized by: Olam Vina Simpler, NP   Consent:    Consent obtained:  Verbal   Consent given by:  Patient Comments:     Both ears reviewed visualized under otomicroscopy.  The left ear is normal both the left external auditory canal left tympanic membrane and middle ear space is normal.  The right ear has normal external auditory canal but there are areas of focal mucopurulent drainage with some wax debris he also has a 15% anterior perforation at the 4 o'clock position there is some presence of otorrhea but lessened from last visit.   Impression & Plans:  Jacob Black is a 63 y.o. male with   1. Otorrhea of right ear  ciprofloxacin-dexAMETHasone (CIPRODEX) 0.3-0.1 % otic suspension    2. Abnormal auditory perception of right ear      3. Perforation of right tympanic membrane       Assessment & Plan Patient has continued otorrhea of right ear and abnormal order auditory perception of right ear he does note a 50% improvement from his last visit 1 week ago and does his ability to hear from the right ear as well as the amount of drainage coming from the right ear advised patient to continue 1 more week of Ciprodex drops with new prescription  placed for patient and patient will return in 1 week for an ear check if his hearing is still muffled I will recommend for patient to get a repeat audiogram also discussed that patient does still have a right tympanic membrane perforation he may be interested in getting this surgically repaired with one of our ENT doctors likely Dr. Carlie since Dr. Carlie has seen this patient before for right tympanostomy tube placement.  Discussed with patient that we will take things stepwise approach and first ensure that his ear infection has resolved.  Medical Decision Making   Medical Decision Making Patient is here for a return patient visit for evaluation and treatment of:   A. 2 acute uncomplicated illness (right ear otorrhea and right ear abnormal auditory perception) + 1 stable chronic condition (right tympanic membrane perforation), level 3: 99213   B. Review of results:  1.  1 unique test was  reviewed: Right ear culture  Low level complexity for data for analysis and review. Level 2: [phone]  C: Risk of Complications: Prescription drug management Ciprodex drops were reviewed and patient advised to continue Ciprodex drops for another week duration, which poses a moderate risk of morbidity or mortality, level 4: 99214  Overall level of service: 99213   On the day of the visit I spent 22 minutes preparing to see the patient, ordering medications, test, or procedures, referring and communicating with other health care professionals (not separately reported), and documenting clinical information in the electronic medical record.This time does not include any time spent performing procedures or assesments that are separately billable.     Electronically signed by: Olam Vina Simpler, NP 05/19/2024 3:31 PM            [1] Past Medical History: Diagnosis Date  . Gout   [2] History reviewed. No pertinent surgical history. [3]  Current Outpatient Medications:  .  allopurinoL (ZYLOPRIM) 100 mg  tablet, , Disp: , Rfl:  .  allopurinoL (ZYLOPRIM) 100 mg tablet, Take 100 mg by mouth Once Daily., Disp: , Rfl:  .  budesonide  (PULMICORT ) 0.5 mg/2 mL nebulizer solution, 1 Respule into 240mL nasal saline irrigation, perform twice daily. (Patient not taking: Reported on 05/12/2024), Disp: 120 mL, Rfl: 11 .  ciprofloxacin-dexAMETHasone (CIPRODEX) 0.3-0.1 % otic suspension, 4 drops to the affected ear twice daily, Disp: 7.5 mL, Rfl: 1 .  ibuprofen  (MOTRIN ) 600 mg tablet, ibuprofen  600 mg tablet (Patient not taking: Reported on 05/12/2024), Disp: , Rfl:  .  ofloxacin (FLOXIN) 0.3 % otic solution, Administer 5 drops into the right ear 2 (two) times a day. (Patient not taking: Reported on 05/12/2024), Disp: 5 mL, Rfl: 0 .  rosuvastatin (CRESTOR) 10 mg tablet, , Disp: , Rfl:

## 2024-06-09 ENCOUNTER — Inpatient Hospital Stay (HOSPITAL_COMMUNITY)
Admission: EM | Admit: 2024-06-09 | Discharge: 2024-06-18 | DRG: 232 | Disposition: A | Discharge status: Home Health | Attending: Surgery | Admitting: Surgery

## 2024-06-09 ENCOUNTER — Encounter (HOSPITAL_COMMUNITY): Payer: Self-pay | Admitting: Cardiology

## 2024-06-09 ENCOUNTER — Inpatient Hospital Stay (HOSPITAL_COMMUNITY)
Admission: EM | Disposition: A | Discharge status: Home Health | Payer: Self-pay | Source: Home / Self Care | Attending: Surgery

## 2024-06-09 DIAGNOSIS — Z8249 Family history of ischemic heart disease and other diseases of the circulatory system: Secondary | ICD-10-CM

## 2024-06-09 DIAGNOSIS — I2102 ST elevation (STEMI) myocardial infarction involving left anterior descending coronary artery: Principal | ICD-10-CM | POA: Diagnosis present

## 2024-06-09 DIAGNOSIS — I2510 Atherosclerotic heart disease of native coronary artery without angina pectoris: Secondary | ICD-10-CM | POA: Diagnosis present

## 2024-06-09 DIAGNOSIS — M109 Gout, unspecified: Secondary | ICD-10-CM | POA: Diagnosis present

## 2024-06-09 DIAGNOSIS — F17210 Nicotine dependence, cigarettes, uncomplicated: Secondary | ICD-10-CM | POA: Diagnosis present

## 2024-06-09 DIAGNOSIS — J9811 Atelectasis: Secondary | ICD-10-CM | POA: Diagnosis present

## 2024-06-09 DIAGNOSIS — T502X5A Adverse effect of carbonic-anhydrase inhibitors, benzothiadiazides and other diuretics, initial encounter: Secondary | ICD-10-CM | POA: Diagnosis not present

## 2024-06-09 DIAGNOSIS — I2119 ST elevation (STEMI) myocardial infarction involving other coronary artery of inferior wall: Secondary | ICD-10-CM | POA: Diagnosis not present

## 2024-06-09 DIAGNOSIS — Z7982 Long term (current) use of aspirin: Secondary | ICD-10-CM

## 2024-06-09 DIAGNOSIS — I440 Atrioventricular block, first degree: Secondary | ICD-10-CM | POA: Diagnosis present

## 2024-06-09 DIAGNOSIS — E871 Hypo-osmolality and hyponatremia: Secondary | ICD-10-CM | POA: Diagnosis present

## 2024-06-09 DIAGNOSIS — D62 Acute posthemorrhagic anemia: Secondary | ICD-10-CM | POA: Diagnosis not present

## 2024-06-09 DIAGNOSIS — I252 Old myocardial infarction: Secondary | ICD-10-CM

## 2024-06-09 DIAGNOSIS — B182 Chronic viral hepatitis C: Secondary | ICD-10-CM | POA: Diagnosis present

## 2024-06-09 DIAGNOSIS — I110 Hypertensive heart disease with heart failure: Secondary | ICD-10-CM | POA: Diagnosis present

## 2024-06-09 DIAGNOSIS — F17200 Nicotine dependence, unspecified, uncomplicated: Secondary | ICD-10-CM | POA: Diagnosis not present

## 2024-06-09 DIAGNOSIS — Z951 Presence of aortocoronary bypass graft: Secondary | ICD-10-CM

## 2024-06-09 DIAGNOSIS — N179 Acute kidney failure, unspecified: Secondary | ICD-10-CM | POA: Diagnosis present

## 2024-06-09 DIAGNOSIS — Z823 Family history of stroke: Secondary | ICD-10-CM

## 2024-06-09 DIAGNOSIS — I2111 ST elevation (STEMI) myocardial infarction involving right coronary artery: Secondary | ICD-10-CM | POA: Diagnosis not present

## 2024-06-09 DIAGNOSIS — F1010 Alcohol abuse, uncomplicated: Secondary | ICD-10-CM | POA: Diagnosis present

## 2024-06-09 DIAGNOSIS — Z7902 Long term (current) use of antithrombotics/antiplatelets: Secondary | ICD-10-CM

## 2024-06-09 DIAGNOSIS — Z634 Disappearance and death of family member: Secondary | ICD-10-CM

## 2024-06-09 DIAGNOSIS — D72829 Elevated white blood cell count, unspecified: Secondary | ICD-10-CM | POA: Diagnosis present

## 2024-06-09 DIAGNOSIS — Z79899 Other long term (current) drug therapy: Secondary | ICD-10-CM

## 2024-06-09 DIAGNOSIS — E7800 Pure hypercholesterolemia, unspecified: Secondary | ICD-10-CM | POA: Diagnosis not present

## 2024-06-09 DIAGNOSIS — Z833 Family history of diabetes mellitus: Secondary | ICD-10-CM

## 2024-06-09 HISTORY — DX: Hyperlipidemia, unspecified: E78.5

## 2024-06-09 HISTORY — PX: CORONARY/GRAFT ACUTE MI REVASCULARIZATION: CATH118305

## 2024-06-09 LAB — I-STAT CHEM 8, ED
BUN: 20 mg/dL (ref 6–20)
Calcium, Ion: 1.1 mmol/L — ABNORMAL LOW (ref 1.15–1.40)
Chloride: 106 mmol/L (ref 98–111)
Creatinine, Ser: 1.6 mg/dL — ABNORMAL HIGH (ref 0.61–1.24)
Glucose, Bld: 119 mg/dL — ABNORMAL HIGH (ref 70–99)
HCT: 43 % (ref 39.0–52.0)
Hemoglobin: 14.6 g/dL (ref 13.0–17.0)
Potassium: 4 mmol/L (ref 3.5–5.1)
Sodium: 141 mmol/L (ref 135–145)
TCO2: 23 mmol/L (ref 22–32)

## 2024-06-09 SURGERY — CORONARY/GRAFT ACUTE MI REVASCULARIZATION
Anesthesia: LOCAL

## 2024-06-09 MED ORDER — MORPHINE SULFATE (PF) 2 MG/ML IV SOLN: 1.0000 mg | Freq: Once | INTRAVENOUS | Status: DC

## 2024-06-09 MED ORDER — HEPARIN SODIUM (PORCINE) 1000 UNIT/ML IJ SOLN
INTRAMUSCULAR | Status: AC
  Filled 2024-06-09: qty 10

## 2024-06-09 MED ORDER — FENTANYL CITRATE (PF) 100 MCG/2ML IJ SOLN
INTRAMUSCULAR | Status: AC
  Filled 2024-06-09: qty 2

## 2024-06-09 MED ORDER — HEPARIN SODIUM (PORCINE) 1000 UNIT/ML IJ SOLN
INTRAMUSCULAR | Status: DC | PRN
  Administered 2024-06-09: 10000 [IU] via INTRAVENOUS

## 2024-06-09 MED ORDER — FENTANYL CITRATE (PF) 100 MCG/2ML IJ SOLN
INTRAMUSCULAR | Status: DC | PRN
  Administered 2024-06-09: 25 ug via INTRAVENOUS

## 2024-06-09 MED ORDER — LIDOCAINE HCL (PF) 1 % IJ SOLN
INTRAMUSCULAR | Status: AC
Start: 2024-06-09 — End: 2024-06-09
  Filled 2024-06-09: qty 30

## 2024-06-09 MED ORDER — VERAPAMIL HCL 2.5 MG/ML IV SOLN
INTRAVENOUS | Status: DC | PRN
  Administered 2024-06-09: 10 mL via INTRA_ARTERIAL

## 2024-06-09 MED ORDER — HEPARIN BOLUS VIA INFUSION: 4000.0000 [IU] | Freq: Once | INTRAVENOUS | Status: DC

## 2024-06-09 MED ORDER — LIDOCAINE HCL (PF) 1 % IJ SOLN
INTRAMUSCULAR | Status: DC | PRN
  Administered 2024-06-09: 2 mL via INTRADERMAL
  Administered 2024-06-10: 5 mL via INTRADERMAL

## 2024-06-09 MED ORDER — VERAPAMIL HCL 2.5 MG/ML IV SOLN
INTRAVENOUS | Status: AC
  Filled 2024-06-09: qty 2

## 2024-06-09 MED ORDER — HEPARIN (PORCINE) IN NACL 1000-0.9 UT/500ML-% IV SOLN
INTRAVENOUS | Status: DC | PRN
  Administered 2024-06-09 (×2): 500 mL

## 2024-06-09 MED ORDER — HEPARIN SODIUM (PORCINE) 5000 UNIT/ML IJ SOLN
4000.0000 [IU] | Freq: Once | INTRAMUSCULAR | Status: AC
  Administered 2024-06-09: 4000 [IU] via INTRAVENOUS

## 2024-06-09 MED ORDER — MIDAZOLAM HCL 2 MG/2ML IJ SOLN
INTRAMUSCULAR | Status: DC | PRN
  Administered 2024-06-09: 2 mg via INTRAVENOUS

## 2024-06-09 MED ORDER — MIDAZOLAM HCL 2 MG/2ML IJ SOLN
INTRAMUSCULAR | Status: AC
  Filled 2024-06-09: qty 2

## 2024-06-09 SURGICAL SUPPLY — 15 items
BALLOON EMERGE MR 3.0X15 (BALLOONS) IMPLANT
BALLOON IABP SENS PLUS 8F 50CC (BALLOONS) IMPLANT
CATH INFINITI AMBI 5FR TG (CATHETERS) IMPLANT
CATH LAUNCHER 6FR JR4 (CATHETERS) IMPLANT
COVER PRB 48X5XTLSCP FOLD TPE (BAG) IMPLANT
DEVICE RAD COMP TR BAND LRG (VASCULAR PRODUCTS) IMPLANT
GLIDESHEATH SLEND A-KIT 6F 22G (SHEATH) IMPLANT
GUIDEWIRE INQWIRE 1.5J.035X260 (WIRE) IMPLANT
KIT ENCORE 26 ADVANTAGE (KITS) IMPLANT
KIT HEMO VALVE WATCHDOG (MISCELLANEOUS) IMPLANT
PACK CARDIAC CATHETERIZATION (CUSTOM PROCEDURE TRAY) ×2 IMPLANT
SET ATX-X65L (MISCELLANEOUS) IMPLANT
SHEATH PINNACLE 9F 10CM (SHEATH) IMPLANT
WIRE MICRO SET SILHO 5FR 7 (SHEATH) IMPLANT
WIRE RUNTHROUGH .014X180CM (WIRE) IMPLANT

## 2024-06-09 NOTE — ED Notes (Signed)
 Pt transported to cath lab accompanied by Benton BRAVO RN, Dr. Ladona and Dr. Marty

## 2024-06-09 NOTE — ED Notes (Signed)
 4000u heparin given

## 2024-06-09 NOTE — ED Triage Notes (Signed)
 Per em: pt reports central non-radiating chest pain onset tonight around 6pm. STEMI activated in the field by EMS. He received 324 aspirin and 2 nitro sublingual tablets.  18left AC  BP- 1656/100, HR-62, 98% RA

## 2024-06-09 NOTE — H&P (Signed)
 CARDIOLOGY ADMIT NOTE     Patient ID: DOLLIE MAYSE MRN: [id number] DOB/AGE: [birth year] 63 y.o.  Admit date: 06/09/2024.   Primary Physician:  [name], MD  Patient ID: Ozell JONELLE Barge, male    DOB: August 28, 1961, 63 y.o.   MRN: [id number]  Chief Complaint  Patient presents with  . Code STEMI   HPI:    OMARRION CARMER  is a 63 y.o. ***  Past Medical History:  Diagnosis Date  . Gout    Past Surgical History:  Procedure Laterality Date  . FRACTURE SURGERY    . LEG SURGERY     Social History   Tobacco Use  . Smoking status: Every Day    Current packs/day: 0.50    Average packs/day: 0.5 packs/day for 49.8 years (24.9 ttl pk-yrs)    Types: Cigarettes    Start date: 09/03/1974  . Smokeless tobacco: Never  Substance Use Topics  . Alcohol use: Yes    Comment: occasionally   Family History  Problem Relation Age of Onset  . Diabetes Mother   . Heart disease Mother   . Diabetes Father   . Heart disease Father   . Stroke Mother 81  . Heart attack Father     ROS  ***ROS Objective      06/09/2024   11:31 PM 06/09/2024   11:29 PM 03/16/2024    1:38 PM  Vitals with BMI  Height   5' 8.5  Weight  195 lbs 200 lbs  BMI   29.96  Systolic 123  138  Diastolic 86  86  Pulse 59        ***Physical Exam Laboratory examination:   *** Recent Labs    06/09/24 2338  NA 141  K 4.0  CL 106  GLUCOSE 119*  BUN 20  CREATININE 1.60*   estimated creatinine clearance is 53.6 mL/min (A) (by C-G formula based on SCr of 1.6 mg/dL (H)).     Latest Ref Rng & Units 06/09/2024   11:38 PM 04/20/2022    5:40 AM 11/21/2016    3:52 AM  CMP  Glucose 70 - 99 mg/dL 880  875  738   BUN 6 - 20 mg/dL 20  24  12    Creatinine 0.61 - 1.24 mg/dL 8.39  8.81  8.47   Sodium 135 - 145 mmol/L 141  140  134   Potassium 3.5 - 5.1 mmol/L 4.0  3.9  3.3   Chloride 98 - 111 mmol/L 106  110  100   CO2 22 - 32 mmol/L  23  19   Calcium 8.9 - 10.3 mg/dL  9.4  9.0   Total Protein 6.5 - 8.1  g/dL   7.2   Total Bilirubin 0.3 - 1.2 mg/dL   0.4   Alkaline Phos 38 - 126 U/L   54   AST 15 - 41 U/L   30   ALT 17 - 63 U/L   21       Latest Ref Rng & Units 06/09/2024   11:38 PM 04/20/2022    5:40 AM 11/21/2016    3:52 AM  CBC  WBC 4.0 - 10.5 K/uL  7.1  11.7   Hemoglobin 13.0 - 17.0 g/dL 85.3  86.4  86.8   Hematocrit 39.0 - 52.0 % 43.0  40.4  39.0   Platelets 150 - 400 K/uL  237  203    Lipid Panel  No results found for: CHOL, TRIG, HDL, CHOLHDL, VLDL, LDLCALC,  LDLDIRECT HEMOGLOBIN A1C No results found for: HGBA1C, MPG TSH No results for input(s): TSH in the last 8760 hours. BNP (last 3 results) No results for input(s): BNP in the last 8760 hours. Cardiac Panel (last 3 results) No results for input(s): CKTOTAL, CKMB, TROPONINIHS, RELINDX in the last 72 hours.   Medications and allergies  No Known Allergies   Prior to Admission medications   Medication Sig Start Date End Date Taking? Authorizing Provider  colchicine  0.6 MG tablet TAKE 1 TABLET BY MOUTH DAILY FOR TWO-FOUR WEEKS WHILE WE START THE ALLOPURINOL   Yes [provider]  allopurinol (ZYLOPRIM) 100 MG tablet Take 100 mg by mouth daily.    [provider]  diclofenac  (VOLTAREN ) 50 MG EC tablet Take 1 tablet (50 mg total) by mouth 2 (two) times daily. 03/02/24   Reddick, Johnathan B, NP  albuterol  (PROVENTIL  HFA;VENTOLIN  HFA) 108 (90 Base) MCG/ACT inhaler Inhale 2 puffs into the lungs every 6 (six) hours as needed for wheezing or shortness of breath. 11/21/16 05/06/20  Fairy Frames, MD  febuxostat  (ULORIC ) 40 MG tablet Take 40 mg by mouth daily.  05/06/20  [provider]      Current Outpatient Medications  Medication Instructions  . allopurinol (ZYLOPRIM) 100 mg, Daily  . colchicine  0.6 MG tablet TAKE 1 TABLET BY MOUTH DAILY FOR TWO-FOUR WEEKS WHILE WE START THE ALLOPURINOL  . diclofenac  (VOLTAREN ) 50 mg, Oral, 2 times daily    No intake/output data  recorded. No intake/output data recorded.    Radiology:  No results found.  Cardiac Studies:   EKG 06/09/2024: Normal sinus rhythm with rate of 62 bpm, ST elevation in the inferior leads and reciprocal ST depression in the anterior leads and lateral leads.  Acute inferior STEMI.  Assessment   1.  Acute inferior STEMI 2.  Hypercholesterolemia not on therapy 3.  Tobacco use disorder 4.  Recommendations:   Patient with ongoing chest pain, will proceed with cardiac catheterization.    Gordy Bergamo, MD, [facility] 06/09/2024, 11:45 PM [facility] [address] [place], [name] [address] Phone: [phone]. Fax:  [phone]

## 2024-06-09 NOTE — ED Notes (Signed)
 [name] at bedside

## 2024-06-10 ENCOUNTER — Encounter (HOSPITAL_COMMUNITY): Payer: Self-pay | Admitting: Cardiology

## 2024-06-10 ENCOUNTER — Inpatient Hospital Stay (HOSPITAL_COMMUNITY)

## 2024-06-10 DIAGNOSIS — D72829 Elevated white blood cell count, unspecified: Secondary | ICD-10-CM | POA: Diagnosis present

## 2024-06-10 DIAGNOSIS — Z9861 Coronary angioplasty status: Secondary | ICD-10-CM

## 2024-06-10 DIAGNOSIS — Z634 Disappearance and death of family member: Secondary | ICD-10-CM | POA: Diagnosis not present

## 2024-06-10 DIAGNOSIS — M109 Gout, unspecified: Secondary | ICD-10-CM | POA: Diagnosis present

## 2024-06-10 DIAGNOSIS — T502X5A Adverse effect of carbonic-anhydrase inhibitors, benzothiadiazides and other diuretics, initial encounter: Secondary | ICD-10-CM | POA: Diagnosis not present

## 2024-06-10 DIAGNOSIS — I2510 Atherosclerotic heart disease of native coronary artery without angina pectoris: Secondary | ICD-10-CM | POA: Diagnosis present

## 2024-06-10 DIAGNOSIS — N179 Acute kidney failure, unspecified: Secondary | ICD-10-CM | POA: Diagnosis present

## 2024-06-10 DIAGNOSIS — Z8249 Family history of ischemic heart disease and other diseases of the circulatory system: Secondary | ICD-10-CM | POA: Diagnosis not present

## 2024-06-10 DIAGNOSIS — I440 Atrioventricular block, first degree: Secondary | ICD-10-CM | POA: Diagnosis present

## 2024-06-10 DIAGNOSIS — I110 Hypertensive heart disease with heart failure: Secondary | ICD-10-CM | POA: Diagnosis present

## 2024-06-10 DIAGNOSIS — E871 Hypo-osmolality and hyponatremia: Secondary | ICD-10-CM | POA: Diagnosis present

## 2024-06-10 DIAGNOSIS — J9811 Atelectasis: Secondary | ICD-10-CM | POA: Diagnosis present

## 2024-06-10 DIAGNOSIS — I2119 ST elevation (STEMI) myocardial infarction involving other coronary artery of inferior wall: Secondary | ICD-10-CM | POA: Diagnosis present

## 2024-06-10 DIAGNOSIS — Z720 Tobacco use: Secondary | ICD-10-CM

## 2024-06-10 DIAGNOSIS — I2102 ST elevation (STEMI) myocardial infarction involving left anterior descending coronary artery: Secondary | ICD-10-CM | POA: Diagnosis present

## 2024-06-10 DIAGNOSIS — I252 Old myocardial infarction: Secondary | ICD-10-CM | POA: Diagnosis not present

## 2024-06-10 DIAGNOSIS — E785 Hyperlipidemia, unspecified: Secondary | ICD-10-CM | POA: Diagnosis not present

## 2024-06-10 DIAGNOSIS — Z95811 Presence of heart assist device: Secondary | ICD-10-CM

## 2024-06-10 DIAGNOSIS — Z79899 Other long term (current) drug therapy: Secondary | ICD-10-CM | POA: Diagnosis not present

## 2024-06-10 DIAGNOSIS — Z7902 Long term (current) use of antithrombotics/antiplatelets: Secondary | ICD-10-CM | POA: Diagnosis not present

## 2024-06-10 DIAGNOSIS — Z833 Family history of diabetes mellitus: Secondary | ICD-10-CM | POA: Diagnosis not present

## 2024-06-10 DIAGNOSIS — F1010 Alcohol abuse, uncomplicated: Secondary | ICD-10-CM | POA: Diagnosis present

## 2024-06-10 DIAGNOSIS — I2111 ST elevation (STEMI) myocardial infarction involving right coronary artery: Secondary | ICD-10-CM | POA: Diagnosis present

## 2024-06-10 DIAGNOSIS — Z7982 Long term (current) use of aspirin: Secondary | ICD-10-CM | POA: Diagnosis not present

## 2024-06-10 DIAGNOSIS — D62 Acute posthemorrhagic anemia: Secondary | ICD-10-CM | POA: Diagnosis not present

## 2024-06-10 DIAGNOSIS — B182 Chronic viral hepatitis C: Secondary | ICD-10-CM | POA: Diagnosis present

## 2024-06-10 DIAGNOSIS — Z01810 Encounter for preprocedural cardiovascular examination: Secondary | ICD-10-CM | POA: Diagnosis not present

## 2024-06-10 DIAGNOSIS — F17210 Nicotine dependence, cigarettes, uncomplicated: Secondary | ICD-10-CM | POA: Diagnosis present

## 2024-06-10 DIAGNOSIS — Z823 Family history of stroke: Secondary | ICD-10-CM | POA: Diagnosis not present

## 2024-06-10 DIAGNOSIS — E7800 Pure hypercholesterolemia, unspecified: Secondary | ICD-10-CM | POA: Diagnosis present

## 2024-06-10 LAB — POCT I-STAT, CHEM 8
BUN: 17 mg/dL (ref 6–20)
Calcium, Ion: 1.22 mmol/L (ref 1.15–1.40)
Chloride: 106 mmol/L (ref 98–111)
Creatinine, Ser: 1.2 mg/dL (ref 0.61–1.24)
Glucose, Bld: 139 mg/dL — ABNORMAL HIGH (ref 70–99)
HCT: 38 % — ABNORMAL LOW (ref 39.0–52.0)
Hemoglobin: 12.9 g/dL — ABNORMAL LOW (ref 13.0–17.0)
Potassium: 3.5 mmol/L (ref 3.5–5.1)
Sodium: 140 mmol/L (ref 135–145)
TCO2: 20 mmol/L — ABNORMAL LOW (ref 22–32)

## 2024-06-10 LAB — BASIC METABOLIC PANEL WITH GFR
Anion gap: 14 (ref 5–15)
BUN: 18 mg/dL (ref 6–20)
CO2: 17 mmol/L — ABNORMAL LOW (ref 22–32)
Calcium: 9 mg/dL (ref 8.9–10.3)
Chloride: 106 mmol/L (ref 98–111)
Creatinine, Ser: 1.18 mg/dL (ref 0.61–1.24)
GFR, Estimated: >60 mL/min (ref >60)
Glucose, Bld: 133 mg/dL — ABNORMAL HIGH (ref 70–99)
Potassium: 3.7 mmol/L (ref 3.5–5.1)
Sodium: 137 mmol/L (ref 135–145)

## 2024-06-10 LAB — ECHOCARDIOGRAM COMPLETE
Area-P 1/2: 3.61 cm2
S' Lateral: 3 cm
Weight: 3241.64 [oz_av]

## 2024-06-10 LAB — LACTIC ACID, PLASMA: Lactic Acid, Venous: 1.1 mmol/L (ref 0.5–1.9)

## 2024-06-10 LAB — LIPID PANEL
Cholesterol: 202 mg/dL — ABNORMAL HIGH (ref 0–200)
HDL: 40 mg/dL — ABNORMAL LOW (ref >40)
LDL Cholesterol: 111 mg/dL — ABNORMAL HIGH (ref 0–99)
Total CHOL/HDL Ratio: 5.1 ratio
Triglycerides: 254 mg/dL — ABNORMAL HIGH (ref <150)
VLDL: 51 mg/dL — ABNORMAL HIGH (ref 0–40)

## 2024-06-10 LAB — CBC
HCT: 39.3 % (ref 39.0–52.0)
Hemoglobin: 13.3 g/dL (ref 13.0–17.0)
MCH: 32 pg (ref 26.0–34.0)
MCHC: 33.8 g/dL (ref 30.0–36.0)
MCV: 94.5 fL (ref 80.0–100.0)
Platelets: 214 K/uL (ref 150–400)
RBC: 4.16 MIL/uL — ABNORMAL LOW (ref 4.22–5.81)
RDW: 12.6 % (ref 11.5–15.5)
WBC: 8.6 K/uL (ref 4.0–10.5)
nRBC: 0 % (ref 0.0–0.2)

## 2024-06-10 LAB — CG4 I-STAT (LACTIC ACID): Lactic Acid, Venous: 2.7 mmol/L (ref 0.5–1.9)

## 2024-06-10 LAB — POCT ACTIVATED CLOTTING TIME
Activated Clotting Time: 158 s
Activated Clotting Time: 354 s

## 2024-06-10 LAB — MRSA NEXT GEN BY PCR, NASAL: MRSA by PCR Next Gen: NOT DETECTED

## 2024-06-10 LAB — HEMOGLOBIN A1C
Hgb A1c MFr Bld: 5.2 % (ref 4.8–5.6)
Mean Plasma Glucose: 102.54 mg/dL

## 2024-06-10 LAB — TSH: TSH: 2.26 u[IU]/mL (ref 0.350–4.500)

## 2024-06-10 MED ORDER — ROSUVASTATIN CALCIUM 20 MG PO TABS
40.0000 mg | ORAL_TABLET | Freq: Once | ORAL | Status: AC
  Administered 2024-06-10: 40 mg via ORAL
  Filled 2024-06-10: qty 2

## 2024-06-10 MED ORDER — NITROGLYCERIN 1 MG/10 ML FOR IR/CATH LAB
INTRA_ARTERIAL | Status: DC | PRN
  Administered 2024-06-10: 200 ug via INTRACORONARY

## 2024-06-10 MED ORDER — HEPARIN (PORCINE) 25000 UT/250ML-% IV SOLN
1000.0000 [IU]/h | INTRAVENOUS | Status: DC
  Administered 2024-06-10: 1000 [IU]/h via INTRAVENOUS
  Filled 2024-06-10: qty 250

## 2024-06-10 MED ORDER — NITROGLYCERIN 1 MG/10 ML FOR IR/CATH LAB
INTRA_ARTERIAL | Status: AC
  Filled 2024-06-10: qty 10

## 2024-06-10 MED ORDER — STERILE WATER FOR INJECTION IJ SOLN
INTRAMUSCULAR | Status: AC
Start: 2024-06-10 — End: 2024-06-10
  Filled 2024-06-10: qty 10

## 2024-06-10 MED ORDER — LORAZEPAM 2 MG/ML IJ SOLN: 1.0000 mg | INTRAMUSCULAR | Status: DC | PRN

## 2024-06-10 MED ORDER — SODIUM CHLORIDE 0.9 % IV SOLN: 250.0000 mL | INTRAVENOUS | Status: AC | PRN

## 2024-06-10 MED ORDER — SODIUM CHLORIDE 0.9 % IV SOLN
INTRAVENOUS | Status: AC | PRN
  Administered 2024-06-10: 250 mL via INTRAVENOUS

## 2024-06-10 MED ORDER — SODIUM CHLORIDE 0.9 % IV SOLN
0.7500 ug/kg/min | INTRAVENOUS | Status: DC
  Administered 2024-06-11 – 2024-06-12 (×4): 0.75 ug/kg/min via INTRAVENOUS
  Filled 2024-06-10 (×6): qty 50

## 2024-06-10 MED ORDER — SODIUM CHLORIDE 0.9 % IV SOLN
0.7500 ug/kg/min | INTRAVENOUS | Status: DC
  Administered 2024-06-10 (×2): 4 ug/kg/min via INTRAVENOUS
  Filled 2024-06-10 (×6): qty 50

## 2024-06-10 MED ORDER — IOHEXOL 350 MG/ML SOLN
INTRAVENOUS | Status: DC | PRN
  Administered 2024-06-10: 65 mL

## 2024-06-10 MED ORDER — HYDRALAZINE HCL 25 MG PO TABS
25.0000 mg | ORAL_TABLET | Freq: Three times a day (TID) | ORAL | Status: DC
  Administered 2024-06-10 – 2024-06-11 (×2): 25 mg via ORAL
  Filled 2024-06-10 (×2): qty 1

## 2024-06-10 MED ORDER — SODIUM CHLORIDE 0.9% FLUSH: 3.0000 mL | INTRAVENOUS | Status: DC | PRN

## 2024-06-10 MED ORDER — CANGRELOR BOLUS VIA INFUSION: INTRAVENOUS | Status: DC | PRN

## 2024-06-10 MED ORDER — METOPROLOL SUCCINATE ER 25 MG PO TB24
25.0000 mg | ORAL_TABLET | Freq: Every day | ORAL | Status: AC
  Administered 2024-06-10 – 2024-06-12 (×3): 25 mg via ORAL
  Filled 2024-06-10 (×3): qty 1

## 2024-06-10 MED ORDER — ISOSORBIDE DINITRATE 10 MG PO TABS
10.0000 mg | ORAL_TABLET | Freq: Two times a day (BID) | ORAL | Status: DC
  Filled 2024-06-10: qty 1

## 2024-06-10 MED ORDER — THIAMINE HCL 100 MG/ML IJ SOLN
100.0000 mg | Freq: Every day | INTRAMUSCULAR | Status: DC
  Filled 2024-06-10 (×2): qty 2

## 2024-06-10 MED ORDER — SODIUM CHLORIDE 0.9 % IV SOLN
INTRAVENOUS | Status: AC | PRN
  Administered 2024-06-10: .75 ug/kg/min via INTRAVENOUS

## 2024-06-10 MED ORDER — FOLIC ACID 1 MG PO TABS
1.0000 mg | ORAL_TABLET | Freq: Every day | ORAL | Status: DC
  Administered 2024-06-10 – 2024-06-18 (×8): 1 mg via ORAL
  Filled 2024-06-10 (×8): qty 1

## 2024-06-10 MED ORDER — FENTANYL CITRATE PF 50 MCG/ML IJ SOSY
25.0000 ug | PREFILLED_SYRINGE | Freq: Once | INTRAMUSCULAR | Status: AC
  Administered 2024-06-10: 25 ug via INTRAVENOUS
  Filled 2024-06-10: qty 1

## 2024-06-10 MED ORDER — ADULT MULTIVITAMIN W/MINERALS CH
1.0000 | ORAL_TABLET | Freq: Every day | ORAL | Status: DC
  Administered 2024-06-10 – 2024-06-18 (×8): 1 via ORAL
  Filled 2024-06-10 (×8): qty 1

## 2024-06-10 MED ORDER — ONDANSETRON HCL 4 MG/2ML IJ SOLN: 4.0000 mg | Freq: Four times a day (QID) | INTRAMUSCULAR | Status: DC | PRN

## 2024-06-10 MED ORDER — CHLORHEXIDINE GLUCONATE CLOTH 2 % EX PADS
6.0000 | MEDICATED_PAD | Freq: Every day | CUTANEOUS | Status: DC
  Administered 2024-06-10 – 2024-06-12 (×3): 6 via TOPICAL

## 2024-06-10 MED ORDER — HYDRALAZINE HCL 20 MG/ML IJ SOLN: 5.0000 mg | INTRAMUSCULAR | Status: AC | PRN

## 2024-06-10 MED ORDER — CANGRELOR BOLUS VIA INFUSION
INTRAVENOUS | Status: DC | PRN
  Administered 2024-06-10: 2655 ug via INTRAVENOUS

## 2024-06-10 MED ORDER — LORAZEPAM 1 MG PO TABS: 1.0000 mg | ORAL_TABLET | ORAL | Status: DC | PRN

## 2024-06-10 MED ORDER — CANGRELOR TETRASODIUM 50 MG IV SOLR
INTRAVENOUS | Status: AC
  Filled 2024-06-10: qty 50

## 2024-06-10 MED ORDER — THIAMINE MONONITRATE 100 MG PO TABS
100.0000 mg | ORAL_TABLET | Freq: Every day | ORAL | Status: DC
  Administered 2024-06-10 – 2024-06-18 (×8): 100 mg via ORAL
  Filled 2024-06-10 (×8): qty 1

## 2024-06-10 MED ORDER — ASPIRIN 81 MG PO CHEW
81.0000 mg | CHEWABLE_TABLET | Freq: Every day | ORAL | Status: DC
Start: 2024-06-11 — End: 2024-06-13
  Administered 2024-06-11 – 2024-06-12 (×2): 81 mg via ORAL
  Filled 2024-06-10 (×2): qty 1

## 2024-06-10 MED ORDER — ROSUVASTATIN CALCIUM 20 MG PO TABS
40.0000 mg | ORAL_TABLET | Freq: Every day | ORAL | Status: DC
  Administered 2024-06-10 – 2024-06-18 (×8): 40 mg via ORAL
  Filled 2024-06-10 (×8): qty 2

## 2024-06-10 MED ORDER — ACETAMINOPHEN 325 MG PO TABS
650.0000 mg | ORAL_TABLET | ORAL | Status: DC | PRN
  Administered 2024-06-10 (×2): 650 mg via ORAL
  Filled 2024-06-10 (×2): qty 2

## 2024-06-10 MED ORDER — HEPARIN (PORCINE) 25000 UT/250ML-% IV SOLN
1400.0000 [IU]/h | INTRAVENOUS | Status: DC
  Administered 2024-06-11: 1200 [IU]/h via INTRAVENOUS
  Administered 2024-06-12 – 2024-06-13 (×2): 1400 [IU]/h via INTRAVENOUS
  Filled 2024-06-10 (×3): qty 250

## 2024-06-10 MED ORDER — ISOSORBIDE DINITRATE 10 MG PO TABS
10.0000 mg | ORAL_TABLET | Freq: Three times a day (TID) | ORAL | Status: DC
  Administered 2024-06-10 (×2): 10 mg via ORAL
  Filled 2024-06-10 (×6): qty 1

## 2024-06-10 MED ORDER — SODIUM CHLORIDE 0.9% FLUSH
3.0000 mL | Freq: Two times a day (BID) | INTRAVENOUS | Status: DC
Start: 2024-06-10 — End: 2024-06-13
  Administered 2024-06-10 – 2024-06-12 (×6): 3 mL via INTRAVENOUS

## 2024-06-10 MED ORDER — POTASSIUM CHLORIDE CRYS ER 20 MEQ PO TBCR
40.0000 meq | EXTENDED_RELEASE_TABLET | Freq: Once | ORAL | Status: AC
  Administered 2024-06-10: 40 meq via ORAL
  Filled 2024-06-10: qty 2

## 2024-06-10 MED ORDER — LABETALOL HCL 5 MG/ML IV SOLN: 10.0000 mg | INTRAVENOUS | Status: AC | PRN

## 2024-06-10 MED FILL — Lidocaine HCl Local Preservative Free (PF) Inj 1%: INTRAMUSCULAR | Qty: 30 | Status: AC

## 2024-06-10 NOTE — H&P (Signed)
 Cardiology Admission History and Physical   Patient ID: ROWIN BAYRON MRN: [id number]; DOB: 04-08-1961   Admission date: 06/09/2024  PCP:  [name], MD   [facility] Providers Cardiologist:  None       Chief Complaint:  chest pain  Patient Profile: Jacob Black is a 63 y.o. male with a past medical history of gout, alcohol use, hyperlipidemia, TUD who is being seen 06/10/2024 for the evaluation of STEMI.  History of Present Illness: Mr. Abdalla developed acute onset severe chest pain just before dinner around 5-6 pm. This was in the context of 3 alcoholic drinks. EMS was called after 20 minutes and cath lab was activated given inferoposterolateral STEMI noted on EKG. He was hemodynamically and electrically stable and received 1 NGL en route with good effect. No further nitroglycerin given on arrival given inferior STEMI. He was not loaded with P2Y12i. Heparin 4000u given in ED. In the cath lab he was noted to have severe MV disease including distal LM, critical proximal LCX, prox LAD, as well as a mid RCA culprit for his STEMI. The culprit was managed with POBA and stenting was deferred given need for CABG. He was loaded on cangrelor and an IABP was placed and he was transferred to the CCU in stable condition with resolution of CP and ST segments.     Past Medical History:  Diagnosis Date   Gout    Hyperlipidemia    Past Surgical History:  Procedure Laterality Date   FRACTURE SURGERY     LEG SURGERY       Medications Prior to Admission: Prior to Admission medications   Medication Sig Start Date End Date Taking? Authorizing Provider  colchicine  0.6 MG tablet TAKE 1 TABLET BY MOUTH DAILY FOR TWO-FOUR WEEKS WHILE WE START THE ALLOPURINOL   Yes [provider]  allopurinol (ZYLOPRIM) 100 MG tablet Take 100 mg by mouth daily.    [provider]  diclofenac  (VOLTAREN ) 50 MG EC tablet Take 1 tablet (50 mg total) by mouth 2 (two) times daily.  03/02/24   Reddick, Johnathan B, NP  albuterol  (PROVENTIL  HFA;VENTOLIN  HFA) 108 (90 Base) MCG/ACT inhaler Inhale 2 puffs into the lungs every 6 (six) hours as needed for wheezing or shortness of breath. 11/21/16 05/06/20  Fairy Frames, MD  febuxostat  (ULORIC ) 40 MG tablet Take 40 mg by mouth daily.  05/06/20  [provider]     Allergies:   No Known Allergies  Social History:   Social History   Socioeconomic History   Marital status: Married    Spouse name: Not on file   Number of children: Not on file   Years of education: Not on file   Highest education level: Not on file  Occupational History   Not on file  Tobacco Use   Smoking status: Every Day    Current packs/day: 0.50    Average packs/day: 0.5 packs/day for 49.8 years (24.9 ttl pk-yrs)    Types: Cigarettes    Start date: 09/03/1974   Smokeless tobacco: Never  Vaping Use   Vaping status: Never Used  Substance and Sexual Activity   Alcohol use: Yes    Comment: occasionally   Drug use: Never   Sexual activity: Not on file  Other Topics Concern   Not on file  Social History Narrative   ** Merged History Encounter **       Social Drivers of Health   Financial Resource Strain: Not on file  Food Insecurity: Not on file  Transportation Needs: Not on file  Physical Activity: Not on file  Stress: Not on file  Social Connections: Not on file  Intimate Partner Violence: Not on file     Family History:   The patient's family history includes Diabetes in his father and mother; Heart attack in his father; Heart disease in his father and mother; Stroke (age of onset: 5) in his mother.    ROS:  Please see the history of present illness.  All other ROS reviewed and negative.     Physical Exam/Data: Vitals:   06/10/24 0033 06/10/24 0038 06/10/24 0043 06/10/24 0104  BP: 117/77 124/72 (!) 130/98   Pulse: 61 64 (!) 0 (!) 0  Resp: 15 14    SpO2: 100% 97%    Weight:       No intake or output data in the 24 hours  ending 06/10/24 0113    06/09/2024   11:29 PM 03/16/2024    1:38 PM 03/02/2024    4:09 PM  Last 3 Weights  Weight (lbs) 195 lb 200 lb 200 lb  Weight (kg) 88.451 kg 90.719 kg 90.719 kg     Body mass index is 29.22 kg/m.  General:  Well nourished, well developed, in no acute distress HEENT: normal Neck: no JVD Vascular: No carotid bruits; Distal pulses 2+ bilaterally   Cardiac:  normal S1, S2; RRR; no murmur  Lungs:  clear to auscultation bilaterally, no wheezing, rhonchi or rales  Abd: soft, nontender, no hepatomegaly  Ext: no edema Musculoskeletal:  No deformities, BUE and BLE strength normal and equal Skin: warm and dry  Neuro:  CNs 2-12 intact, no focal abnormalities noted Psych:  Normal affect   EKG:  The ECG that was done was personally reviewed and demonstrates STEMI (initial EKG)  Relevant CV Studies:   Laboratory Data: High Sensitivity Troponin:  No results for input(s): TROPONINIHS in the last 720 hours.    Chemistry Recent Labs  Lab 06/09/24 2338 06/10/24 0003  NA 141 140  K 4.0 3.5  CL 106 106  GLUCOSE 119* 139*  BUN 20 17  CREATININE 1.60* 1.20    No results for input(s): PROT, ALBUMIN, AST, ALT, ALKPHOS, BILITOT in the last 168 hours. Lipids No results for input(s): CHOL, TRIG, HDL, LABVLDL, LDLCALC, CHOLHDL in the last 168 hours. Hematology Recent Labs  Lab 06/09/24 2338 06/10/24 0003  HGB 14.6 12.9*  HCT 43.0 38.0*   Thyroid No results for input(s): TSH, FREET4 in the last 168 hours. BNPNo results for input(s): BNP, PROBNP in the last 168 hours.  DDimer No results for input(s): DDIMER in the last 168 hours.  Radiology/Studies:  CARDIAC CATHETERIZATION Result Date: 06/10/2024 Images from the original result were not included. Cardiac Catheterization 06/10/24: Hemodynamic data: LV 148/10, EDP 26 mmHg.  Ao 143/90, mean 114 mmHg.  No pressure gradient across aortic valve. Angiographic data: LV: LVEF 45 to 50% with  inferior hypokinesis to akinesis. Left Main: Distal left main 40% stenosis, hazy. LAD: Proximal/ostial LAD has a complex 99% stenosis.  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  D1 is very large with a ostial 80% stenosis.  Mid to distal LAD has a 40% lesion.  LAD gives collaterals to distal RCA. LCx: Gives origin to high OM1.  Ostial Cx has a 99% stenosis and OM1 with ostial 99% stenosis and a proximal 80% stenosis.  Cx continues as a large OM 2 and is disease-free. RCA: Very large caliber  vessel.  Mid segment has a ulcerated and mildly thrombotic 99% stenosis with TIMI I-II flow with a large PL branch and a large PDA. Intervention data: Successful balloon angioplasty with establishing TIMI-3 flow into the mid RCA with a 3.0 x 15 mm emerge at 6 atmospheric pressure.  TIMI-3 flow was established with complete resolution of chest pain and ST elevations.  Intra-aortic balloon pump placed, patient started on heparin and cangrelor, needs CABG. Impression and recommendations: Patient to be started on IV heparin tonight, intra-aortic balloon pump support, left message to Dr. Redell Fellers, patient is stable from cardiac standpoint presently.  Will keep him n.p.o.     Assessment and Plan: STEMI and multivessel coronary disease The mid RCA culprit was managed with POBA and stenting was deferred given need for CABG.  - s/p POBA - maintain IABP 1:1 - maintain cangrelor infusion - ASA 81 daily - start rosuva 40 daily; if LDL >55 consider zetia - Lp(a) and lipid panel - ACEi before discharge if hypertensive - CT surgery already contacted, will assess tomorrow - echo in AM  2. History of alcohol use Reports 2-3 drinks per night - CIWA   Risk Assessment/Risk Scores:   TIMI Risk Score for ST  Elevation MI:   The patient's TIMI risk score is 1  , which indicates a 1.6 % risk of all cause mortality at 30 days.{  Code Status: Full Code  Severity of Illness: The appropriate patient status  for this patient is INPATIENT. Inpatient status is judged to be reasonable and necessary in order to provide the required intensity of service to ensure the patient's safety. The patient's presenting symptoms, physical exam findings, and initial radiographic and laboratory data in the context of their chronic comorbidities is felt to place them at high risk for further clinical deterioration. Furthermore, it is not anticipated that the patient will be medically stable for discharge from the hospital within 2 midnights of admission.   * I certify that at the point of admission it is my clinical judgment that the patient will require inpatient hospital care spanning beyond 2 midnights from the point of admission due to high intensity of service, high risk for further deterioration and high frequency of surveillance required.*  For questions or updates, please contact [facility] Please consult [email or web address] for contact info under   CRITICAL CARE TIME Performed by: Dorn CHRISTELLA Flemings, MD   Total critical care time: 30 minutes  Critical care time was exclusive of separately billable procedures and treating other patients.  Critical care was necessary to treat or prevent imminent or life-threatening deterioration.  Critical care was time spent personally by me on the following activities: development of treatment plan with patient and/or surrogate as well as nursing, discussions with consultants, evaluation of patient's response to treatment, examination of patient, obtaining history from patient or surrogate, ordering and performing treatments and interventions, ordering and review of laboratory studies, ordering and review of radiographic studies, pulse oximetry and re-evaluation of patient's condition. Signed, Dorn CHRISTELLA Flemings, MD  06/10/2024 1:13 AM

## 2024-06-10 NOTE — Progress Notes (Signed)
 PHARMACY - ANTICOAGULATION CONSULT NOTE  Pharmacy Consult for Heparin  Indication: Multi-vessel CAD, s/p cath, awaiting CVTS consult, IABP in place  No Known Allergies  Patient Measurements: Weight: 91.9 kg (202 lb 9.6 oz)  Vital Signs: Temp: 98 F (36.7 C) (10/08 0115) Temp Source: Oral (10/08 0115) BP: 130/98 (10/08 0043) Pulse Rate: 221 (10/08 0445)  Labs: Recent Labs    06/09/24 2338 06/10/24 0003 06/10/24 0331  HGB 14.6 12.9* 13.3  HCT 43.0 38.0* 39.3  PLT  --   --  214  CREATININE 1.60* 1.20 1.18    Estimated Creatinine Clearance: 73.9 mL/min (by C-G formula based on SCr of 1.18 mg/dL).   Medical History: Past Medical History:  Diagnosis Date   Gout    Hyperlipidemia     Assessment: 63 y/o M CODE STEMI, s/p cath, found to have multi-vessel CAD, awaiting CVTS consult later today  IABP placed in cath lab  TR band is now off  Goal of Therapy:  Heparin level 0.3-0.5 units/ml Monitor platelets by anticoagulation protocol: Yes   Plan:  Start heparin at 1000 units/hr Heparin level in 6-8 hours Daily CBC/heparin level Monitor for bleeding F/U CVTS consult  Lynwood Mckusick, PharmD, BCPS Clinical Pharmacist Phone: [phone]

## 2024-06-10 NOTE — Progress Notes (Signed)
 Chaplain responded to Code Stemi by meeting pt's wife in ED family waiting area and escorting her to [facility] waiting area.  Chaplain informed Cath Lab of wife's presence and contact number, as well as notifying the unit pt's wife was in the waiting area.  [place] Chaplain

## 2024-06-10 NOTE — Progress Notes (Addendum)
   IABP wean 1:3. X 3 hours. Asymptomatic.   Discussed with Dr Zenaida.   Plan to remove IABP. IABP back to 1:1  Hold heparin and cangrelor x 2 hours.  Plan to remove IABP at 1500  Cath lab aware.   Dianna Ewald NP-C  12:48 PM

## 2024-06-10 NOTE — Progress Notes (Signed)
 PHARMACY - ANTICOAGULATION CONSULT NOTE  Pharmacy Consult for Heparin  Indication: Multi-vessel CAD, s/p cath, awaiting CVTS consult, IABP in place  No Known Allergies  Patient Measurements: Weight: 91.9 kg (202 lb 9.6 oz)  Vital Signs: Temp: 97.8 F (36.6 C) (10/08 1600) Temp Source: Oral (10/08 1600) BP: 116/67 (10/08 1700) Pulse Rate: 60 (10/08 1700)  Labs: Recent Labs    06/09/24 2338 06/10/24 0003 06/10/24 0331  HGB 14.6 12.9* 13.3  HCT 43.0 38.0* 39.3  PLT  --   --  214  CREATININE 1.60* 1.20 1.18    Estimated Creatinine Clearance: 73.9 mL/min (by C-G formula based on SCr of 1.18 mg/dL).   Medical History: Past Medical History:  Diagnosis Date   Gout    Hyperlipidemia     Assessment: 63 y/o M presented 10/7 as CODE STEMI, s/p LHC revealing multi-vessel CAD with successful balloon angioplasty to mid RCA.  IABP placed, started on heparin and cangrelor, and TCTS consulted for CABG workup.    TCTS tentatively planning CABG on Saturday 10/11 pending final decision by surgeon.   Heparin / cangrelor paused for IABP removal today at ~1600.  Site stable per RN, no bleeding.  OK to restart heparin and cangrelor ~4h per HF MD.    Goal of Therapy:  Heparin level 0.3-0.5 units/ml Monitor platelets by anticoagulation protocol: Yes   Plan:  Restart cangrelor IV 0.75 mcg/kg/min @ 2030 Restart heparin IV 1000 units/hr @2030  Heparin level in 6 hours Daily CBC/heparin level Monitor for bleeding F/u OR timing  Maurilio Fila, PharmD Clinical Pharmacist 06/10/2024  6:30 PM

## 2024-06-10 NOTE — Progress Notes (Signed)
  Echocardiogram 2D Echocardiogram has been performed.  Tinnie FORBES Gosling RDCS 06/10/2024, 9:26 AM

## 2024-06-10 NOTE — Consult Note (Signed)
 Advanced Heart Failure Rounding Note  Cardiologist: None  Chief Complaint: STEMI Subjective:   Admitted with STEMI- Severe multivessel disease. IABP. CT surgery consulted.   Denies chest pain.   Objective:   Weight Range: 91.9 kg Body mass index is 30.36 kg/m.   Vital Signs:   Temp:  [98 F (36.7 C)] 98 F (36.7 C) (10/08 0115) Pulse Rate:  [0-227] 92 (10/08 0730) Resp:  [10-31] 18 (10/08 0730) BP: (91-147)/(61-98) 130/98 (10/08 0043) SpO2:  [90 %-100 %] 96 % (10/08 0730) Weight:  [88.5 kg-91.9 kg] 91.9 kg (10/08 0115) Last BM Date :  (PTA)  Weight change: Filed Weights   06/09/24 2329 06/10/24 0115  Weight: 88.5 kg 91.9 kg    Intake/Output:   Intake/Output Summary (Last 24 hours) at 06/10/2024 0755 Last data filed at 06/10/2024 0700 Gross per 24 hour  Intake 576.05 ml  Output 725 ml  Net -148.95 ml      Physical Exam  General:   No resp difficulty Neck: no JVD.  Cor: Regular rate & rhythm.  Lungs: clear Abdomen: soft, nontender, nondistended.  Extremities: no  edema. RF IABP. Site ok.  Neuro: alert & oriented x3   Telemetry   SR  EKG    N/A   Labs    CBC Recent Labs    06/10/24 0003 06/10/24 0331  WBC  --  8.6  HGB 12.9* 13.3  HCT 38.0* 39.3  MCV  --  94.5  PLT  --  214   Basic Metabolic Panel Recent Labs    89/91/74 0003 06/10/24 0331  NA 140 137  K 3.5 3.7  CL 106 106  CO2  --  17*  GLUCOSE 139* 133*  BUN 17 18  CREATININE 1.20 1.18  CALCIUM  --  9.0   Liver Function Tests No results for input(s): AST, ALT, ALKPHOS, BILITOT, PROT, ALBUMIN in the last 72 hours. No results for input(s): LIPASE, AMYLASE in the last 72 hours. Cardiac Enzymes No results for input(s): CKTOTAL, CKMB, CKMBINDEX, TROPONINI in the last 72 hours.  BNP: BNP (last 3 results) No results for input(s): BNP in the last 8760 hours.  ProBNP (last 3 results) No results for input(s): PROBNP in the last 8760  hours.   D-Dimer No results for input(s): DDIMER in the last 72 hours. Hemoglobin A1C No results for input(s): HGBA1C in the last 72 hours. Fasting Lipid Panel Recent Labs    06/10/24 0331  CHOL 202*  HDL 40*  LDLCALC 111*  TRIG 254*  CHOLHDL 5.1   Thyroid Function Tests Recent Labs    06/10/24 0331  TSH 2.260    Other results:   Imaging    DG CHEST PORT 1 VIEW Result Date: 06/10/2024 CLINICAL DATA:  Intra-aortic balloon pump. EXAM: PORTABLE CHEST 1 VIEW COMPARISON:  None Available. FINDINGS: The heart size and mediastinal contours are within normal limits. Both lungs are clear. The visualized skeletal structures are unremarkable. IMPRESSION: No active disease. Electronically Signed   By: Norleen DELENA Kil M.D.   On: 06/10/2024 06:36   CARDIAC CATHETERIZATION Result Date: 06/10/2024 Images from the original result were not included. Cardiac Catheterization 06/10/24: Hemodynamic data: LV 148/10, EDP 26 mmHg.  Ao 143/90, mean 114 mmHg.  No pressure gradient across aortic valve. Angiographic data: LV: LVEF 45 to 50% with inferior hypokinesis to akinesis. Left Main: Distal left main 40% stenosis, hazy. LAD: Proximal/ostial LAD has a complex 99% stenosis.  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  D1 is very large with a ostial 80% stenosis.  Mid to distal LAD has a 40% lesion.  LAD gives collaterals to distal RCA. LCx: Gives origin to high OM1.  Ostial Cx has a 99% stenosis and OM1 with ostial 99% stenosis and a proximal 80% stenosis.  Cx continues as a large OM 2 and is disease-free. RCA: Very large caliber vessel.  Mid segment has a ulcerated and mildly thrombotic 99% stenosis with TIMI I-II flow with a large PL branch and a large PDA. Intervention data: Successful balloon angioplasty with establishing TIMI-3 flow into the mid RCA with a 3.0 x 15 mm emerge at 6 atmospheric pressure.  TIMI-3 flow was established with complete resolution of chest pain and ST elevations.   Intra-aortic balloon pump placed, patient started on heparin and cangrelor, needs CABG. Impression and recommendations: Patient to be started on IV heparin tonight, intra-aortic balloon pump support, left message to Dr. Redell Fellers, patient is stable from cardiac standpoint presently.  Will keep him n.p.o.     Medications:     Scheduled Medications:  [START ON 06/11/2024] aspirin  81 mg Oral Daily   folic acid  1 mg Oral Daily    morphine injection  1 mg Intravenous Once   multivitamin with minerals  1 tablet Oral Daily   nitroGLYCERIN       rosuvastatin  40 mg Oral Daily   sodium chloride  flush  3 mL Intravenous Q12H   thiamine  100 mg Oral Daily   Or   thiamine  100 mg Intravenous Daily    Infusions:  sodium chloride  Stopped (06/10/24 0631)   cangrelor (KENGREAL) 50 mg in sodium chloride  0.9 % 250 mL (0.2 mg/mL) infusion 4 mcg/kg/min (06/10/24 0700)   heparin 1,000 Units/hr (06/10/24 0700)    PRN Medications: sodium chloride , acetaminophen , LORazepam **OR** LORazepam, nitroGLYCERIN, ondansetron  (ZOFRAN ) IV, sodium chloride  flush    Patient Profile   Admitted with STEMI. Plan for  CABG   Assessment/Plan  STEMI Cath with severe multivessel disease. Ballon Angioplasty to RCA IABP placed. On Cangrelor.  Wean IABP today. Plan to keep Cangrelor until surgery. CT Surgery consulted with tentative plan for CABG 10/11  HTN  Elevated. Add hydralazine 25  tid and isordil 10 mg tid.   Tobacco Abuse Discussed cessation.   Length of Stay: 0  Greig Mosses, NP  06/10/2024, 7:55 AM  Advanced Heart Failure Team Pager [phone] (M-F; 7a - 5p)  Please contact [facility] for night-coverage after hours (5p -7a ) and weekends on [email or web address]

## 2024-06-10 NOTE — Progress Notes (Signed)
 Orthopedic Tech Progress Note Patient Details:  Jacob Black 1961/08/06 [id number] Applied knee immobilizer per order.  Ortho Devices Type of Ortho Device: Knee Immobilizer Ortho Device/Splint Location: RLE Ortho Device/Splint Interventions: Ordered, Application, Adjustment   Post Interventions Patient Tolerated: Well Instructions Provided: Adjustment of device, Care of device, Poper ambulation with device  Morna Pink 06/10/2024, 2:07 AM

## 2024-06-10 NOTE — Plan of Care (Signed)
  Problem: Education: Goal: Knowledge of General Education information will improve Description: Including pain rating scale, medication(s)/side effects and non-pharmacologic comfort measures Outcome: Progressing   Problem: Health Behavior/Discharge Planning: Goal: Ability to manage health-related needs will improve Outcome: Progressing   Problem: Clinical Measurements: Goal: Ability to maintain clinical measurements within normal limits will improve Outcome: Progressing Goal: Will remain free from infection Outcome: Progressing Goal: Diagnostic test results will improve Outcome: Progressing Goal: Respiratory complications will improve Outcome: Progressing   Problem: Nutrition: Goal: Adequate nutrition will be maintained Outcome: Progressing   Problem: Coping: Goal: Level of anxiety will decrease Outcome: Progressing   Problem: Elimination: Goal: Will not experience complications related to bowel motility Outcome: Progressing Goal: Will not experience complications related to urinary retention Outcome: Progressing   Problem: Pain Managment: Goal: General experience of comfort will improve and/or be controlled Outcome: Progressing   Problem: Safety: Goal: Ability to remain free from injury will improve Outcome: Progressing   Problem: Skin Integrity: Goal: Risk for impaired skin integrity will decrease Outcome: Progressing

## 2024-06-10 NOTE — TOC Initial Note (Signed)
 Transition of Care [facility]) - Initial/Assessment Note    Patient Details  Name: Jacob Black MRN: [id number] Date of Birth: 10/15/1960  [facility] CM/SW Contact:    Jacob Black Phone Number: [phone] 06/10/2024, 12:46 PM  Clinical Narrative:     HF CSW met with patient at bedside. Patient stated that he lives with Jacob Black. Patient stated that he has no history of HH services. Patient stated that he does not use any equipment. Patient stated that he has a scale at home. Patient stated that he has a PCP. CSW explained that a hospital follow up appointment will be scheduled closer towards dc. Prefers morning appointment.   HF CSW/CM will continue to follow and monitor for dc readiness.                     Patient Goals and CMS Choice            Expected Discharge Plan and Services                                              Prior Living Arrangements/Services                       Activities of Daily Living      Permission Sought/Granted                  Emotional Assessment              Admission diagnosis:  Acute inferior myocardial infarction [facility]) [I21.19] Patient Active Problem List   Diagnosis Date Noted   Acute inferior myocardial infarction (HCC) 06/10/2024   Triple vessel coronary artery disease 06/10/2024   Acute ST elevation myocardial infarction (STEMI) of inferior wall (HCC) 06/09/2024   Hypercholesteremia 06/09/2024   Chronic hepatitis C without hepatic coma (HCC) 08/10/2015   Gout 08/10/2015   PCP:  Jacob Almarie JONELLE, MD [place]:   [facility] [phone] [place], [name] - [address] AT [facility] OF [address] [place] Phone: [phone] Fax: [phone]  [place] - [place] [address] [name] [address] Phone: [phone] Fax: [phone]  [facility] [name], [name] - [address] [name] [address] Phone: [phone] Fax: [phone]  [id number] - [place], [place] - [address] [place] [address] Phone: [phone] Fax: [phone]  [id number] - [place], [place] - [address] AT [facility] OF [address] & Jacob [place] [place] Phone: [phone] Fax: [phone]     Social Drivers of Health (SDOH) Social History: SDOH Screenings   Food Insecurity: No Food Insecurity (06/10/2024)  Housing: Low Risk  (06/10/2024)  Transportation Needs: No Transportation Needs (06/10/2024)  Utilities: Not At Risk (06/10/2024)  Social Connections: Unknown (06/10/2024)  Tobacco Use: High Risk (05/19/2024)   Received from [facility]   SDOH Interventions:     Readmission Risk Interventions     No data to display

## 2024-06-10 NOTE — Consult Note (Cosign Needed Addendum)
 [address]       [place] [address]             [phone]        Jacob Black [facility] Medical Record #[id number] Date of Birth: 1960-11-13  Referring: No ref. provider found Primary Care: [name], MD Primary Cardiologist:None  Chief Complaint:    Chief Complaint  Patient presents with   Code STEMI    History of Present Illness:     Jacob Black is a 63 year old male with a past medical history of hyperlipidemia, alcohol abuse (drinks about 3 liquor drinks per day), tobacco abuse (about 25 pack year history), gout (recent flare in right heel) and chronic hepatitis C. The patient developed central non radiating chest pain on 10/07 as well as nausea without vomiting, shortness of breath and palpitations. He denies diaphoresis, LOC, and cough. He called EMS who activated code STEMI and brought him to the ED, he received 324mg  ASA and 2 sublingual nitroglycerin tablets en route. EKG showed tombstone ST elevation in the inferior leads and he had ongoing chest discomfort upon arrival to the ED. He was emergently brought to the cath lab, he underwent successful percutaneous balloon angioplasty of his RCA with TIMI-3 flow established which was the culprit for his STEMI and IABP was placed. He was transferred to the ICU with resolution of his chest pain and ST elevation. 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 He has been started on IV Heparin and Cangrelor. Echocardiogram on 10/08 showed LVEF 50-55%, low normal left ventricle function, degenerative mitral valve without evidence of stenosis or regurgitation, and mild aortic valve regurgitation.   He lives with his wife in a home with about 5  steps per level. 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 He reports he was still going to work and walking many steps in the [facility] prior to this but did notice overall fatigue and shortness of breath with stairs that worsened about 6-68months ago. He also reports lock ups of chest pain that wrap around his ribs and chest for the last year or so. He currently denies chest pain. He also reports a family history of heart attacks in both parents. His dad died of an MI and stroke at 66 and his mom died of CHF at 45.   Current Activity/ Functional Status: Patient is independent with mobility/ambulation, transfers, ADL's, IADL's.   Zubrod Score: At the time of surgery this patient's most appropriate activity status/level should be described as: []     0    Normal activity, no symptoms [x]     1    Restricted in physical strenuous activity but ambulatory, able to do out light work []     2    Ambulatory and capable of self care, unable to do work activities, up and about                 more than 50%  Of the time                            []     3    Only limited self care, in bed greater than 50%  of waking hours []     4    Completely disabled, no self care, confined to bed or chair []     5    Moribund  Past Medical History:  Diagnosis Date   Gout    Hyperlipidemia     Past Surgical History:  Procedure Laterality Date   CORONARY/GRAFT ACUTE MI REVASCULARIZATION N/A 06/09/2024   Procedure: Coronary/Graft Acute MI Revascularization;  Surgeon: Ladona Heinz, MD;  Location: [facility] INVASIVE CV LAB;  Service: Cardiovascular;  Laterality: N/A;   FRACTURE SURGERY     LEG SURGERY      Social History   Tobacco Use  Smoking Status Every Day   Current packs/day: 0.50   Average packs/day: 0.5 packs/day for 49.8 years (24.9 ttl pk-yrs)   Types: Cigarettes   Start date: 09/03/1974  Smokeless Tobacco Never    Social History   Substance and Sexual Activity  Alcohol Use Yes   Comment:  occasionally     No Known Allergies  Current Facility-Administered Medications  Medication Dose Route Frequency Provider Last Rate Last Admin   0.9 %  sodium chloride  infusion  250 mL Intravenous PRN Ladona Heinz, MD   Stopped at 06/10/24 0631   acetaminophen  (TYLENOL ) tablet 650 mg  650 mg Oral Q4H PRN [name], Jay, MD   650 mg at 06/10/24 0635   [START ON 06/11/2024] aspirin chewable tablet 81 mg  81 mg Oral Daily [name], Jay, MD       cangrelor (KENGREAL) 50 mg in sodium chloride  0.9 % 250 mL (0.2 mg/mL) infusion  0.75 mcg/kg/min Intravenous Continuous Clegg, Amy D, NP 19.91 mL/hr at 06/10/24 1000 0.75 mcg/kg/min at 06/10/24 1000   folic acid (FOLVITE) tablet 1 mg  1 mg Oral Daily Marty Dorn HERO, MD   1 mg at 06/10/24 0849   heparin ADULT infusion 100 units/mL (25000 units/250mL)  1,000 Units/hr Intravenous Continuous Clair Lynwood CROME, RPH 10 mL/hr at 06/10/24 1000 1,000 Units/hr at 06/10/24 1000   LORazepam (ATIVAN) tablet 1-4 mg  1-4 mg Oral Q1H PRN Marty Dorn HERO, MD       Or   LORazepam (ATIVAN) injection 1-4 mg  1-4 mg Intravenous Q1H PRN Marty Dorn HERO, MD       morphine (PF) 2 MG/ML injection 1 mg  1 mg Intravenous Once Marty Dorn HERO, MD       multivitamin with minerals tablet 1 tablet  1 tablet Oral Daily Marty Dorn HERO, MD   1 tablet at 06/10/24 0844   nitroGLYCERIN 100 mcg/mL intra-arterial injection            ondansetron  (ZOFRAN ) injection 4 mg  4 mg Intravenous Q6H PRN Ladona Heinz, MD       rosuvastatin (CRESTOR) tablet 40 mg  40 mg Oral Daily Marty Dorn HERO, MD   40 mg at 06/10/24 0845   sodium chloride  flush (NS) 0.9 % injection 3 mL  3 mL Intravenous Q12H Ladona Heinz, MD   3 mL at 06/10/24 0853   sodium chloride  flush (NS) 0.9 % injection 3 mL  3 mL Intravenous PRN Ladona Heinz, MD       thiamine (VITAMIN B1) tablet 100 mg  100 mg Oral Daily Marty Dorn HERO, MD   100 mg at 06/10/24 9155   Or   thiamine (VITAMIN B1) injection 100 mg  100 mg Intravenous Daily  Marty Dorn HERO, MD        Medications Prior to Admission  Medication Sig Dispense Refill Last Dose/Taking  colchicine  0.6 MG tablet TAKE 1 TABLET BY MOUTH DAILY FOR TWO-FOUR WEEKS WHILE WE START THE ALLOPURINOL   Past Week   allopurinol (ZYLOPRIM) 100 MG tablet Take 100 mg by mouth daily.   More than a month   diclofenac  (VOLTAREN ) 50 MG EC tablet Take 1 tablet (50 mg total) by mouth 2 (two) times daily. 30 tablet 1 More than a month    Family History  Problem Relation Age of Onset   Diabetes Mother    Heart disease Mother    Diabetes Father    Heart disease Father    Stroke Mother 14   Heart attack Father      Review of Systems:   Review of Systems  Constitutional:  Positive for malaise/fatigue. Negative for chills, diaphoresis, fever and weight loss.  HENT:  Negative for hearing loss.   Eyes:  Negative for blurred vision.  Respiratory:  Positive for shortness of breath. Negative for cough, sputum production and wheezing.   Cardiovascular:  Positive for chest pain and palpitations. Negative for orthopnea and leg swelling.  Gastrointestinal:  Positive for blood in stool, diarrhea and nausea. Negative for abdominal pain, constipation, heartburn, melena and vomiting.       Reports he fill sup the toilet with bright red blood after bowel movements for the last year  Genitourinary:  Negative for dysuria.  Musculoskeletal:  Negative for falls and myalgias.  Neurological:  Negative for dizziness, seizures, loss of consciousness and weakness.       Was able to answer questions when EMS arrived but wife states he was very out of it, does not believe he ever fully lost consciousness  Endo/Heme/Allergies:  Does not bruise/bleed easily.  Psychiatric/Behavioral:  Negative for depression. The patient is not nervous/anxious.   Dental: Went to the dentist recently and was told he had an infection around a previous root canal on right side, no treatment was done    Physical Exam: BP (!)  130/98   Pulse 92   Temp 97.9 F (36.6 C) (Oral)   Resp 16   Wt 91.9 kg   SpO2 96%   BMI 30.36 kg/m   General appearance: alert, cooperative, and no distress Head: Normocephalic, without obvious abnormality, atraumatic Neck: no adenopathy, no carotid bruit, no JVD, supple, symmetrical, trachea midline, and thyroid not enlarged, symmetric, no tenderness/mass/nodules Lymph nodes: Cervical, supraclavicular, and axillary nodes normal. Resp: clear to auscultation bilaterally Cardio: regular rate and rhythm, no rubs or murmurs GI: soft, non-tender; bowel sounds normal; no masses,  no organomegaly Extremities: extremities normal, atraumatic, no cyanosis or edema. 2+ DP/PT pulses bilaterally, 2+ left radial pulse, right radial not examined due to recent cath Neurologic: Grossly normal Dental: Dental carries present, no large/obvious infection seen  Diagnostic Studies & Recent Radiology Findings:   DG CHEST PORT 1 VIEW Result Date: 06/10/2024 CLINICAL DATA:  Intra-aortic balloon pump. EXAM: PORTABLE CHEST 1 VIEW COMPARISON:  None Available. FINDINGS: The heart size and mediastinal contours are within normal limits. Both lungs are clear. The visualized skeletal structures are unremarkable. IMPRESSION: No active disease. Electronically Signed   By: Norleen DELENA Kil M.D.   On: 06/10/2024 06:36   CARDIAC CATHETERIZATION Result Date: 06/10/2024 Images from the original result were not included. Cardiac Catheterization 06/10/24: Hemodynamic data: LV 148/10, EDP 26 mmHg.  Ao 143/90, mean 114 mmHg.  No pressure gradient across aortic valve. Angiographic data: LV: LVEF 45 to 50% with inferior hypokinesis to akinesis. Left Main: Distal left main 40% stenosis, hazy. LAD:  Proximal/ostial LAD has a complex 99% stenosis.  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  D1 is very large with a ostial 80% stenosis.  Mid to distal LAD has a 40% lesion.  LAD gives collaterals to distal RCA. LCx: Gives origin  to high OM1.  Ostial Cx has a 99% stenosis and OM1 with ostial 99% stenosis and a proximal 80% stenosis.  Cx continues as a large OM 2 and is disease-free. RCA: Very large caliber vessel.  Mid segment has a ulcerated and mildly thrombotic 99% stenosis with TIMI I-II flow with a large PL branch and a large PDA. Intervention data: Successful balloon angioplasty with establishing TIMI-3 flow into the mid RCA with a 3.0 x 15 mm emerge at 6 atmospheric pressure.  TIMI-3 flow was established with complete resolution of chest pain and ST elevations.  Intra-aortic balloon pump placed, patient started on heparin and cangrelor, needs CABG. Impression and recommendations: Patient to be started on IV heparin tonight, intra-aortic balloon pump support, left message to Dr. Redell Fellers, patient is stable from cardiac standpoint presently.  Will keep him n.p.o.     I have independently reviewed the above radiologic studies and discussed with the patient   Recent Lab Findings: Lab Results  Component Value Date   WBC 8.6 06/10/2024   HGB 13.3 06/10/2024   HCT 39.3 06/10/2024   PLT 214 06/10/2024   GLUCOSE 133 (H) 06/10/2024   CHOL 202 (H) 06/10/2024   TRIG 254 (H) 06/10/2024   HDL 40 (L) 06/10/2024   LDLCALC 111 (H) 06/10/2024   ALT 21 11/21/2016   AST 30 11/21/2016   NA 137 06/10/2024   K 3.7 06/10/2024   CL 106 06/10/2024   CREATININE 1.18 06/10/2024   BUN 18 06/10/2024   CO2 17 (L) 06/10/2024   TSH 2.260 06/10/2024   INR 1.01 07/05/2015   HGBA1C 5.2 06/10/2024      Assessment / Plan:   Severe multivessel CAD with STEMI: Underwent balloon angioplasty of culprit lesion RCA. IABP in place. Patient no longer has chest pain. As discussed with AHF team plan to d/c IABP and ambulate today. Patient has severe multivessel disease and would benefit from CABG surgery, seems to be a reasonable candidate. On IV heparin and Cangrelor, cardiology plans to continue until call to OR. Dr. Fellers to ultimately  determine candidacy and timing of surgery but will tentatively plan for CABG Saturday 10/11.    HLD: On Rosuvastatin Gout: On Allopurinol and Colchicine  PRN PTA, recent gout flare in right heel Alcohol abuse: Drinks about 3 liquor drinks per day, recommend cessation. CIWA protocol Tobacco abuse: Smokes 0.5-0.75 ppd for 50 years, about 25 pack year history. Recommend cessation. Use nicotine patches PRN Chronic hepatitis C  I  spent 30 minutes counseling the patient face to face.  Con GORMAN Bend, PA-C 06/10/2024 10:15 AM   Chart reviewed, patient examined, agree with above.  He presented with inferior STEMI due to 99% ulcerated and thrombotic appearing lesion in the large mid RCA that was treated with angioplasty reestablishing TIMI-3 flow with resolution of his chest pain and ST elevation. He also has tight lesions in the LAD/diagonal and LCX. IABP was inserted. He has done well since on heparin and cangrelor and IABP removed. I agree with need for CABG. Echo shows EF 50-55% with no valvular abnormality. Due to full schedule we will plan to do Saturday morning. I would continue heparin and cangrelor prior to surgery. I discussed the operative procedure  with the patient including alternatives, benefits and risks; including but not limited to bleeding, blood transfusion, infection, stroke, myocardial infarction, graft failure, heart block requiring a permanent pacemaker, organ dysfunction, and death.  Ozell JONELLE Barge understands and agrees to proceed.   Dorise LOIS Fellers, MD 06/10/2024

## 2024-06-10 NOTE — ED Provider Notes (Signed)
 [facility]H CARDIOVASCULAR ICU Provider Note   CSN: [id number] Arrival date & time: 06/09/24  2324     Patient presents with: Code STEMI   Jacob Black is a 63 y.o. male.   63 year old male that presents ER today secondary to chest pain.  Patient states he had limited chest pain earlier in the day but it resolved on its own.  Approximately 25 minutes prior to calling EMS the chest pain returned and did not improve.  Called EMS who is EKG showed consistent with ST elevation myocardial infarction in inferior leads.  Was given aspirin and nitroglycerin which helped relieve his pain a little bit but still having chest pain on arrival here.  Limited associated dyspnea and generalized weakness.  No nausea or lightheadedness.  Patient with no cardiac history.  No recent illnesses.        Prior to Admission medications   Medication Sig Start Date End Date Taking? Authorizing Provider  colchicine  0.6 MG tablet TAKE 1 TABLET BY MOUTH DAILY FOR TWO-FOUR WEEKS WHILE WE START THE ALLOPURINOL   Yes [provider]  allopurinol (ZYLOPRIM) 100 MG tablet Take 100 mg by mouth daily.    [provider]  diclofenac  (VOLTAREN ) 50 MG EC tablet Take 1 tablet (50 mg total) by mouth 2 (two) times daily. 03/02/24   Reddick, Johnathan B, NP  albuterol  (PROVENTIL  HFA;VENTOLIN  HFA) 108 (90 Base) MCG/ACT inhaler Inhale 2 puffs into the lungs every 6 (six) hours as needed for wheezing or shortness of breath. 11/21/16 05/06/20  Fairy Frames, MD  febuxostat  (ULORIC ) 40 MG tablet Take 40 mg by mouth daily.  05/06/20  [provider]    Allergies: Patient has no known allergies.    Review of Systems  Updated Vital Signs BP (!) 130/98   Pulse (!) 198   Temp 98 F (36.7 C) (Oral)   Resp 17   Wt 91.9 kg   SpO2 94%   BMI 30.36 kg/m   Physical Exam Vitals and nursing note reviewed.  Constitutional:      Appearance: He is well-developed. He is ill-appearing.  HENT:     Head:  Normocephalic and atraumatic.  Cardiovascular:     Rate and Rhythm: Normal rate.     Comments: Strong radial and DP pulses Pulmonary:     Effort: Pulmonary effort is normal. No respiratory distress.     Breath sounds: No wheezing or rales.  Abdominal:     General: There is no distension.  Musculoskeletal:        General: Normal range of motion.     Cervical back: Normal range of motion.  Neurological:     Mental Status: He is alert.     (all labs ordered are listed, but only abnormal results are displayed) Labs Reviewed  BASIC METABOLIC PANEL WITH GFR - Abnormal; Notable for the following components:      Result Value   CO2 17 (*)    Glucose, Bld 133 (*)    All other components within normal limits  CBC - Abnormal; Notable for the following components:   RBC 4.16 (*)    All other components within normal limits  LIPID PANEL - Abnormal; Notable for the following components:   Cholesterol 202 (*)    Triglycerides 254 (*)    HDL 40 (*)    VLDL 51 (*)    LDL Cholesterol 111 (*)    All other components within normal limits  I-STAT CHEM 8, ED - Abnormal;  Notable for the following components:   Creatinine, Ser 1.60 (*)    Glucose, Bld 119 (*)    Calcium, Ion 1.10 (*)    All other components within normal limits  POCT I-STAT, CHEM 8 - Abnormal; Notable for the following components:   Glucose, Bld 139 (*)    TCO2 20 (*)    Hemoglobin 12.9 (*)    HCT 38.0 (*)    All other components within normal limits  CG4 I-STAT (LACTIC ACID) - Abnormal; Notable for the following components:   Lactic Acid, Venous 2.7 (*)    All other components within normal limits  MRSA NEXT GEN BY PCR, NASAL  TSH  LIPOPROTEIN A (LPA)  POCT ACTIVATED CLOTTING TIME    EKG: EKG Interpretation Date/Time:  Tuesday June 09 2024 23:31:59 EDT Ventricular Rate:  62 PR Interval:  246 QRS Duration:  97 QT Interval:  397 QTC Calculation: 404 R Axis:   62  Text Interpretation: Sinus rhythm Prolonged  PR interval Inferior infarct, acute (RCA) Probable RV involvement, suggest recording right precordial leads >>> Acute MI <<< Confirmed by Lorette Mayo [phone]) on 06/10/2024 4:32:16 AM  Radiology: CARDIAC CATHETERIZATION Result Date: 06/10/2024 Images from the original result were not included. Cardiac Catheterization 06/10/24: Hemodynamic data: LV 148/10, EDP 26 mmHg.  Ao 143/90, mean 114 mmHg.  No pressure gradient across aortic valve. Angiographic data: LV: LVEF 45 to 50% with inferior hypokinesis to akinesis. Left Main: Distal left main 40% stenosis, hazy. LAD: Proximal/ostial LAD has a complex 99% stenosis.  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  D1 is very large with a ostial 80% stenosis.  Mid to distal LAD has a 40% lesion.  LAD gives collaterals to distal RCA. LCx: Gives origin to high OM1.  Ostial Cx has a 99% stenosis and OM1 with ostial 99% stenosis and a proximal 80% stenosis.  Cx continues as a large OM 2 and is disease-free. RCA: Very large caliber vessel.  Mid segment has a ulcerated and mildly thrombotic 99% stenosis with TIMI I-II flow with a large PL branch and a large PDA. Intervention data: Successful balloon angioplasty with establishing TIMI-3 flow into the mid RCA with a 3.0 x 15 mm emerge at 6 atmospheric pressure.  TIMI-3 flow was established with complete resolution of chest pain and ST elevations.  Intra-aortic balloon pump placed, patient started on heparin and cangrelor, needs CABG. Impression and recommendations: Patient to be started on IV heparin tonight, intra-aortic balloon pump support, left message to Dr. Redell Fellers, patient is stable from cardiac standpoint presently.  Will keep him n.p.o.     .Critical Care  Performed by: Lorette Mayo, MD Authorized by: Lorette Mayo, MD   Critical care provider statement:    Critical care time (minutes):  30   Critical care was necessary to treat or prevent imminent or life-threatening deterioration of the  following conditions:  Cardiac failure   Critical care was time spent personally by me on the following activities:  Development of treatment plan with patient or surrogate, discussions with consultants, evaluation of patient's response to treatment, examination of patient, ordering and review of laboratory studies, ordering and review of radiographic studies, ordering and performing treatments and interventions, pulse oximetry, re-evaluation of patient's condition and review of old charts    Medications Ordered in the ED  morphine (PF) 2 MG/ML injection 1 mg ( Intravenous MAR Unhold 06/10/24 0108)  nitroGLYCERIN 100 mcg/mL intra-arterial injection (has no administration in time range)  labetalol (  NORMODYNE) injection 10 mg (has no administration in time range)  hydrALAZINE (APRESOLINE) injection 5 mg (has no administration in time range)  acetaminophen  (TYLENOL ) tablet 650 mg (650 mg Oral Given 06/10/24 0227)  ondansetron  (ZOFRAN ) injection 4 mg (has no administration in time range)  sodium chloride  flush (NS) 0.9 % injection 3 mL (3 mLs Intravenous Given 06/10/24 0249)  sodium chloride  flush (NS) 0.9 % injection 3 mL (has no administration in time range)  0.9 %  sodium chloride  infusion ( Intravenous Infusion Verify 06/10/24 0400)  aspirin chewable tablet 81 mg (has no administration in time range)  cangrelor (KENGREAL) 50 mg in sodium chloride  0.9 % 250 mL (0.2 mg/mL) infusion (4 mcg/kg/min  88.5 kg Intravenous New Bag/Given 06/10/24 0414)  rosuvastatin (CRESTOR) tablet 40 mg (has no administration in time range)  LORazepam (ATIVAN) tablet 1-4 mg (has no administration in time range)    Or  LORazepam (ATIVAN) injection 1-4 mg (has no administration in time range)  thiamine (VITAMIN B1) tablet 100 mg (has no administration in time range)    Or  thiamine (VITAMIN B1) injection 100 mg (has no administration in time range)  folic acid (FOLVITE) tablet 1 mg (has no administration in time range)   multivitamin with minerals tablet 1 tablet (has no administration in time range)  heparin injection 4,000 Units (4,000 Units Intravenous Given 06/09/24 2336)  0.9 %  sodium chloride  infusion (250 mLs Intravenous New Bag/Given 06/10/24 0012)  cangrelor (KENGREAL) 50,000 mcg in sodium chloride  0.9 % 250 mL (200 mcg/mL) infusion (0.75 mcg/kg/min  88.5 kg Intravenous New Bag/Given 06/10/24 0050)  rosuvastatin (CRESTOR) tablet 40 mg (40 mg Oral Given 06/10/24 0159)                                    Medical Decision Making Risk Prescription drug management. Decision regarding hospitalization.   ECG here consistent with RCA occlusion.  Cardiology at bedside.  Patient given heparin.  Has already had aspirin.  Plan for catheterization.  Low suspicion for any other complicating factors.  Patient to Cath Lab.  Final diagnoses:  Acute ST elevation myocardial infarction (STEMI) involving right coronary artery [facility])    ED Discharge Orders     None          Kasara Schomer, Selinda, MD 06/10/24 [phone]

## 2024-06-11 ENCOUNTER — Inpatient Hospital Stay (HOSPITAL_COMMUNITY)

## 2024-06-11 DIAGNOSIS — I2119 ST elevation (STEMI) myocardial infarction involving other coronary artery of inferior wall: Secondary | ICD-10-CM | POA: Diagnosis not present

## 2024-06-11 DIAGNOSIS — Z01810 Encounter for preprocedural cardiovascular examination: Secondary | ICD-10-CM | POA: Diagnosis not present

## 2024-06-11 LAB — HEPARIN LEVEL (UNFRACTIONATED)
Heparin Unfractionated: <0.1 [IU]/mL — ABNORMAL LOW (ref 0.30–0.70)
Heparin Unfractionated: 0.19 [IU]/mL — ABNORMAL LOW (ref 0.30–0.70)
Heparin Unfractionated: 0.34 [IU]/mL (ref 0.30–0.70)

## 2024-06-11 LAB — CBC
HCT: 37.3 % — ABNORMAL LOW (ref 39.0–52.0)
Hemoglobin: 12.8 g/dL — ABNORMAL LOW (ref 13.0–17.0)
MCH: 32.5 pg (ref 26.0–34.0)
MCHC: 34.3 g/dL (ref 30.0–36.0)
MCV: 94.7 fL (ref 80.0–100.0)
Platelets: 201 K/uL (ref 150–400)
RBC: 3.94 MIL/uL — ABNORMAL LOW (ref 4.22–5.81)
RDW: 12.6 % (ref 11.5–15.5)
WBC: 9.7 K/uL (ref 4.0–10.5)
nRBC: 0 % (ref 0.0–0.2)

## 2024-06-11 LAB — MAGNESIUM: Magnesium: 2.1 mg/dL (ref 1.7–2.4)

## 2024-06-11 LAB — BASIC METABOLIC PANEL WITH GFR
Anion gap: 10 (ref 5–15)
BUN: 14 mg/dL (ref 6–20)
CO2: 26 mmol/L (ref 22–32)
Calcium: 9.3 mg/dL (ref 8.9–10.3)
Chloride: 101 mmol/L (ref 98–111)
Creatinine, Ser: 1.42 mg/dL — ABNORMAL HIGH (ref 0.61–1.24)
GFR, Estimated: 57 mL/min — ABNORMAL LOW (ref >60)
Glucose, Bld: 107 mg/dL — ABNORMAL HIGH (ref 70–99)
Potassium: 4 mmol/L (ref 3.5–5.1)
Sodium: 137 mmol/L (ref 135–145)

## 2024-06-11 LAB — LIPOPROTEIN A (LPA): Lipoprotein (a): 112.5 nmol/L — ABNORMAL HIGH (ref <75.0)

## 2024-06-11 MED ORDER — ISOSORBIDE MONONITRATE ER 30 MG PO TB24
30.0000 mg | ORAL_TABLET | Freq: Every day | ORAL | Status: AC
  Administered 2024-06-11 – 2024-06-12 (×2): 30 mg via ORAL
  Filled 2024-06-11 (×2): qty 1

## 2024-06-11 MED ORDER — SENNA 8.6 MG PO TABS
1.0000 | ORAL_TABLET | Freq: Two times a day (BID) | ORAL | Status: DC
  Administered 2024-06-12: 8.6 mg via ORAL
  Filled 2024-06-11 (×2): qty 1

## 2024-06-11 MED ORDER — POLYETHYLENE GLYCOL 3350 17 G PO PACK
17.0000 g | PACK | Freq: Two times a day (BID) | ORAL | Status: DC
  Administered 2024-06-12: 17 g via ORAL
  Filled 2024-06-11 (×2): qty 1

## 2024-06-11 NOTE — Progress Notes (Signed)
 PHARMACY - ANTICOAGULATION CONSULT NOTE  Pharmacy Consult for Heparin  Indication: Multi-vessel CAD, s/p cath, awaiting CVTS consult, IABP in place  No Known Allergies  Patient Measurements: Weight: 91.9 kg (202 lb 9.6 oz)  Vital Signs: Temp: 98.1 F (36.7 C) (10/09 1941) Temp Source: Oral (10/09 1941) BP: 131/77 (10/09 1941) Pulse Rate: 65 (10/09 1941)  Labs: Recent Labs    06/10/24 0003 06/10/24 0331 06/11/24 0236 06/11/24 1323 06/11/24 2110  HGB 12.9* 13.3 12.8*  --   --   HCT 38.0* 39.3 37.3*  --   --   PLT  --  214 201  --   --   HEPARINUNFRC  --   --  <0.10* 0.19* 0.34  CREATININE 1.20 1.18  --  1.42*  --     Estimated Creatinine Clearance: 61.4 mL/min (A) (by C-G formula based on SCr of 1.42 mg/dL (H)).   Medical History: Past Medical History:  Diagnosis Date   Gout    Hyperlipidemia     Assessment: 63 y/o M presented 10/7 as CODE STEMI, s/p LHC revealing multi-vessel CAD with successful balloon angioplasty to mid RCA.  IABP placed, started on heparin and cangrelor, and TCTS consulted for CABG workup.    TCTS tentatively planning CABG on Saturday 10/11.  06/11/24: Heparin level 0.19, subtherapeutic at heparin 1200 units/hr. No issues with infusion running or signs of bleeding per RN. CBC stable (Hgb 12.8, PLT 201).   Goal of Therapy:  Heparin level 0.3-0.5 units/ml Monitor platelets by anticoagulation protocol: Yes   Plan:  Continue cangrelor IV 0.75 mcg/kg/min Increase heparin IV 1400 units/hr Heparin level in 6 hours Daily CBC/heparin level Monitor for bleeding  Morna Breach, PharmD PGY2 Cardiology Pharmacy Resident 06/11/2024 9:52 PM  ______________________________________  10/9 PM update - Heparin level 0.34 therapeutic on 1400 units/hr.  No issues or bleeding per RN.   Plan - continue heparin IV 1400 units/hr, ~6h confirmatory heparin with AM labs.   Maurilio Fila, PharmD Clinical Pharmacist 06/11/2024  9:54 PM

## 2024-06-11 NOTE — Progress Notes (Signed)
 Pre-CABG doppler has been completed.  Results can be found in chart review under CV Proc.  06/11/2024 5:41 PM  Socorro Ebron Elden Appl, RVT.

## 2024-06-11 NOTE — Plan of Care (Signed)

## 2024-06-11 NOTE — Progress Notes (Signed)
 PHARMACY - ANTICOAGULATION CONSULT NOTE  Pharmacy Consult for Heparin  Indication: Multi-vessel CAD, s/p cath, awaiting CVTS consult, IABP in place  No Known Allergies  Patient Measurements: Weight: 91.9 kg (202 lb 9.6 oz)  Vital Signs: Temp: 97.9 F (36.6 C) (10/09 1135) Temp Source: Oral (10/09 1135) BP: 140/69 (10/09 1300) Pulse Rate: 71 (10/09 1300)  Labs: Recent Labs    06/10/24 0003 06/10/24 0331 06/11/24 0236 06/11/24 1323  HGB 12.9* 13.3 12.8*  --   HCT 38.0* 39.3 37.3*  --   PLT  --  214 201  --   HEPARINUNFRC  --   --  <0.10* 0.19*  CREATININE 1.20 1.18  --  1.42*    Estimated Creatinine Clearance: 61.4 mL/min (A) (by C-G formula based on SCr of 1.42 mg/dL (H)).   Medical History: Past Medical History:  Diagnosis Date   Gout    Hyperlipidemia     Assessment: 63 y/o M presented 10/7 as CODE STEMI, s/p LHC revealing multi-vessel CAD with successful balloon angioplasty to mid RCA.  IABP placed, started on heparin and cangrelor, and TCTS consulted for CABG workup.    TCTS tentatively planning CABG on Saturday 10/11.  06/11/24: Heparin level 0.19, subtherapeutic at heparin 1200 units/hr. No issues with infusion running or signs of bleeding per RN. CBC stable (Hgb 12.8, PLT 201).   Goal of Therapy:  Heparin level 0.3-0.5 units/ml Monitor platelets by anticoagulation protocol: Yes   Plan:  Continue cangrelor IV 0.75 mcg/kg/min Increase heparin IV 1400 units/hr Heparin level in 6 hours Daily CBC/heparin level Monitor for bleeding  Morna Breach, PharmD PGY2 Cardiology Pharmacy Resident 06/11/2024 2:52 PM

## 2024-06-11 NOTE — Progress Notes (Signed)
 PHARMACY - ANTICOAGULATION CONSULT NOTE  Pharmacy Consult for heparin Indication: CAD awaiting possible CABG  Labs: Recent Labs    06/09/24 2338 06/10/24 0003 06/10/24 0331 06/11/24 0236  HGB 14.6 12.9* 13.3 12.8*  HCT 43.0 38.0* 39.3 37.3*  PLT  --   --  214 201  HEPARINUNFRC  --   --   --  <0.10*  CREATININE 1.60* 1.20 1.18  --    Assessment: 62yo male subtherapeutic on heparin after resumed s/p IABP removal; no infusion issues or signs of bleeding per RN.  Goal of Therapy:  Heparin level 0.3-0.5 units/ml   Plan:  Increase heparin infusion cautiously by 2 units/kg/hr to 1200 units/hr. Check level in 6 hours.   Marvetta Dauphin, PharmD, BCPS 06/11/2024 3:59 AM

## 2024-06-11 NOTE — Progress Notes (Signed)
 Advanced Heart Failure Rounding Note  Cardiologist: Gordy Bergamo, MD  Chief Complaint: STEMI Subjective:    IABP removed 06/10/24  Plan for CABG 10/11.   Feels ok, family at bedside. Some SOB with activity, resolves with rest.   Objective:   Weight Range: 91.9 kg Body mass index is 30.36 kg/m.   Vital Signs:   Temp:  [96.9 F (36.1 C)-98.5 F (36.9 C)] 96.9 F (36.1 C) (10/09 0800) Pulse Rate:  [55-88] 88 (10/09 0929) Resp:  [8-30] 14 (10/09 0900) BP: (83-149)/(50-97) 144/88 (10/09 0929) SpO2:  [88 %-98 %] 97 % (10/09 0900) Last BM Date :  (PTA)  Weight change: Filed Weights   06/09/24 2329 06/10/24 0115  Weight: 88.5 kg 91.9 kg    Intake/Output:   Intake/Output Summary (Last 24 hours) at 06/11/2024 1010 Last data filed at 06/11/2024 0900 Gross per 24 hour  Intake 470.31 ml  Output 725 ml  Net -254.69 ml      Physical Exam  General:  well appearing.  No respiratory difficulty Neck: JVD ~7 cm.  Cor: Regular rate & rhythm. No murmurs. Lungs: clear Extremities: no edema. R groin site stable.  Neuro: alert & oriented x 3. Affect pleasant.   Telemetry   SB 50s (Personally reviewed)    Labs    CBC Recent Labs    06/10/24 0331 06/11/24 0236  WBC 8.6 9.7  HGB 13.3 12.8*  HCT 39.3 37.3*  MCV 94.5 94.7  PLT 214 201   Basic Metabolic Panel Recent Labs    89/91/74 0003 06/10/24 0331  NA 140 137  K 3.5 3.7  CL 106 106  CO2  --  17*  GLUCOSE 139* 133*  BUN 17 18  CREATININE 1.20 1.18  CALCIUM  --  9.0   Liver Function Tests No results for input(s): AST, ALT, ALKPHOS, BILITOT, PROT, ALBUMIN in the last 72 hours. No results for input(s): LIPASE, AMYLASE in the last 72 hours. Cardiac Enzymes No results for input(s): CKTOTAL, CKMB, CKMBINDEX, TROPONINI in the last 72 hours.  BNP: BNP (last 3 results) No results for input(s): BNP in the last 8760 hours.  ProBNP (last 3 results) No results for input(s): PROBNP in  the last 8760 hours.   D-Dimer No results for input(s): DDIMER in the last 72 hours. Hemoglobin A1C Recent Labs    06/10/24 0331  HGBA1C 5.2   Fasting Lipid Panel Recent Labs    06/10/24 0331  CHOL 202*  HDL 40*  LDLCALC 111*  TRIG 254*  CHOLHDL 5.1   Thyroid Function Tests Recent Labs    06/10/24 0331  TSH 2.260    Other results:   Imaging    No results found.   Medications:     Scheduled Medications:  aspirin  81 mg Oral Daily   Chlorhexidine Gluconate Cloth  6 each Topical Daily   folic acid  1 mg Oral Daily   isosorbide mononitrate  30 mg Oral Daily   metoprolol succinate  25 mg Oral Daily    morphine injection  1 mg Intravenous Once   multivitamin with minerals  1 tablet Oral Daily   rosuvastatin  40 mg Oral Daily   sodium chloride  flush  3 mL Intravenous Q12H   thiamine  100 mg Oral Daily   Or   thiamine  100 mg Intravenous Daily    Infusions:  cangrelor (KENGREAL) 50 mg in sodium chloride  0.9 % 250 mL (0.2 mg/mL) infusion 0.75 mcg/kg/min (06/11/24 0900)  heparin 1,200 Units/hr (06/11/24 0900)    PRN Medications: acetaminophen , LORazepam **OR** LORazepam, ondansetron  (ZOFRAN ) IV, sodium chloride  flush    Patient Profile   Jacob Black is a 63 y.o. male with hx ETOH abuse, hypercholesteremia and gout. Now admitted with STEMI.   Assessment/Plan  STEMI Cath with severe multivessel disease. S/p Ballon Angioplasty to RCA IABP removed 06/10/24. Plan to keep Cangrelor until surgery. Continue ASA. Continue hep gtt.  CT Surgery consulted with plan for CABG 10/11 Denies CP   HTN  - Elevated.  - Stop hydral/Isordil - Start Imdur 30 mg daily   Tobacco Abuse Discussed cessation.   ETOH abuse - Monitor for CIWA needs, does not appear to need at this time.  - cessation advised.   Ambulate. Plan for floor tx today. AHF team will continue to follow.   Length of Stay: 1  Beckey LITTIE Coe, NP  06/11/2024, 10:10 AM  Advanced Heart  Failure Team Pager [phone] (M-F; 7a - 5p)  Please contact [facility] for night-coverage after hours (5p -7a ) and weekends on [email or web address]

## 2024-06-12 DIAGNOSIS — I2119 ST elevation (STEMI) myocardial infarction involving other coronary artery of inferior wall: Secondary | ICD-10-CM | POA: Diagnosis not present

## 2024-06-12 LAB — MAGNESIUM: Magnesium: 2 mg/dL (ref 1.7–2.4)

## 2024-06-12 LAB — CBC
HCT: 38.8 % — ABNORMAL LOW (ref 39.0–52.0)
Hemoglobin: 13.3 g/dL (ref 13.0–17.0)
MCH: 32.4 pg (ref 26.0–34.0)
MCHC: 34.3 g/dL (ref 30.0–36.0)
MCV: 94.4 fL (ref 80.0–100.0)
Platelets: 187 K/uL (ref 150–400)
RBC: 4.11 MIL/uL — ABNORMAL LOW (ref 4.22–5.81)
RDW: 12.7 % (ref 11.5–15.5)
WBC: 8.8 K/uL (ref 4.0–10.5)
nRBC: 0 % (ref 0.0–0.2)

## 2024-06-12 LAB — ABO/RH: ABO/RH(D): A POS

## 2024-06-12 LAB — BASIC METABOLIC PANEL WITH GFR
Anion gap: 12 (ref 5–15)
BUN: 17 mg/dL (ref 6–20)
CO2: 19 mmol/L — ABNORMAL LOW (ref 22–32)
Calcium: 8.9 mg/dL (ref 8.9–10.3)
Chloride: 102 mmol/L (ref 98–111)
Creatinine, Ser: 1.12 mg/dL (ref 0.61–1.24)
GFR, Estimated: >60 mL/min (ref >60)
Glucose, Bld: 105 mg/dL — ABNORMAL HIGH (ref 70–99)
Potassium: 3.9 mmol/L (ref 3.5–5.1)
Sodium: 133 mmol/L — ABNORMAL LOW (ref 135–145)

## 2024-06-12 LAB — TYPE AND SCREEN
ABO/RH(D): A POS
Antibody Screen: NEGATIVE

## 2024-06-12 LAB — HEPARIN LEVEL (UNFRACTIONATED): Heparin Unfractionated: 0.3 [IU]/mL (ref 0.30–0.70)

## 2024-06-12 MED ORDER — NOREPINEPHRINE 4 MG/250ML-% IV SOLN
0.0000 ug/min | INTRAVENOUS | Status: DC
  Filled 2024-06-12: qty 250

## 2024-06-12 MED ORDER — CHLORHEXIDINE GLUCONATE 0.12 % MT SOLN
15.0000 mL | Freq: Once | OROMUCOSAL | Status: AC
  Administered 2024-06-13: 15 mL via OROMUCOSAL
  Filled 2024-06-12: qty 15

## 2024-06-12 MED ORDER — MAGNESIUM SULFATE 50 % IJ SOLN
40.0000 meq | INTRAMUSCULAR | Status: DC
  Filled 2024-06-12: qty 9.85

## 2024-06-12 MED ORDER — TRANEXAMIC ACID (OHS) PUMP PRIME SOLUTION
2.0000 mg/kg | INTRAVENOUS | Status: DC
  Filled 2024-06-12: qty 1.84

## 2024-06-12 MED ORDER — MILRINONE LACTATE IN DEXTROSE 20-5 MG/100ML-% IV SOLN
0.3000 ug/kg/min | INTRAVENOUS | Status: DC
  Filled 2024-06-12: qty 100

## 2024-06-12 MED ORDER — CHLORHEXIDINE GLUCONATE CLOTH 2 % EX PADS
6.0000 | MEDICATED_PAD | Freq: Once | CUTANEOUS | Status: AC
  Administered 2024-06-13: 6 via TOPICAL

## 2024-06-12 MED ORDER — POTASSIUM CHLORIDE 2 MEQ/ML IV SOLN
80.0000 meq | INTRAVENOUS | Status: DC
  Filled 2024-06-12: qty 40

## 2024-06-12 MED ORDER — BISACODYL 5 MG PO TBEC
5.0000 mg | DELAYED_RELEASE_TABLET | Freq: Once | ORAL | Status: DC
  Filled 2024-06-12: qty 1

## 2024-06-12 MED ORDER — CEFAZOLIN SODIUM-DEXTROSE 2-4 GM/100ML-% IV SOLN
2.0000 g | INTRAVENOUS | Status: AC
  Administered 2024-06-13: 2 g via INTRAVENOUS
  Filled 2024-06-12: qty 100

## 2024-06-12 MED ORDER — TRANEXAMIC ACID (OHS) BOLUS VIA INFUSION
15.0000 mg/kg | INTRAVENOUS | Status: AC
  Administered 2024-06-13: 1378.5 mg via INTRAVENOUS
  Filled 2024-06-12: qty 1379

## 2024-06-12 MED ORDER — HEPARIN 30,000 UNITS/1000 ML (OHS) CELLSAVER SOLUTION
Status: DC
  Filled 2024-06-12: qty 1000

## 2024-06-12 MED ORDER — TEMAZEPAM 15 MG PO CAPS
15.0000 mg | ORAL_CAPSULE | Freq: Once | ORAL | Status: DC | PRN
Start: 2024-06-12 — End: 2024-06-13

## 2024-06-12 MED ORDER — METOPROLOL TARTRATE 12.5 MG HALF TABLET
12.5000 mg | ORAL_TABLET | Freq: Once | ORAL | Status: DC
  Filled 2024-06-12: qty 1

## 2024-06-12 MED ORDER — DEXMEDETOMIDINE HCL IN NACL 400 MCG/100ML IV SOLN
0.1000 ug/kg/h | INTRAVENOUS | Status: AC
Start: 2024-06-13 — End: 2024-06-13
  Administered 2024-06-13: .5 ug/kg/h via INTRAVENOUS
  Filled 2024-06-12: qty 100

## 2024-06-12 MED ORDER — NITROGLYCERIN IN D5W 200-5 MCG/ML-% IV SOLN
2.0000 ug/min | INTRAVENOUS | Status: DC
  Filled 2024-06-12: qty 250

## 2024-06-12 MED ORDER — CHLORHEXIDINE GLUCONATE CLOTH 2 % EX PADS
6.0000 | MEDICATED_PAD | Freq: Once | CUTANEOUS | Status: AC
  Administered 2024-06-12: 6 via TOPICAL

## 2024-06-12 MED ORDER — INSULIN REGULAR(HUMAN) IN NACL 100-0.9 UT/100ML-% IV SOLN
INTRAVENOUS | Status: AC
  Administered 2024-06-13: 1.1 [IU]/h via INTRAVENOUS
  Filled 2024-06-12: qty 100

## 2024-06-12 MED ORDER — PHENYLEPHRINE HCL-NACL 20-0.9 MG/250ML-% IV SOLN
30.0000 ug/min | INTRAVENOUS | Status: AC
  Administered 2024-06-13: 10 ug/min via INTRAVENOUS
  Filled 2024-06-12: qty 250

## 2024-06-12 MED ORDER — PLASMA-LYTE A IV SOLN
INTRAVENOUS | Status: DC
  Filled 2024-06-12: qty 2.5

## 2024-06-12 MED ORDER — DIAZEPAM 5 MG PO TABS
5.0000 mg | ORAL_TABLET | Freq: Once | ORAL | Status: AC
  Administered 2024-06-13: 5 mg via ORAL
  Filled 2024-06-12: qty 1

## 2024-06-12 MED ORDER — EPINEPHRINE HCL 5 MG/250ML IV SOLN IN NS
0.0000 ug/min | INTRAVENOUS | Status: DC
  Filled 2024-06-12: qty 250

## 2024-06-12 MED ORDER — TRANEXAMIC ACID 1000 MG/10ML IV SOLN
1.5000 mg/kg/h | INTRAVENOUS | Status: AC
  Administered 2024-06-13: 1.5 mg/kg/h via INTRAVENOUS
  Filled 2024-06-12: qty 25

## 2024-06-12 MED ORDER — VANCOMYCIN HCL 1.5 G IV SOLR
1500.0000 mg | INTRAVENOUS | Status: AC
  Administered 2024-06-13: 1500 mg via INTRAVENOUS
  Filled 2024-06-12: qty 30

## 2024-06-12 NOTE — H&P (View-Only) (Signed)
 3 Days Post-Op Procedure(s) (LRB): Coronary/Graft Acute MI Revascularization (N/A) Subjective: No chest pain or SOB. Remains on Heparin and Cangrelor.  Objective: Vital signs in last 24 hours: Temp:  [96.9 F (36.1 C)-98.7 F (37.1 C)] 98.7 F (37.1 C) (10/10 0715) Pulse Rate:  [55-88] 62 (10/10 0715) Cardiac Rhythm: Normal sinus rhythm (10/09 2110) Resp:  [13-26] 18 (10/10 0715) BP: (103-144)/(62-88) 125/79 (10/10 0715) SpO2:  [91 %-100 %] 100 % (10/10 0715)  Hemodynamic parameters for last 24 hours:    Intake/Output from previous day: 10/09 0701 - 10/10 0700 In: 825 [I.V.:825] Out: -  Intake/Output this shift: No intake/output data recorded.  General appearance: alert and cooperative Neurologic: intact Heart: regular rate and rhythm Lungs: clear to auscultation bilaterally Extremities: no edema  Lab Results: Recent Labs    06/11/24 0236 06/12/24 0331  WBC 9.7 8.8  HGB 12.8* 13.3  HCT 37.3* 38.8*  PLT 201 187   BMET:  Recent Labs    06/11/24 1323 06/12/24 0331  NA 137 133*  K 4.0 3.9  CL 101 102  CO2 26 19*  GLUCOSE 107* 105*  BUN 14 17  CREATININE 1.42* 1.12  CALCIUM 9.3 8.9    PT/INR: No results for input(s): LABPROT, INR in the last 72 hours. ABG    Component Value Date/Time   TCO2 20 (L) 06/10/2024 0003   CBG (last 3)  No results for input(s): GLUCAP in the last 72 hours.  Assessment/Plan:  Hemodynamically stable s/p STEMI and RCA PTCA. He has severe 3 V CAD and plan CABG in am. Will stop Cangrelor at 6 am and heparin on call to OR. I discussed surgery again with him and answered all of his questions.   LOS: 2 days    Dorise MARLA Fellers 06/12/2024

## 2024-06-12 NOTE — Progress Notes (Addendum)
 Advanced Heart Failure Rounding Note  Cardiologist: Gordy Bergamo, MD  Chief Complaint: STEMI Subjective:    Plan for CABG tomorrow. BP improved. SBP 120s. sCr 1.42 > 1.12   Ambulating in halls, feeling well this morning. Took a shower. No chest pain.   Objective:    Weight Range: 91.9 kg Body mass index is 30.36 kg/m.   Vital Signs:   Temp:  [97.9 F (36.6 C)-98.7 F (37.1 C)] 98.7 F (37.1 C) (10/10 0715) Pulse Rate:  [55-88] 62 (10/10 0715) Resp:  [13-26] 18 (10/10 0715) BP: (103-144)/(62-88) 125/79 (10/10 0715) SpO2:  [91 %-100 %] 100 % (10/10 0715) Last BM Date :  (PTA)  Weight change: Filed Weights   06/09/24 2329 06/10/24 0115  Weight: 88.5 kg 91.9 kg   Intake/Output:  Intake/Output Summary (Last 24 hours) at 06/12/2024 0845 Last data filed at 06/12/2024 0400 Gross per 24 hour  Intake 638.41 ml  Output --  Net 638.41 ml   Physical Exam   General: Well appearing. No distress on RA Cardiac: S1 and S2 present. No murmurs  Resp: Lung sounds clear and equal B/L Extremities: Warm and dry.  No peripheral edema.  Neuro: Alert and oriented x3. Affect pleasant. Moves all extremities without difficulty.  Telemetry   SR 50-60s (personally reviewed)  Labs    CBC Recent Labs    06/11/24 0236 06/12/24 0331  WBC 9.7 8.8  HGB 12.8* 13.3  HCT 37.3* 38.8*  MCV 94.7 94.4  PLT 201 187   Basic Metabolic Panel Recent Labs    89/90/74 1323 06/12/24 0331  NA 137 133*  K 4.0 3.9  CL 101 102  CO2 26 19*  GLUCOSE 107* 105*  BUN 14 17  CREATININE 1.42* 1.12  CALCIUM 9.3 8.9  MG 2.1 2.0   Hemoglobin A1C Recent Labs    06/10/24 0331  HGBA1C 5.2   Fasting Lipid Panel Recent Labs    06/10/24 0331  CHOL 202*  HDL 40*  LDLCALC 111*  TRIG 254*  CHOLHDL 5.1   Thyroid Function Tests Recent Labs    06/10/24 0331  TSH 2.260   Medications:    Scheduled Medications:  aspirin  81 mg Oral Daily   bisacodyl  5 mg Oral Once   Chlorhexidine  Gluconate Cloth  6 each Topical Daily   folic acid  1 mg Oral Daily   isosorbide mononitrate  30 mg Oral Daily   metoprolol succinate  25 mg Oral Daily   multivitamin with minerals  1 tablet Oral Daily   polyethylene glycol  17 g Oral BID   rosuvastatin  40 mg Oral Daily   senna  1 tablet Oral BID   sodium chloride  flush  3 mL Intravenous Q12H   thiamine  100 mg Oral Daily   Or   thiamine  100 mg Intravenous Daily    Infusions:  cangrelor (KENGREAL) 50 mg in sodium chloride  0.9 % 250 mL (0.2 mg/mL) infusion Stopped (06/11/24 1759)   heparin 1,400 Units/hr (06/11/24 1800)    PRN Medications: acetaminophen , LORazepam **OR** LORazepam, ondansetron  (ZOFRAN ) IV, sodium chloride  flush, temazepam  Patient Profile   Jacob Black is a 63 y.o. male with hx ETOH abuse, hypercholesteremia and gout. Now admitted with STEMI.   Assessment/Plan   STEMI - Cath with severe multivessel disease. S/p Ballon Angioplasty to RCA and IABP placement - IABP removed 06/10/24. - Plan to keep Cangrelor until surgery. Continue ASA. Continue hep gtt.  - plan for CABG  10/11 - no chest pain - Echo EF 50-55%   HTN  - improved - continue Imdur 30 mg daily   Tobacco Abuse - Discussed cessation  ETOH abuse - monitor for CIWA needs, does not appear to need at this time.  - cessation advised.   Plan for CABG tomorrow.  Length of Stay: 2  [place] Lee, NP  06/12/2024, 8:45 AM  Advanced Heart Failure Team Pager [phone] (M-F; 7a - 5p)  Please contact [facility] for night-coverage after hours (5p -7a ) and weekends on [email or web address]  Patient seen and examined with the above-signed Advanced Practice Provider and/or Housestaff. I personally reviewed laboratory data, imaging studies and relevant notes. I independently examined the patient and formulated the important aspects of the plan. I have edited the note to reflect any of my changes or salient points. I have personally discussed the plan with the  patient and/or family.  Remains on cangrelor. No bleeding. Ambulating halls. No CP or SOB/ BP and Scr improved. No HF on exam   General:  Well appearing. No resp difficulty HEENT: normal Neck: supple. no JVD. Carotids 2+ bilat; no bruits. No lymphadenopathy or thryomegaly appreciated. Cor: PMI nondisplaced. Regular rate & rhythm. No rubs, gallops or murmurs. Lungs: clear Abdomen: soft, nontender, nondistended. No hepatosplenomegaly. No bruits or masses. Good bowel sounds. Extremities: no cyanosis, clubbing, rash, edema Neuro: alert & orientedx3, cranial nerves grossly intact. moves all 4 extremities w/o difficulty. Affect pleasant  Stable post PCI. For CABG tomorrow. Continue cangrelor/ASA/statin/bblocker.  Meds d/w PharmD.   Toribio Fuel, MD  9:09 AM

## 2024-06-12 NOTE — Progress Notes (Signed)
 3 Days Post-Op Procedure(s) (LRB): Coronary/Graft Acute MI Revascularization (N/A) Subjective: No chest pain or SOB. Remains on Heparin and Cangrelor.  Objective: Vital signs in last 24 hours: Temp:  [96.9 F (36.1 C)-98.7 F (37.1 C)] 98.7 F (37.1 C) (10/10 0715) Pulse Rate:  [55-88] 62 (10/10 0715) Cardiac Rhythm: Normal sinus rhythm (10/09 2110) Resp:  [13-26] 18 (10/10 0715) BP: (103-144)/(62-88) 125/79 (10/10 0715) SpO2:  [91 %-100 %] 100 % (10/10 0715)  Hemodynamic parameters for last 24 hours:    Intake/Output from previous day: 10/09 0701 - 10/10 0700 In: 825 [I.V.:825] Out: -  Intake/Output this shift: No intake/output data recorded.  General appearance: alert and cooperative Neurologic: intact Heart: regular rate and rhythm Lungs: clear to auscultation bilaterally Extremities: no edema  Lab Results: Recent Labs    06/11/24 0236 06/12/24 0331  WBC 9.7 8.8  HGB 12.8* 13.3  HCT 37.3* 38.8*  PLT 201 187   BMET:  Recent Labs    06/11/24 1323 06/12/24 0331  NA 137 133*  K 4.0 3.9  CL 101 102  CO2 26 19*  GLUCOSE 107* 105*  BUN 14 17  CREATININE 1.42* 1.12  CALCIUM 9.3 8.9    PT/INR: No results for input(s): LABPROT, INR in the last 72 hours. ABG    Component Value Date/Time   TCO2 20 (L) 06/10/2024 0003   CBG (last 3)  No results for input(s): GLUCAP in the last 72 hours.  Assessment/Plan:  Hemodynamically stable s/p STEMI and RCA PTCA. He has severe 3 V CAD and plan CABG in am. Will stop Cangrelor at 6 am and heparin on call to OR. I discussed surgery again with him and answered all of his questions.   LOS: 2 days    Dorise MARLA Fellers 06/12/2024

## 2024-06-12 NOTE — Hospital Course (Addendum)
 History of Present Illness:     Jacob Black is a 63 year old male with a past medical history of hyperlipidemia, alcohol abuse (drinks about 3 liquor drinks per day), tobacco abuse (about 25 pack year history), gout (recent flare in right heel) and chronic hepatitis C. The patient developed central non radiating chest pain on 10/07 as well as nausea without vomiting, shortness of breath and palpitations. He denies diaphoresis, LOC, and cough. He called EMS who activated code STEMI and brought him to the ED, he received 324mg  ASA and 2 sublingual nitroglycerin tablets en route. EKG showed tombstone ST elevation in the inferior leads and he had ongoing chest discomfort upon arrival to the ED. He was emergently brought to the cath lab, he underwent successful percutaneous balloon angioplasty of his RCA with TIMI-3 flow established which was the culprit for his STEMI and IABP was placed. He was transferred to the ICU with resolution of his chest pain and ST elevation. 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 He has been started on IV Heparin and Cangrelor. Echocardiogram on 10/08 showed LVEF 50-55%, low normal left ventricle function, degenerative mitral valve without evidence of stenosis or regurgitation, and mild aortic valve regurgitation.    He lives with his wife in a home with about 5 steps per level. 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 He reports he was still going to work and walking many steps in the [facility] prior to this but did notice overall fatigue and shortness of breath with stairs that worsened about 6-32months ago. He also reports lock ups of chest pain that wrap around his ribs and chest for the last year or  so. He currently denies chest pain. He also reports a family history of heart attacks in both parents. His dad died of an MI and stroke at 70 and his mom died of CHF at 54.   Dr. Lucas reviewed the patient's diagnostic studies and determined he would benefit from surgical intervention. He reviewed the treatment options as well as the risks and benefits of surgery. Jacob Black was agreeable to proceed with surgery.  Hospital Course: The patient was admitted to [facility] on 10/08, IABP was weaned and removed that evening without complication. He remained on IV Cangrelor and IV heparin. He remained stable and was brought to the operating room on 06/13/24. He underwent CABG x 4 utilizing LIMA to LAD, SVG to OM, SVG to PDA, and SVG to diagonal as well as endoscopic harvest of the left greater saphenous vein and right greater saphenous vein of the thigh. He tolerated that procedure well and was transferred to the SICU in stable condition. He was extubated without complication the evening of surgery. Swan ganz catheter and arterial line were removed on POD1 without complication. Epicardial pacing wires and chest tubes were also removed on POD1. Plavix was started prior to discharge for hx of STEMI. Lopressor was started and titrated as able.

## 2024-06-12 NOTE — Progress Notes (Signed)
 CARDIAC REHAB PHASE I      Pre-op OHS education including OHS booklet, OHS handout, IS use, mobility importance, home needs at discharge and sternal precautions/move in the tube reviewed. All questions and concerns addressed. Will continue to follow.  [id number] Vaughn Asberry Hacking, RN BSN 06/12/2024 11:28 AM

## 2024-06-12 NOTE — Progress Notes (Signed)
 PHARMACY - ANTICOAGULATION CONSULT NOTE  Pharmacy Consult for heparin Indication: CAD awaiting possible CABG  Labs: Recent Labs    06/10/24 0003 06/10/24 0331 06/10/24 0331 06/11/24 0236 06/11/24 1323 06/11/24 2110 06/12/24 0331  HGB 12.9* 13.3  --  12.8*  --   --  13.3  HCT 38.0* 39.3  --  37.3*  --   --  38.8*  PLT  --  214  --  201  --   --  187  HEPARINUNFRC  --   --    < > <0.10* 0.19* 0.34 0.30  CREATININE 1.20 1.18  --   --  1.42*  --   --    < > = values in this interval not displayed.   Assessment/Plan:  63yo male remains therapeutic on heparin. Will continue infusion at current rate of 1400 units/hr and monitor daily level.  Marvetta Dauphin, PharmD, BCPS 06/12/2024 4:20 AM

## 2024-06-13 ENCOUNTER — Inpatient Hospital Stay (HOSPITAL_COMMUNITY): Payer: Self-pay | Admitting: Certified Registered Nurse Anesthetist

## 2024-06-13 ENCOUNTER — Inpatient Hospital Stay (HOSPITAL_COMMUNITY)

## 2024-06-13 ENCOUNTER — Other Ambulatory Visit: Payer: Self-pay

## 2024-06-13 ENCOUNTER — Encounter (HOSPITAL_COMMUNITY): Payer: Self-pay | Admitting: Cardiology

## 2024-06-13 ENCOUNTER — Inpatient Hospital Stay (HOSPITAL_COMMUNITY)
Admission: EM | Disposition: A | Discharge status: Home Health | Payer: Self-pay | Source: Home / Self Care | Attending: Surgery

## 2024-06-13 DIAGNOSIS — E7800 Pure hypercholesterolemia, unspecified: Secondary | ICD-10-CM | POA: Diagnosis not present

## 2024-06-13 DIAGNOSIS — I2510 Atherosclerotic heart disease of native coronary artery without angina pectoris: Secondary | ICD-10-CM | POA: Diagnosis not present

## 2024-06-13 DIAGNOSIS — Z951 Presence of aortocoronary bypass graft: Secondary | ICD-10-CM

## 2024-06-13 HISTORY — PX: CORONARY ARTERY BYPASS GRAFT: SHX141

## 2024-06-13 HISTORY — PX: INTRAOPERATIVE TRANSESOPHAGEAL ECHOCARDIOGRAM: SHX5062

## 2024-06-13 LAB — BASIC METABOLIC PANEL WITH GFR
Anion gap: 12 (ref 5–15)
Anion gap: 12 (ref 5–15)
BUN: 17 mg/dL (ref 6–20)
BUN: 13 mg/dL (ref 6–20)
CO2: 22 mmol/L (ref 22–32)
CO2: 19 mmol/L — ABNORMAL LOW (ref 22–32)
Calcium: 9.2 mg/dL (ref 8.9–10.3)
Calcium: 7.8 mg/dL — ABNORMAL LOW (ref 8.9–10.3)
Chloride: 100 mmol/L (ref 98–111)
Chloride: 105 mmol/L (ref 98–111)
Creatinine, Ser: 1.25 mg/dL — ABNORMAL HIGH (ref 0.61–1.24)
Creatinine, Ser: 1.09 mg/dL (ref 0.61–1.24)
GFR, Estimated: >60 mL/min (ref >60)
GFR, Estimated: >60 mL/min (ref >60)
Glucose, Bld: 104 mg/dL — ABNORMAL HIGH (ref 70–99)
Glucose, Bld: 109 mg/dL — ABNORMAL HIGH (ref 70–99)
Potassium: 4.1 mmol/L (ref 3.5–5.1)
Potassium: 4.4 mmol/L (ref 3.5–5.1)
Sodium: 134 mmol/L — ABNORMAL LOW (ref 135–145)
Sodium: 136 mmol/L (ref 135–145)

## 2024-06-13 LAB — POCT I-STAT EG7
Acid-base deficit: 2 mmol/L (ref 0.0–2.0)
Bicarbonate: 24 mmol/L (ref 20.0–28.0)
Calcium, Ion: 1.11 mmol/L — ABNORMAL LOW (ref 1.15–1.40)
HCT: 28 % — ABNORMAL LOW (ref 39.0–52.0)
Hemoglobin: 9.5 g/dL — ABNORMAL LOW (ref 13.0–17.0)
O2 Saturation: 90 %
Potassium: 6 mmol/L — ABNORMAL HIGH (ref 3.5–5.1)
Sodium: 137 mmol/L (ref 135–145)
TCO2: 25 mmol/L (ref 22–32)
pCO2, Ven: 43.5 mmHg — ABNORMAL LOW (ref 44–60)
pH, Ven: 7.35 (ref 7.25–7.43)
pO2, Ven: 63 mmHg — ABNORMAL HIGH (ref 32–45)

## 2024-06-13 LAB — POCT I-STAT 7, (LYTES, BLD GAS, ICA,H+H)
Acid-base deficit: 7 mmol/L — ABNORMAL HIGH (ref 0.0–2.0)
Acid-base deficit: 1 mmol/L (ref 0.0–2.0)
Acid-base deficit: 2 mmol/L (ref 0.0–2.0)
Acid-base deficit: 2 mmol/L (ref 0.0–2.0)
Acid-base deficit: 4 mmol/L — ABNORMAL HIGH (ref 0.0–2.0)
Acid-base deficit: 3 mmol/L — ABNORMAL HIGH (ref 0.0–2.0)
Acid-base deficit: 4 mmol/L — ABNORMAL HIGH (ref 0.0–2.0)
Bicarbonate: 17.8 mmol/L — ABNORMAL LOW (ref 20.0–28.0)
Bicarbonate: 22.4 mmol/L (ref 20.0–28.0)
Bicarbonate: 24.2 mmol/L (ref 20.0–28.0)
Bicarbonate: 22.5 mmol/L (ref 20.0–28.0)
Bicarbonate: 20.9 mmol/L (ref 20.0–28.0)
Bicarbonate: 21.6 mmol/L (ref 20.0–28.0)
Bicarbonate: 21 mmol/L (ref 20.0–28.0)
Calcium, Ion: 1.18 mmol/L (ref 1.15–1.40)
Calcium, Ion: 1.07 mmol/L — ABNORMAL LOW (ref 1.15–1.40)
Calcium, Ion: 1.09 mmol/L — ABNORMAL LOW (ref 1.15–1.40)
Calcium, Ion: 1.09 mmol/L — ABNORMAL LOW (ref 1.15–1.40)
Calcium, Ion: 1.11 mmol/L — ABNORMAL LOW (ref 1.15–1.40)
Calcium, Ion: 1.14 mmol/L — ABNORMAL LOW (ref 1.15–1.40)
Calcium, Ion: 1.15 mmol/L (ref 1.15–1.40)
HCT: 33 % — ABNORMAL LOW (ref 39.0–52.0)
HCT: 27 % — ABNORMAL LOW (ref 39.0–52.0)
HCT: 29 % — ABNORMAL LOW (ref 39.0–52.0)
HCT: 31 % — ABNORMAL LOW (ref 39.0–52.0)
HCT: 30 % — ABNORMAL LOW (ref 39.0–52.0)
HCT: 31 % — ABNORMAL LOW (ref 39.0–52.0)
HCT: 31 % — ABNORMAL LOW (ref 39.0–52.0)
Hemoglobin: 11.2 g/dL — ABNORMAL LOW (ref 13.0–17.0)
Hemoglobin: 9.2 g/dL — ABNORMAL LOW (ref 13.0–17.0)
Hemoglobin: 9.9 g/dL — ABNORMAL LOW (ref 13.0–17.0)
Hemoglobin: 10.5 g/dL — ABNORMAL LOW (ref 13.0–17.0)
Hemoglobin: 10.2 g/dL — ABNORMAL LOW (ref 13.0–17.0)
Hemoglobin: 10.5 g/dL — ABNORMAL LOW (ref 13.0–17.0)
Hemoglobin: 10.5 g/dL — ABNORMAL LOW (ref 13.0–17.0)
O2 Saturation: 100 %
O2 Saturation: 100 %
O2 Saturation: 100 %
O2 Saturation: 100 %
O2 Saturation: 99 %
O2 Saturation: 99 %
O2 Saturation: 97 %
Patient temperature: 36
Patient temperature: 37.1
Patient temperature: 37.4
Potassium: 3.7 mmol/L (ref 3.5–5.1)
Potassium: 5.8 mmol/L — ABNORMAL HIGH (ref 3.5–5.1)
Potassium: 6.1 mmol/L — ABNORMAL HIGH (ref 3.5–5.1)
Potassium: 5.5 mmol/L — ABNORMAL HIGH (ref 3.5–5.1)
Potassium: 4.2 mmol/L (ref 3.5–5.1)
Potassium: 4.6 mmol/L (ref 3.5–5.1)
Potassium: 4.5 mmol/L (ref 3.5–5.1)
Sodium: 138 mmol/L (ref 135–145)
Sodium: 136 mmol/L (ref 135–145)
Sodium: 137 mmol/L (ref 135–145)
Sodium: 136 mmol/L (ref 135–145)
Sodium: 138 mmol/L (ref 135–145)
Sodium: 138 mmol/L (ref 135–145)
Sodium: 137 mmol/L (ref 135–145)
TCO2: 19 mmol/L — ABNORMAL LOW (ref 22–32)
TCO2: 23 mmol/L (ref 22–32)
TCO2: 25 mmol/L (ref 22–32)
TCO2: 24 mmol/L (ref 22–32)
TCO2: 22 mmol/L (ref 22–32)
TCO2: 23 mmol/L (ref 22–32)
TCO2: 22 mmol/L (ref 22–32)
pCO2 arterial: 32.1 mmHg (ref 32–48)
pCO2 arterial: 34.3 mmHg (ref 32–48)
pCO2 arterial: 43.1 mmHg (ref 32–48)
pCO2 arterial: 37.7 mmHg (ref 32–48)
pCO2 arterial: 33.7 mmHg (ref 32–48)
pCO2 arterial: 37.6 mmHg (ref 32–48)
pCO2 arterial: 36.7 mmHg (ref 32–48)
pH, Arterial: 7.352 (ref 7.35–7.45)
pH, Arterial: 7.358 (ref 7.35–7.45)
pH, Arterial: 7.423 (ref 7.35–7.45)
pH, Arterial: 7.384 (ref 7.35–7.45)
pH, Arterial: 7.396 (ref 7.35–7.45)
pH, Arterial: 7.368 (ref 7.35–7.45)
pH, Arterial: 7.367 (ref 7.35–7.45)
pO2, Arterial: 198 mmHg — ABNORMAL HIGH (ref 83–108)
pO2, Arterial: 224 mmHg — ABNORMAL HIGH (ref 83–108)
pO2, Arterial: 401 mmHg — ABNORMAL HIGH (ref 83–108)
pO2, Arterial: 264 mmHg — ABNORMAL HIGH (ref 83–108)
pO2, Arterial: 116 mmHg — ABNORMAL HIGH (ref 83–108)
pO2, Arterial: 123 mmHg — ABNORMAL HIGH (ref 83–108)
pO2, Arterial: 91 mmHg (ref 83–108)

## 2024-06-13 LAB — POCT I-STAT, CHEM 8
BUN: 14 mg/dL (ref 6–20)
BUN: 16 mg/dL (ref 6–20)
BUN: 15 mg/dL (ref 6–20)
BUN: 14 mg/dL (ref 6–20)
BUN: 15 mg/dL (ref 6–20)
Calcium, Ion: 1.18 mmol/L (ref 1.15–1.40)
Calcium, Ion: 1.29 mmol/L (ref 1.15–1.40)
Calcium, Ion: 1.05 mmol/L — ABNORMAL LOW (ref 1.15–1.40)
Calcium, Ion: 1.1 mmol/L — ABNORMAL LOW (ref 1.15–1.40)
Calcium, Ion: 1.11 mmol/L — ABNORMAL LOW (ref 1.15–1.40)
Chloride: 107 mmol/L (ref 98–111)
Chloride: 105 mmol/L (ref 98–111)
Chloride: 103 mmol/L (ref 98–111)
Chloride: 102 mmol/L (ref 98–111)
Chloride: 102 mmol/L (ref 98–111)
Creatinine, Ser: 1 mg/dL (ref 0.61–1.24)
Creatinine, Ser: 1.1 mg/dL (ref 0.61–1.24)
Creatinine, Ser: 0.9 mg/dL (ref 0.61–1.24)
Creatinine, Ser: 1 mg/dL (ref 0.61–1.24)
Creatinine, Ser: 1.1 mg/dL (ref 0.61–1.24)
Glucose, Bld: 115 mg/dL — ABNORMAL HIGH (ref 70–99)
Glucose, Bld: 105 mg/dL — ABNORMAL HIGH (ref 70–99)
Glucose, Bld: 98 mg/dL (ref 70–99)
Glucose, Bld: 117 mg/dL — ABNORMAL HIGH (ref 70–99)
Glucose, Bld: 124 mg/dL — ABNORMAL HIGH (ref 70–99)
HCT: 33 % — ABNORMAL LOW (ref 39.0–52.0)
HCT: 37 % — ABNORMAL LOW (ref 39.0–52.0)
HCT: 27 % — ABNORMAL LOW (ref 39.0–52.0)
HCT: 30 % — ABNORMAL LOW (ref 39.0–52.0)
HCT: 31 % — ABNORMAL LOW (ref 39.0–52.0)
Hemoglobin: 11.2 g/dL — ABNORMAL LOW (ref 13.0–17.0)
Hemoglobin: 12.6 g/dL — ABNORMAL LOW (ref 13.0–17.0)
Hemoglobin: 9.2 g/dL — ABNORMAL LOW (ref 13.0–17.0)
Hemoglobin: 10.2 g/dL — ABNORMAL LOW (ref 13.0–17.0)
Hemoglobin: 10.5 g/dL — ABNORMAL LOW (ref 13.0–17.0)
Potassium: 3.7 mmol/L (ref 3.5–5.1)
Potassium: 4.6 mmol/L (ref 3.5–5.1)
Potassium: 6.6 mmol/L (ref 3.5–5.1)
Potassium: 5.6 mmol/L — ABNORMAL HIGH (ref 3.5–5.1)
Potassium: 6.4 mmol/L (ref 3.5–5.1)
Sodium: 139 mmol/L (ref 135–145)
Sodium: 138 mmol/L (ref 135–145)
Sodium: 136 mmol/L (ref 135–145)
Sodium: 136 mmol/L (ref 135–145)
Sodium: 136 mmol/L (ref 135–145)
TCO2: 19 mmol/L — ABNORMAL LOW (ref 22–32)
TCO2: 23 mmol/L (ref 22–32)
TCO2: 23 mmol/L (ref 22–32)
TCO2: 23 mmol/L (ref 22–32)
TCO2: 25 mmol/L (ref 22–32)

## 2024-06-13 LAB — CBC
HCT: 40.5 % (ref 39.0–52.0)
HCT: 30.5 % — ABNORMAL LOW (ref 39.0–52.0)
HCT: 33.8 % — ABNORMAL LOW (ref 39.0–52.0)
Hemoglobin: 13.6 g/dL (ref 13.0–17.0)
Hemoglobin: 10.3 g/dL — ABNORMAL LOW (ref 13.0–17.0)
Hemoglobin: 11.2 g/dL — ABNORMAL LOW (ref 13.0–17.0)
MCH: 31.7 pg (ref 26.0–34.0)
MCH: 32.4 pg (ref 26.0–34.0)
MCH: 31.9 pg (ref 26.0–34.0)
MCHC: 33.6 g/dL (ref 30.0–36.0)
MCHC: 33.8 g/dL (ref 30.0–36.0)
MCHC: 33.1 g/dL (ref 30.0–36.0)
MCV: 94.4 fL (ref 80.0–100.0)
MCV: 95.9 fL (ref 80.0–100.0)
MCV: 96.3 fL (ref 80.0–100.0)
Platelets: 195 K/uL (ref 150–400)
Platelets: 117 K/uL — ABNORMAL LOW (ref 150–400)
Platelets: 148 K/uL — ABNORMAL LOW (ref 150–400)
RBC: 4.29 MIL/uL (ref 4.22–5.81)
RBC: 3.18 MIL/uL — ABNORMAL LOW (ref 4.22–5.81)
RBC: 3.51 MIL/uL — ABNORMAL LOW (ref 4.22–5.81)
RDW: 12.7 % (ref 11.5–15.5)
RDW: 12.6 % (ref 11.5–15.5)
RDW: 12.7 % (ref 11.5–15.5)
WBC: 8 K/uL (ref 4.0–10.5)
WBC: 10.3 K/uL (ref 4.0–10.5)
WBC: 10.2 K/uL (ref 4.0–10.5)
nRBC: 0 % (ref 0.0–0.2)
nRBC: 0 % (ref 0.0–0.2)
nRBC: 0 % (ref 0.0–0.2)

## 2024-06-13 LAB — GLUCOSE, CAPILLARY
Glucose-Capillary: 116 mg/dL — ABNORMAL HIGH (ref 70–99)
Glucose-Capillary: 127 mg/dL — ABNORMAL HIGH (ref 70–99)
Glucose-Capillary: 132 mg/dL — ABNORMAL HIGH (ref 70–99)
Glucose-Capillary: 118 mg/dL — ABNORMAL HIGH (ref 70–99)
Glucose-Capillary: 126 mg/dL — ABNORMAL HIGH (ref 70–99)
Glucose-Capillary: 127 mg/dL — ABNORMAL HIGH (ref 70–99)
Glucose-Capillary: 151 mg/dL — ABNORMAL HIGH (ref 70–99)

## 2024-06-13 LAB — PROTIME-INR
INR: 1.4 — ABNORMAL HIGH (ref 0.8–1.2)
Prothrombin Time: 17.4 s — ABNORMAL HIGH (ref 11.4–15.2)

## 2024-06-13 LAB — MAGNESIUM
Magnesium: 2.1 mg/dL (ref 1.7–2.4)
Magnesium: 3.1 mg/dL — ABNORMAL HIGH (ref 1.7–2.4)

## 2024-06-13 LAB — APTT: aPTT: 34 s (ref 24–36)

## 2024-06-13 LAB — PLATELET COUNT: Platelets: 152 K/uL (ref 150–400)

## 2024-06-13 LAB — HEMOGLOBIN AND HEMATOCRIT, BLOOD
HCT: 30.4 % — ABNORMAL LOW (ref 39.0–52.0)
Hemoglobin: 10.3 g/dL — ABNORMAL LOW (ref 13.0–17.0)

## 2024-06-13 SURGERY — CORONARY ARTERY BYPASS GRAFTING (CABG)
Anesthesia: General | Site: Chest

## 2024-06-13 MED ORDER — LACTATED RINGERS IV SOLN: INTRAVENOUS | Status: DC

## 2024-06-13 MED ORDER — PHENYLEPHRINE HCL-NACL 20-0.9 MG/250ML-% IV SOLN: 0.0000 ug/min | INTRAVENOUS | Status: DC

## 2024-06-13 MED ORDER — PLASMA-LYTE A IV SOLN: INTRAVENOUS | Status: DC | PRN

## 2024-06-13 MED ORDER — FENTANYL CITRATE (PF) 250 MCG/5ML IJ SOLN
INTRAMUSCULAR | Status: AC
  Filled 2024-06-13: qty 5

## 2024-06-13 MED ORDER — SODIUM CHLORIDE 0.9 % IV SOLN: 250.0000 mL | INTRAVENOUS | Status: DC

## 2024-06-13 MED ORDER — CHLORHEXIDINE GLUCONATE 0.12 % MT SOLN
15.0000 mL | OROMUCOSAL | Status: AC
  Administered 2024-06-13: 15 mL via OROMUCOSAL

## 2024-06-13 MED ORDER — OXYCODONE HCL 5 MG PO TABS
5.0000 mg | ORAL_TABLET | ORAL | Status: DC | PRN
  Administered 2024-06-13 – 2024-06-14 (×3): 10 mg via ORAL
  Administered 2024-06-14: 5 mg via ORAL
  Administered 2024-06-14 – 2024-06-18 (×17): 10 mg via ORAL
  Filled 2024-06-13 (×21): qty 2

## 2024-06-13 MED ORDER — MAGNESIUM SULFATE 4 GM/100ML IV SOLN
4.0000 g | Freq: Once | INTRAVENOUS | Status: AC
  Administered 2024-06-13: 4 g via INTRAVENOUS
  Filled 2024-06-13: qty 100

## 2024-06-13 MED ORDER — HEPARIN SODIUM (PORCINE) 1000 UNIT/ML IJ SOLN
INTRAMUSCULAR | Status: AC
Start: 2024-06-13 — End: 2024-06-13
  Filled 2024-06-13: qty 1

## 2024-06-13 MED ORDER — CHLORHEXIDINE GLUCONATE CLOTH 2 % EX PADS
6.0000 | MEDICATED_PAD | Freq: Every day | CUTANEOUS | Status: DC
  Administered 2024-06-13 – 2024-06-14 (×2): 6 via TOPICAL

## 2024-06-13 MED ORDER — ARTIFICIAL TEARS OPHTHALMIC OINT
TOPICAL_OINTMENT | OPHTHALMIC | Status: DC | PRN
Start: 2024-06-13 — End: 2024-06-13
  Administered 2024-06-13: 1 via OPHTHALMIC

## 2024-06-13 MED ORDER — METOPROLOL TARTRATE 12.5 MG HALF TABLET
12.5000 mg | ORAL_TABLET | Freq: Two times a day (BID) | ORAL | Status: DC
  Administered 2024-06-13 – 2024-06-14 (×2): 12.5 mg via ORAL
  Filled 2024-06-13 (×2): qty 1

## 2024-06-13 MED ORDER — LACTATED RINGERS IV SOLN: INTRAVENOUS | Status: DC | PRN

## 2024-06-13 MED ORDER — METOPROLOL TARTRATE 5 MG/5ML IV SOLN
2.5000 mg | INTRAVENOUS | Status: DC | PRN
  Filled 2024-06-13: qty 5

## 2024-06-13 MED ORDER — MORPHINE SULFATE (PF) 2 MG/ML IV SOLN
1.0000 mg | INTRAVENOUS | Status: DC | PRN
  Administered 2024-06-13: 1 mg via INTRAVENOUS
  Administered 2024-06-13 – 2024-06-14 (×6): 2 mg via INTRAVENOUS
  Filled 2024-06-13: qty 2
  Filled 2024-06-13 (×5): qty 1

## 2024-06-13 MED ORDER — ASPIRIN 81 MG PO CHEW: 324.0000 mg | CHEWABLE_TABLET | Freq: Every day | ORAL | Status: DC

## 2024-06-13 MED ORDER — HEMOSTATIC AGENTS (NO CHARGE) OPTIME
TOPICAL | Status: DC | PRN
  Administered 2024-06-13: 1 via TOPICAL

## 2024-06-13 MED ORDER — THROMBIN (RECOMBINANT) 20000 UNITS EX SOLR
CUTANEOUS | Status: AC
  Filled 2024-06-13: qty 20000

## 2024-06-13 MED ORDER — PROPOFOL 10 MG/ML IV BOLUS
INTRAVENOUS | Status: DC | PRN
  Administered 2024-06-13: 80 mg via INTRAVENOUS
  Administered 2024-06-13: 20 mg via INTRAVENOUS
  Administered 2024-06-13: 40 mg via INTRAVENOUS
  Administered 2024-06-13: 30 mg via INTRAVENOUS

## 2024-06-13 MED ORDER — EPHEDRINE 5 MG/ML INJ
INTRAVENOUS | Status: AC
  Filled 2024-06-13: qty 5

## 2024-06-13 MED ORDER — CEFAZOLIN SODIUM-DEXTROSE 2-4 GM/100ML-% IV SOLN
2.0000 g | Freq: Three times a day (TID) | INTRAVENOUS | Status: AC
  Administered 2024-06-13 – 2024-06-15 (×6): 2 g via INTRAVENOUS
  Filled 2024-06-13 (×6): qty 100

## 2024-06-13 MED ORDER — ONDANSETRON HCL 4 MG/2ML IJ SOLN
INTRAMUSCULAR | Status: AC
  Filled 2024-06-13: qty 2

## 2024-06-13 MED ORDER — DEXTROSE 50 % IV SOLN: 0.0000 mL | INTRAVENOUS | Status: DC | PRN

## 2024-06-13 MED ORDER — NITROGLYCERIN IN D5W 200-5 MCG/ML-% IV SOLN: 0.0000 ug/min | INTRAVENOUS | Status: DC

## 2024-06-13 MED ORDER — BISACODYL 5 MG PO TBEC
10.0000 mg | DELAYED_RELEASE_TABLET | Freq: Every day | ORAL | Status: DC
  Administered 2024-06-14: 10 mg via ORAL
  Filled 2024-06-13: qty 2

## 2024-06-13 MED ORDER — ALLOPURINOL 300 MG PO TABS
300.0000 mg | ORAL_TABLET | Freq: Every day | ORAL | Status: DC
  Administered 2024-06-14 – 2024-06-18 (×5): 300 mg via ORAL
  Filled 2024-06-13 (×6): qty 1

## 2024-06-13 MED ORDER — DEXMEDETOMIDINE HCL IN NACL 400 MCG/100ML IV SOLN: 0.0000 ug/kg/h | INTRAVENOUS | Status: DC

## 2024-06-13 MED ORDER — INSULIN REGULAR(HUMAN) IN NACL 100-0.9 UT/100ML-% IV SOLN
INTRAVENOUS | Status: DC
Start: 2024-06-13 — End: 2024-06-14

## 2024-06-13 MED ORDER — SODIUM CHLORIDE 0.45 % IV SOLN: INTRAVENOUS | Status: DC | PRN

## 2024-06-13 MED ORDER — HEPARIN SODIUM (PORCINE) 1000 UNIT/ML IJ SOLN
INTRAMUSCULAR | Status: DC | PRN
  Administered 2024-06-13: 34000 [IU] via INTRAVENOUS

## 2024-06-13 MED ORDER — SODIUM CHLORIDE 0.9 % IV SOLN: INTRAVENOUS | Status: DC

## 2024-06-13 MED ORDER — ROCURONIUM BROMIDE 10 MG/ML (PF) SYRINGE
PREFILLED_SYRINGE | INTRAVENOUS | Status: DC | PRN
  Administered 2024-06-13: 20 mg via INTRAVENOUS
  Administered 2024-06-13: 50 mg via INTRAVENOUS
  Administered 2024-06-13: 100 mg via INTRAVENOUS

## 2024-06-13 MED ORDER — PANTOPRAZOLE SODIUM 40 MG IV SOLR
40.0000 mg | Freq: Every day | INTRAVENOUS | Status: AC
  Administered 2024-06-13 – 2024-06-14 (×2): 40 mg via INTRAVENOUS
  Filled 2024-06-13 (×2): qty 10

## 2024-06-13 MED ORDER — METOCLOPRAMIDE HCL 5 MG/ML IJ SOLN
10.0000 mg | Freq: Four times a day (QID) | INTRAMUSCULAR | Status: AC
  Administered 2024-06-13 – 2024-06-14 (×6): 10 mg via INTRAVENOUS
  Filled 2024-06-13 (×6): qty 2

## 2024-06-13 MED ORDER — METOPROLOL TARTRATE 25 MG/10 ML ORAL SUSPENSION: 12.5000 mg | Freq: Two times a day (BID) | ORAL | Status: DC

## 2024-06-13 MED ORDER — TRAMADOL HCL 50 MG PO TABS
50.0000 mg | ORAL_TABLET | ORAL | Status: DC | PRN
  Administered 2024-06-14 – 2024-06-16 (×7): 100 mg via ORAL
  Administered 2024-06-17: 50 mg via ORAL
  Filled 2024-06-13: qty 2
  Filled 2024-06-13: qty 1
  Filled 2024-06-13 (×6): qty 2

## 2024-06-13 MED ORDER — ROCURONIUM BROMIDE 10 MG/ML (PF) SYRINGE
PREFILLED_SYRINGE | INTRAVENOUS | Status: AC
  Filled 2024-06-13: qty 10

## 2024-06-13 MED ORDER — ACETAMINOPHEN 160 MG/5ML PO SOLN
650.0000 mg | Freq: Once | ORAL | Status: AC
  Administered 2024-06-13: 650 mg
  Filled 2024-06-13: qty 20.3

## 2024-06-13 MED ORDER — THROMBIN 20000 UNITS EX SOLR: OROMUCOSAL | Status: DC | PRN

## 2024-06-13 MED ORDER — LACTATED RINGERS IV SOLN: INTRAVENOUS | Status: AC

## 2024-06-13 MED ORDER — ACETAMINOPHEN 160 MG/5ML PO SOLN
1000.0000 mg | Freq: Four times a day (QID) | ORAL | Status: DC
  Filled 2024-06-13: qty 40.6

## 2024-06-13 MED ORDER — PROTAMINE SULFATE 10 MG/ML IV SOLN
INTRAVENOUS | Status: DC | PRN
  Administered 2024-06-13: 10 mg via INTRAVENOUS
  Administered 2024-06-13: 290 mg via INTRAVENOUS

## 2024-06-13 MED ORDER — ACETAMINOPHEN 500 MG PO TABS
1000.0000 mg | ORAL_TABLET | Freq: Four times a day (QID) | ORAL | Status: DC
  Administered 2024-06-13 – 2024-06-18 (×17): 1000 mg via ORAL
  Filled 2024-06-13 (×19): qty 2

## 2024-06-13 MED ORDER — VANCOMYCIN HCL IN DEXTROSE 1-5 GM/200ML-% IV SOLN
1000.0000 mg | Freq: Once | INTRAVENOUS | Status: AC
  Administered 2024-06-13: 1000 mg via INTRAVENOUS
  Filled 2024-06-13: qty 200

## 2024-06-13 MED ORDER — MIDAZOLAM HCL 5 MG/5ML IJ SOLN
INTRAMUSCULAR | Status: DC | PRN
  Administered 2024-06-13 (×2): 1 mg via INTRAVENOUS
  Administered 2024-06-13: 2 mg via INTRAVENOUS
  Administered 2024-06-13 (×3): 1 mg via INTRAVENOUS

## 2024-06-13 MED ORDER — PANTOPRAZOLE SODIUM 40 MG PO TBEC
40.0000 mg | DELAYED_RELEASE_TABLET | Freq: Every day | ORAL | Status: DC
  Administered 2024-06-15 – 2024-06-18 (×4): 40 mg via ORAL
  Filled 2024-06-13 (×4): qty 1

## 2024-06-13 MED ORDER — BISACODYL 10 MG RE SUPP: 10.0000 mg | Freq: Every day | RECTAL | Status: DC

## 2024-06-13 MED ORDER — ALBUMIN HUMAN 5 % IV SOLN
INTRAVENOUS | Status: DC | PRN
Start: 2024-06-13 — End: 2024-06-13

## 2024-06-13 MED ORDER — ONDANSETRON HCL 4 MG/2ML IJ SOLN
INTRAMUSCULAR | Status: DC | PRN
Start: 2024-06-13 — End: 2024-06-13
  Administered 2024-06-13: 4 mg via INTRAVENOUS

## 2024-06-13 MED ORDER — MIDAZOLAM HCL 2 MG/2ML IJ SOLN: 2.0000 mg | INTRAMUSCULAR | Status: DC | PRN

## 2024-06-13 MED ORDER — LACTATED RINGERS IV SOLN
INTRAVENOUS | Status: DC | PRN
Start: 2024-06-13 — End: 2024-06-13

## 2024-06-13 MED ORDER — SODIUM CHLORIDE 0.9% FLUSH
3.0000 mL | Freq: Two times a day (BID) | INTRAVENOUS | Status: DC
  Administered 2024-06-14 (×2): 3 mL via INTRAVENOUS

## 2024-06-13 MED ORDER — PROPOFOL 10 MG/ML IV BOLUS
INTRAVENOUS | Status: AC
  Filled 2024-06-13: qty 20

## 2024-06-13 MED ORDER — POTASSIUM CHLORIDE 10 MEQ/50ML IV SOLN: 10.0000 meq | INTRAVENOUS | Status: AC

## 2024-06-13 MED ORDER — ONDANSETRON HCL 4 MG/2ML IJ SOLN
4.0000 mg | Freq: Four times a day (QID) | INTRAMUSCULAR | Status: DC | PRN
  Filled 2024-06-13: qty 2

## 2024-06-13 MED ORDER — FENTANYL CITRATE (PF) 250 MCG/5ML IJ SOLN
INTRAMUSCULAR | Status: DC | PRN
  Administered 2024-06-13 (×2): 150 ug via INTRAVENOUS
  Administered 2024-06-13 (×2): 50 ug via INTRAVENOUS
  Administered 2024-06-13: 150 ug via INTRAVENOUS
  Administered 2024-06-13 (×2): 50 ug via INTRAVENOUS
  Administered 2024-06-13: 100 ug via INTRAVENOUS
  Administered 2024-06-13: 200 ug via INTRAVENOUS
  Administered 2024-06-13: 50 ug via INTRAVENOUS

## 2024-06-13 MED ORDER — EPHEDRINE SULFATE-NACL 50-0.9 MG/10ML-% IV SOSY
PREFILLED_SYRINGE | INTRAVENOUS | Status: DC | PRN
Start: 2024-06-13 — End: 2024-06-13
  Administered 2024-06-13 (×2): 5 mg via INTRAVENOUS

## 2024-06-13 MED ORDER — MIDAZOLAM HCL (PF) 10 MG/2ML IJ SOLN
INTRAMUSCULAR | Status: AC
Start: 2024-06-13 — End: 2024-06-13
  Filled 2024-06-13: qty 2

## 2024-06-13 MED ORDER — ASPIRIN 325 MG PO TBEC
325.0000 mg | DELAYED_RELEASE_TABLET | Freq: Every day | ORAL | Status: DC
  Administered 2024-06-14: 325 mg via ORAL
  Filled 2024-06-13: qty 1

## 2024-06-13 MED ORDER — DOCUSATE SODIUM 100 MG PO CAPS
200.0000 mg | ORAL_CAPSULE | Freq: Every day | ORAL | Status: DC
  Administered 2024-06-14 – 2024-06-18 (×5): 200 mg via ORAL
  Filled 2024-06-13 (×5): qty 2

## 2024-06-13 MED ORDER — ALBUMIN HUMAN 5 % IV SOLN
250.0000 mL | INTRAVENOUS | Status: DC | PRN
  Administered 2024-06-13: 12.5 g via INTRAVENOUS

## 2024-06-13 MED ORDER — SODIUM CHLORIDE 0.9% FLUSH: 3.0000 mL | INTRAVENOUS | Status: DC | PRN

## 2024-06-13 MED ORDER — ASPIRIN 81 MG PO CHEW
324.0000 mg | CHEWABLE_TABLET | Freq: Once | ORAL | Status: AC
  Administered 2024-06-13: 324 mg via ORAL
  Filled 2024-06-13: qty 4

## 2024-06-13 MED ORDER — 0.9 % SODIUM CHLORIDE (POUR BTL) OPTIME
TOPICAL | Status: DC | PRN
  Administered 2024-06-13: 5000 mL

## 2024-06-13 SURGICAL SUPPLY — 88 items
BAG DECANTER FOR FLEXI CONT (MISCELLANEOUS) ×3 IMPLANT
BLADE CLIPPER SURG (BLADE) ×3 IMPLANT
BLADE STERNUM SYSTEM 6 (BLADE) ×3 IMPLANT
BNDG ELASTIC 4INX 5YD STR LF (GAUZE/BANDAGES/DRESSINGS) IMPLANT
BNDG ELASTIC 4X5.8 VLCR STR LF (GAUZE/BANDAGES/DRESSINGS) ×3 IMPLANT
BNDG ELASTIC 6INX 5YD STR LF (GAUZE/BANDAGES/DRESSINGS) ×3 IMPLANT
BNDG GAUZE DERMACEA FLUFF 4 (GAUZE/BANDAGES/DRESSINGS) ×3 IMPLANT
CANISTER SUCTION 3000ML PPV (SUCTIONS) ×3 IMPLANT
CANNULA AORTIC ROOT 9FR (CANNULA) ×3 IMPLANT
CANNULA ARTERIAL NVNT 3/8 20FR (MISCELLANEOUS) IMPLANT
CANNULA MC2 2 STG 36/46 NON-V (CANNULA) IMPLANT
CANNULA VESSEL 3MM BLUNT TIP (CANNULA) IMPLANT
CATH ROBINSON RED A/P 18FR (CATHETERS) ×6 IMPLANT
CATH THORACIC 28FR (CATHETERS) ×3 IMPLANT
CATH THORACIC 36FR (CATHETERS) ×3 IMPLANT
CATH THORACIC 36FR RT ANG (CATHETERS) ×3 IMPLANT
CLIP TI WIDE RED SMALL 24 (CLIP) IMPLANT
CONTAINER PROTECT SURGISLUSH (MISCELLANEOUS) ×6 IMPLANT
DERMABOND ADVANCED .7 DNX12 (GAUZE/BANDAGES/DRESSINGS) IMPLANT
DRAPE SRG 135X102X78XABS (DRAPES) ×3 IMPLANT
DRAPE WARM FLUID 44X44 (DRAPES) ×3 IMPLANT
DRSG COVADERM 4X10 (GAUZE/BANDAGES/DRESSINGS) IMPLANT
DRSG COVADERM 4X14 (GAUZE/BANDAGES/DRESSINGS) ×3 IMPLANT
ELECT CAUTERY BLADE 6.4 (BLADE) ×3 IMPLANT
ELECTRODE BLDE 4.0 EZ CLN MEGD (MISCELLANEOUS) ×3 IMPLANT
ELECTRODE REM PT RTRN 9FT ADLT (ELECTROSURGICAL) ×6 IMPLANT
FELT TEFLON 1X6 (MISCELLANEOUS) ×3 IMPLANT
GAUZE SPONGE 4X4 12PLY STRL (GAUZE/BANDAGES/DRESSINGS) ×6 IMPLANT
GAUZE SPONGE 4X4 12PLY STRL LF (GAUZE/BANDAGES/DRESSINGS) IMPLANT
GLOVE BIO SURGEON STRL SZ 6 (GLOVE) IMPLANT
GLOVE BIO SURGEON STRL SZ 6.5 (GLOVE) IMPLANT
GLOVE BIOGEL PI IND STRL 6.5 (GLOVE) IMPLANT
GLOVE BIOGEL PI IND STRL 7.0 (GLOVE) IMPLANT
GLOVE ECLIPSE 7.0 STRL STRAW (GLOVE) ×6 IMPLANT
GLOVE ORTHO TXT STRL SZ7.5 (GLOVE) IMPLANT
GOWN STRL REUS W/ TWL LRG LVL3 (GOWN DISPOSABLE) ×12 IMPLANT
GOWN STRL REUS W/ TWL XL LVL3 (GOWN DISPOSABLE) ×3 IMPLANT
HEMOSTAT POWDER SURGIFOAM 1G (HEMOSTASIS) ×9 IMPLANT
HEMOSTAT SURGICEL 2X14 (HEMOSTASIS) ×3 IMPLANT
KIT BASIN OR (CUSTOM PROCEDURE TRAY) ×3 IMPLANT
KIT SUCTION CATH 14FR (SUCTIONS) ×3 IMPLANT
KIT TURNOVER KIT B (KITS) ×3 IMPLANT
KIT VASOVIEW HEMOPRO 2 VH 4000 (KITS) ×3 IMPLANT
KNIFE MICRO-UNI 3.5 30 DEG (BLADE) ×3 IMPLANT
PACK E OPEN HEART (SUTURE) ×3 IMPLANT
PACK OPEN HEART (CUSTOM PROCEDURE TRAY) ×3 IMPLANT
PAD ARMBOARD POSITIONER FOAM (MISCELLANEOUS) ×6 IMPLANT
PAD ELECT DEFIB RADIOL ZOLL (MISCELLANEOUS) ×3 IMPLANT
PENCIL BUTTON HOLSTER BLD 10FT (ELECTRODE) ×3 IMPLANT
POSITIONER HEAD DONUT 9IN (MISCELLANEOUS) ×3 IMPLANT
PUNCH AORTIC ROTATE 4.0MM (MISCELLANEOUS) IMPLANT
PUNCH AORTIC ROTATE 4.5MM 8IN (MISCELLANEOUS) ×3 IMPLANT
SEALANT SURG COSEAL 8ML (VASCULAR PRODUCTS) IMPLANT
SET MPS 3-ND DEL (MISCELLANEOUS) IMPLANT
SOLN 0.9% NACL 1000 ML (IV SOLUTION) ×10 IMPLANT
SOLN 0.9% NACL POUR BTL 1000ML (IV SOLUTION) ×15 IMPLANT
SOLN STERILE WATER 1000 ML (IV SOLUTION) ×4 IMPLANT
SOLN STERILE WATER BTL 1000 ML (IV SOLUTION) ×6 IMPLANT
SOLUTION ANTFG W/FOAM PAD STRL (MISCELLANEOUS) IMPLANT
SPONGE T-LAP 18X18 ~~LOC~~+RFID (SPONGE) IMPLANT
SPONGE T-LAP 4X18 ~~LOC~~+RFID (SPONGE) IMPLANT
SUPPORT HEART JANKE-BARRON (MISCELLANEOUS) ×3 IMPLANT
SUT BONE WAX W31G (SUTURE) ×3 IMPLANT
SUT MNCRL AB 4-0 PS2 18 (SUTURE) IMPLANT
SUT PROLENE 3 0 SH1 36 (SUTURE) ×3 IMPLANT
SUT PROLENE 4 0 SH DA (SUTURE) IMPLANT
SUT PROLENE 4-0 RB1 .5 CRCL 36 (SUTURE) IMPLANT
SUT PROLENE 5 0 C 1 36 (SUTURE) IMPLANT
SUT PROLENE 6 0 C 1 30 (SUTURE) IMPLANT
SUT PROLENE 7 0 BV 1 (SUTURE) IMPLANT
SUT PROLENE 7 0 BV1 MDA (SUTURE) ×3 IMPLANT
SUT PROLENE 8 0 BV175 6 (SUTURE) IMPLANT
SUT SILK 1 MH (SUTURE) IMPLANT
SUT SILK 2 0 SH CR/8 (SUTURE) IMPLANT
SUT STEEL STERNAL CCS#1 18IN (SUTURE) IMPLANT
SUT STEEL SZ 6 DBL 3X14 BALL (SUTURE) IMPLANT
SUT VIC AB 1 CTX36XBRD ANBCTR (SUTURE) ×6 IMPLANT
SUT VIC AB 2-0 CT1 TAPERPNT 27 (SUTURE) IMPLANT
SYSTEM SAHARA CHEST DRAIN ATS (WOUND CARE) ×3 IMPLANT
TAPE CLOTH SURG 4X10 WHT LF (GAUZE/BANDAGES/DRESSINGS) IMPLANT
TAPE PAPER 2X10 WHT MICROPORE (GAUZE/BANDAGES/DRESSINGS) IMPLANT
TOWEL GREEN STERILE (TOWEL DISPOSABLE) ×3 IMPLANT
TOWEL GREEN STERILE FF (TOWEL DISPOSABLE) ×3 IMPLANT
TRAY FOLEY SLVR 16FR TEMP STAT (SET/KITS/TRAYS/PACK) ×3 IMPLANT
TUBE SUCT INTRACARD DLP 20F (MISCELLANEOUS) ×3 IMPLANT
TUBE SUCTION CARDIAC 10FR (CANNULA) ×3 IMPLANT
TUBING LAP HI FLOW INSUFFLATIO (TUBING) ×3 IMPLANT
UNDERPAD 30X36 HEAVY ABSORB (UNDERPADS AND DIAPERS) ×3 IMPLANT

## 2024-06-13 NOTE — Brief Op Note (Addendum)
 06/09/2024 - 06/13/2024  1:57 PM  PATIENT:  Jacob Black  63 y.o. male  PRE-OPERATIVE DIAGNOSIS:  CAD STEMI  POST-OPERATIVE DIAGNOSIS:  CAD, STEMI  PROCEDURE:   CORONARY ARTERY BYPASS GRAFTING (CABG) X FOUR  LEFT INTERNAL MAMMARY ARTERY HARVEST LEFT LEG GREATER SAPHENOUS VEIN AND RIGHT THIGH GREATER SAPHENOUS VEIN HARVESTED ENDOSCOPICALLY  ECHOCARDIOGRAM, TRANSESOPHAGEAL, INTRAOPERATIVE Vein harvest time: Vein prep time: -LIMA to LAD -SVG to PDA -SVG to OM -SVG to Diagonal  SURGEON:  Surgeons and Role:    * Lucas Dorise POUR, MD - Primary  PHYSICIAN ASSISTANT:  [place] PA-C  ASSISTANTS: Jerel Fees RNFA   ANESTHESIA:   general  EBL:  650 mL   BLOOD ADMINISTERED:none  DRAINS: Mediastinal drains   LOCAL MEDICATIONS USED:  NONE  SPECIMEN:  No Specimen  DISPOSITION OF SPECIMEN:  N/A  COUNTS:  YES  TOURNIQUET:  * No tourniquets in log *  DICTATION: .Dragon Dictation  PLAN OF CARE: Admit to inpatient   PATIENT DISPOSITION:  ICU - intubated and hemodynamically stable.   Delay start of Pharmacological VTE agent (>24hrs) due to surgical blood loss or risk of bleeding: yes

## 2024-06-13 NOTE — Procedures (Signed)
 Extubation Procedure Note  Patient Details:   Name: Jacob Black DOB: 1961-06-16 MRN: [id number]   Airway Documentation:    Vent end date: 06/13/24 Vent end time: 1848   Evaluation  O2 sats: stable throughout Complications: No apparent complications Patient did tolerate procedure well. Bilateral Breath Sounds: Clear   Yes  VC NIF -25. Positive cuff leak prior to extubation. Pt placed on 4L [place]  [name] 06/13/2024, 6:49 PM

## 2024-06-13 NOTE — Transfer of Care (Signed)
 Immediate Anesthesia Transfer of Care Note  Patient: Jacob Black  Procedure(s) Performed: CORONARY ARTERY BYPASS GRAFTING (CABG) X FOUR USING LEFT INTERNAL MAMMARY ARTERY AND BILATERAL GREATER SAPHENOUS VEIN HARVESTED ENDOSCOPICALLY (Chest) ECHOCARDIOGRAM, TRANSESOPHAGEAL, INTRAOPERATIVE  Patient Location: SICU  Anesthesia Type:General  Level of Consciousness: sedated and Patient remains intubated per anesthesia plan  Airway & Oxygen  Therapy: Patient remains intubated per anesthesia plan and Patient placed on Ventilator (see vital sign flow sheet for setting)  Post-op Assessment: Report given to RN and Post -op Vital signs reviewed and stable  Post vital signs: Reviewed and stable  Last Vitals:  Vitals Value Taken Time  BP 95/63 06/13/24 14:41  Temp 36 C 06/13/24 14:55  Pulse 80 06/13/24 14:55  Resp 16 06/13/24 14:55  SpO2 99 % 06/13/24 14:55  Vitals shown include unfiled device data.  Last Pain:  Vitals:   06/13/24 0641  TempSrc: Oral  PainSc: 0-No pain         Complications: No notable events documented.

## 2024-06-13 NOTE — Anesthesia Procedure Notes (Signed)
 Arterial Line Insertion Start/End10/07/2024 8:07 AM, 06/13/2024 8:10 AM Performed by: Leopoldo Wanda DASEN, CRNA, CRNA  Patient location: OR. Preanesthetic checklist: patient identified, IV checked, site marked, risks and benefits discussed, surgical consent, monitors and equipment checked, pre-op evaluation, timeout performed and anesthesia consent Patient sedated Left, radial was placed Catheter size: 18 G Hand hygiene performed  and maximum sterile barriers used   Attempts: 1 Following insertion, dressing applied and Biopatch. Post procedure assessment: normal  Patient tolerated the procedure well with no immediate complications.

## 2024-06-13 NOTE — Progress Notes (Signed)
 Echocardiogram Echocardiogram Transesophageal has been performed.  Jacob Black 06/13/2024, 5:46 PM

## 2024-06-13 NOTE — Anesthesia Preprocedure Evaluation (Signed)
 Anesthesia Evaluation  Patient identified by MRN, date of birth, ID band Patient awake    Reviewed: Allergy & Precautions, NPO status , Patient's Chart, lab work & pertinent test results  History of Anesthesia Complications Negative for: history of anesthetic complications  Airway Mallampati: IV  TM Distance: >3 FB Neck ROM: Full    Dental  (+) Teeth Intact, Dental Advisory Given   Pulmonary Current Smoker   breath sounds clear to auscultation       Cardiovascular + CAD and + Past MI   Rhythm:Regular   1. Left ventricular ejection fraction, by estimation, is 50 to 55%. The  left ventricle has low normal function. Left ventricular endocardial  border not optimally defined to evaluate regional wall motion. Left  ventricular diastolic parameters were  normal.   2. Right ventricular systolic function is normal. The right ventricular  size is normal. Tricuspid regurgitation signal is inadequate for assessing  PA pressure.   3. The mitral valve is degenerative. No evidence of mitral valve  regurgitation. No evidence of mitral stenosis.   4. The aortic valve is abnormal. Aortic valve regurgitation is mild. No  aortic stenosis is present.   5. The inferior vena cava is normal in size with greater than 50%  respiratory variability, suggesting right atrial pressure of 3 mmHg.    Cardiac Catheterization 06/10/24: Hemodynamic data: LV 148/10, EDP 26 mmHg.  Ao 143/90, mean 114 mmHg.  No pressure gradient across aortic valve.   Angiographic data: LV: LVEF 45 to 50% with inferior hypokinesis to akinesis. Left Main: Distal left main 40% stenosis, hazy. LAD: Proximal/ostial LAD has a complex 99% stenosis.  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  D1 is very large with a ostial 80% stenosis.  Mid to distal LAD has a 40% lesion.  LAD gives collaterals to distal RCA. LCx: Gives origin to high OM1.  Ostial Cx has a 99%  stenosis and OM1 with ostial 99% stenosis and a proximal 80% stenosis.  Cx continues as a large OM 2 and is disease-free. RCA: Very large caliber vessel.  Mid segment has a ulcerated and mildly thrombotic 99% stenosis with TIMI I-II flow with a large PL branch and a large PDA.   Intervention data: Successful balloon angioplasty with establishing TIMI-3 flow into the mid RCA with a 3.0 x 15 mm emerge at 6 atmospheric pressure.  TIMI-3 flow was established with complete resolution of chest pain and ST elevations.  Intra-aortic balloon pump placed, patient started on heparin and cangrelor, needs CABG.     Neuro/Psych negative neurological ROS  negative psych ROS   GI/Hepatic negative GI ROS,,,(+) Hepatitis -, CTreated   Endo/Other    Renal/GU Lab Results      Component                Value               Date                      NA                       134 (L)             06/13/2024                K  4.1                 06/13/2024                CO2                      22                  06/13/2024                GLUCOSE                  104 (H)             06/13/2024                BUN                      17                  06/13/2024                CREATININE               1.25 (H)            06/13/2024                CALCIUM                  9.2                 06/13/2024                GFRNONAA                 >60                 06/13/2024                Musculoskeletal negative musculoskeletal ROS (+)    Abdominal   Peds  Hematology negative hematology ROS (+) Lab Results      Component                Value               Date                      WBC                      8.0                 06/13/2024                HGB                      13.6                06/13/2024                HCT                      40.5                06/13/2024                MCV  94.4                06/13/2024                PLT                       195                 06/13/2024              Anesthesia Other Findings   Reproductive/Obstetrics                              Anesthesia Physical Anesthesia Plan  ASA: 4  Anesthesia Plan: General   Post-op Pain Management:    Induction: Intravenous  PONV Risk Score and Plan: 2 and Ondansetron   Airway Management Planned: Oral ETT  Additional Equipment: Arterial line, CVP, PA Cath, TEE and Ultrasound Guidance Line Placement  Intra-op Plan:   Post-operative Plan: Post-operative intubation/ventilation  Informed Consent: I have reviewed the patients History and Physical, chart, labs and discussed the procedure including the risks, benefits and alternatives for the proposed anesthesia with the patient or authorized representative who has indicated his/her understanding and acceptance.     Dental advisory given  Plan Discussed with: CRNA  Anesthesia Plan Comments:         Anesthesia Quick Evaluation

## 2024-06-13 NOTE — Op Note (Signed)
 CARDIOVASCULAR SURGERY OPERATIVE NOTE  06/13/2024  Surgeon:  Dorise LOIS Fellers, MD  First Assistant: Con Bend,  PA-C:   An experienced assistant was required given the complexity of this surgery and the standard of surgical care. The assistant was needed for endoscopic vein harvest, exposure, dissection, suctioning, retraction of delicate tissues and sutures, instrument exchange and for overall help during this procedure.   Preoperative Diagnosis:  Severe multi-vessel coronary artery disease   Postoperative Diagnosis:  Same   Procedure:  Median Sternotomy Extracorporeal circulation 3.   Coronary artery bypass grafting x 4  Left internal mammary artery graft to the LAD SVG to diagonal SVG to OM1 SVG to PDA 4.   Endoscopic vein harvest from the both legs   Anesthesia:  General Endotracheal   Clinical History/Surgical Indication:  63 year old heavy smoker who presented with inferior STEMI due to 99% ulcerated and thrombotic appearing lesion in the large mid RCA that was treated with angioplasty reestablishing TIMI-3 flow with resolution of his chest pain and ST elevation. He also has tight lesions in the LAD/diagonal and LCX. IABP was inserted. He has done well since on heparin and cangrelor and IABP removed. I agree with need for CABG. Echo shows EF 50-55% with no valvular abnormality. I discussed the operative procedure with the patient including alternatives, benefits and risks; including but not limited to bleeding, blood transfusion, infection, stroke, myocardial infarction, graft failure, heart block requiring a permanent pacemaker, organ dysfunction, and death.  Jacob Black understands and agrees to proceed.     Preparation:  The patient was seen in the preoperative holding area and the correct patient, correct operation were confirmed with the patient after reviewing the  medical record and catheterization. The consent was signed by me. Preoperative antibiotics were given. A pulmonary arterial line and radial arterial line were placed by the anesthesia team. The patient was taken back to the operating room and positioned supine on the operating room table. After being placed under general endotracheal anesthesia by the anesthesia team a foley catheter was placed. The neck, chest, abdomen, and both legs were prepped with betadine soap and solution and draped in the usual sterile manner. A surgical time-out was taken and the correct patient and operative procedure were confirmed with the nursing and anesthesia staff.   Cardiopulmonary Bypass:  A median sternotomy was performed. The pericardium was opened in the midline. Right ventricular function appeared normal. The ascending aorta was of normal size and had no palpable plaque. There were no contraindications to aortic cannulation or cross-clamping. The patient was fully systemically heparinized and the ACT was maintained > 400 sec. The proximal aortic arch was cannulated with a 20 F aortic cannula for arterial inflow. Venous cannulation was performed via the right atrial appendage using a two-staged venous cannula. An antegrade cardioplegia/vent cannula was inserted into the mid-ascending aorta. Aortic occlusion was performed with a single cross-clamp. Systemic cooling to 32 degrees Centigrade and topical cooling of the heart with iced saline were used. Hyperkalemic antegrade cold blood cardioplegia was used to induce diastolic arrest and was then given at about 20 minute intervals throughout the period of arrest to maintain myocardial temperature at or below 10 degrees centigrade. A temperature probe was inserted into the interventricular septum and an insulating pad was placed in the pericardium.   Left internal mammary artery harvest:  The left side of the sternum was retracted using the Rultract retractor. The left  internal mammary artery was harvested as a pedicle  graft. All side branches were clipped. It was a large-sized vessel of good quality with excellent blood flow but was not very long due to his short sternum. It was ligated distally and divided. It was sprayed with topical papaverine solution to prevent vasospasm.   Endoscopic vein harvest: performed by Con Bend, PA-C  The right greater saphenous vein was harvested endoscopically through a 2 cm incision medial to the right knee. It was harvested from the thigh. It was a small to medium-sized vein of good quality but tiny below the knee. The side branches were all ligated with 4-0 silk ties.   The left greater saphenous vein was harvested endoscopically through a 2 cm incision medial to the left knee. It was harvested from the upper thigh to the knee. It was a small to medium-sized vein of good quality. The side branches were all ligated with 4-0 silk ties.   Coronary arteries:  The coronary arteries were examined.  LAD:  large vessel with no distal disease. The diagonal was a medium caliber vessel that has segmental distal disease.  LCX:  Large OM1 visible proximally where it was free of disease and then became intramyocardial. The OM2 was small and not graftable where I could see it. It looked larger proximally on the cath but this larger part is really the LCX which is in the AV groove and could not be exposed. RCA:  diffusely diseased to just before the PDA origin. The proximal PDA was a large vessel. The PDA and PL had no significant disease.   Grafts: Assisted by Con Bend, PA-C  LIMA to the LAD: 2.5 mm. It was sewn end to side using 8-0 prolene continuous suture. SVG to diagonal:  1.6 mm. It was sewn end to side using 7-0 prolene continuous suture. SVG to OM1:  1.75 mm. It was sewn end to side using 7-0 prolene continuous suture. SVG to PDA:  1.75 mm. It was sewn end to side using 7-0 prolene continuous suture.  The  proximal vein graft anastomoses were performed to the mid-ascending aorta using continuous 6-0 prolene suture. Graft markers were placed around the proximal anastomoses.   Completion:  The patient was rewarmed to 37 degrees Centigrade. The clamp was removed from the LIMA pedicle and there was rapid warming of the septum and return of ventricular fibrillation. The crossclamp was removed with a time of 91 minutes. There was spontaneous return of sinus rhythm. The distal and proximal anastomoses were checked for hemostasis. The position of the grafts was satisfactory. Two temporary epicardial pacing wires were placed on the right atrium and two on the right ventricle. The patient was weaned from CPB without difficulty on no inotropes. CPB time was 106 minutes. Cardiac output was 5 LPM. TEE showed normal LV systolic function.  Heparin was fully reversed with protamine and the aortic and venous cannulas removed. Hemostasis was achieved. Mediastinal and left pleural drainage tubes were placed. The sternum was closed with  #6 stainless steel wires. The fascia was closed with continuous # 1 vicryl suture. The subcutaneous tissue was closed with 2-0 vicryl continuous suture. The skin was closed with 3-0 vicryl subcuticular suture. All sponge, needle, and instrument counts were reported correct at the end of the case. Dry sterile dressings were placed over the incisions and around the chest tubes which were connected to pleurevac suction. The patient was then transported to the surgical intensive care unit in stable condition.

## 2024-06-13 NOTE — Anesthesia Procedure Notes (Signed)
 Central Venous Catheter Insertion Performed by: Leopoldo Bruckner, MD, anesthesiologist Start/End10/07/2024 8:08 AM, 06/13/2024 8:18 AM Patient location: OR. Preanesthetic checklist: patient identified, IV checked, site marked, risks and benefits discussed, surgical consent, monitors and equipment checked, pre-op evaluation, timeout performed and anesthesia consent Hand hygiene performed  and maximum sterile barriers used  Catheter size: 9 Fr MAC introducer Procedure performed using ultrasound to evaluate access site. Ultrasound Notes:relevant anatomy identified, ultrasound used to visualize needle entry, vessel patent under ultrasound and image(s) printed for medical record. Attempts: 1 Following insertion, line sutured and dressing applied. Post procedure assessment: blood return through all ports, free fluid flow and no air  Patient tolerated the procedure well with no immediate complications.

## 2024-06-13 NOTE — Anesthesia Procedure Notes (Signed)
 Central Venous Catheter Insertion Performed by: Leopoldo Bruckner, MD, anesthesiologist Start/End10/07/2024 8:08 AM, 06/13/2024 8:18 AM Patient location: OR. Preanesthetic checklist: patient identified, IV checked, site marked, risks and benefits discussed, surgical consent, monitors and equipment checked, pre-op evaluation, timeout performed and anesthesia consent Hand hygiene performed  and maximum sterile barriers used  PA cath was placed.Swan type:thermodilution Attempts: 1 Patient tolerated the procedure well with no immediate complications.

## 2024-06-13 NOTE — Interval H&P Note (Signed)
 History and Physical Interval Note:  06/13/2024 5:07 AM  Jacob Black  has presented today for surgery, with the diagnosis of CAD STEMI.  The various methods of treatment have been discussed with the patient and family. After consideration of risks, benefits and other options for treatment, the patient has consented to  Procedure(s): CORONARY ARTERY BYPASS GRAFTING (CABG) (N/A) ECHOCARDIOGRAM, TRANSESOPHAGEAL, INTRAOPERATIVE (N/A) as a surgical intervention.  The patient's history has been reviewed, patient examined, no change in status, stable for surgery.  I have reviewed the patient's chart and labs.  Questions were answered to the patient's satisfaction.     Sady Monaco K Zayli Villafuerte

## 2024-06-13 NOTE — Progress Notes (Signed)
 Pt is stable hemodynamically, afebrile, no acute distress. He is able to rest well with no major complaints. Plan of care is reviewed. Pt will undergo CABG this am. Pre-op prep done per protocol. We will monitor.  Wendi Dash, RN

## 2024-06-14 ENCOUNTER — Inpatient Hospital Stay (HOSPITAL_COMMUNITY)

## 2024-06-14 DIAGNOSIS — I2119 ST elevation (STEMI) myocardial infarction involving other coronary artery of inferior wall: Secondary | ICD-10-CM | POA: Diagnosis not present

## 2024-06-14 DIAGNOSIS — Z951 Presence of aortocoronary bypass graft: Secondary | ICD-10-CM

## 2024-06-14 LAB — CBC
HCT: 33.8 % — ABNORMAL LOW (ref 39.0–52.0)
HCT: 34.8 % — ABNORMAL LOW (ref 39.0–52.0)
Hemoglobin: 11.2 g/dL — ABNORMAL LOW (ref 13.0–17.0)
Hemoglobin: 11.5 g/dL — ABNORMAL LOW (ref 13.0–17.0)
MCH: 32.1 pg (ref 26.0–34.0)
MCH: 32.1 pg (ref 26.0–34.0)
MCHC: 33.1 g/dL (ref 30.0–36.0)
MCHC: 33 g/dL (ref 30.0–36.0)
MCV: 96.8 fL (ref 80.0–100.0)
MCV: 97.2 fL (ref 80.0–100.0)
Platelets: 161 K/uL (ref 150–400)
Platelets: 167 K/uL (ref 150–400)
RBC: 3.49 MIL/uL — ABNORMAL LOW (ref 4.22–5.81)
RBC: 3.58 MIL/uL — ABNORMAL LOW (ref 4.22–5.81)
RDW: 13 % (ref 11.5–15.5)
RDW: 13 % (ref 11.5–15.5)
WBC: 10.9 K/uL — ABNORMAL HIGH (ref 4.0–10.5)
WBC: 12.9 K/uL — ABNORMAL HIGH (ref 4.0–10.5)
nRBC: 0 % (ref 0.0–0.2)
nRBC: 0 % (ref 0.0–0.2)

## 2024-06-14 LAB — BASIC METABOLIC PANEL WITH GFR
Anion gap: 8 (ref 5–15)
Anion gap: 10 (ref 5–15)
BUN: 12 mg/dL (ref 6–20)
BUN: 13 mg/dL (ref 6–20)
CO2: 21 mmol/L — ABNORMAL LOW (ref 22–32)
CO2: 23 mmol/L (ref 22–32)
Calcium: 7.8 mg/dL — ABNORMAL LOW (ref 8.9–10.3)
Calcium: 8.3 mg/dL — ABNORMAL LOW (ref 8.9–10.3)
Chloride: 102 mmol/L (ref 98–111)
Chloride: 97 mmol/L — ABNORMAL LOW (ref 98–111)
Creatinine, Ser: 1.1 mg/dL (ref 0.61–1.24)
Creatinine, Ser: 1.27 mg/dL — ABNORMAL HIGH (ref 0.61–1.24)
GFR, Estimated: >60 mL/min (ref >60)
GFR, Estimated: >60 mL/min (ref >60)
Glucose, Bld: 111 mg/dL — ABNORMAL HIGH (ref 70–99)
Glucose, Bld: 119 mg/dL — ABNORMAL HIGH (ref 70–99)
Potassium: 4.1 mmol/L (ref 3.5–5.1)
Potassium: 4 mmol/L (ref 3.5–5.1)
Sodium: 131 mmol/L — ABNORMAL LOW (ref 135–145)
Sodium: 130 mmol/L — ABNORMAL LOW (ref 135–145)

## 2024-06-14 LAB — GLUCOSE, CAPILLARY
Glucose-Capillary: 121 mg/dL — ABNORMAL HIGH (ref 70–99)
Glucose-Capillary: 125 mg/dL — ABNORMAL HIGH (ref 70–99)
Glucose-Capillary: 111 mg/dL — ABNORMAL HIGH (ref 70–99)
Glucose-Capillary: 124 mg/dL — ABNORMAL HIGH (ref 70–99)
Glucose-Capillary: 139 mg/dL — ABNORMAL HIGH (ref 70–99)
Glucose-Capillary: 100 mg/dL — ABNORMAL HIGH (ref 70–99)
Glucose-Capillary: 90 mg/dL (ref 70–99)

## 2024-06-14 LAB — MAGNESIUM
Magnesium: 2.2 mg/dL (ref 1.7–2.4)
Magnesium: 2.2 mg/dL (ref 1.7–2.4)

## 2024-06-14 MED ORDER — INSULIN ASPART 100 UNIT/ML IJ SOLN
0.0000 [IU] | INTRAMUSCULAR | Status: DC
  Administered 2024-06-14: 2 [IU] via SUBCUTANEOUS

## 2024-06-14 MED ORDER — ENOXAPARIN SODIUM 40 MG/0.4ML IJ SOSY
40.0000 mg | PREFILLED_SYRINGE | Freq: Every day | INTRAMUSCULAR | Status: DC
  Administered 2024-06-14 – 2024-06-17 (×4): 40 mg via SUBCUTANEOUS
  Filled 2024-06-14 (×4): qty 0.4

## 2024-06-14 MED ORDER — METOPROLOL TARTRATE 25 MG PO TABS
25.0000 mg | ORAL_TABLET | Freq: Two times a day (BID) | ORAL | Status: DC
  Administered 2024-06-14: 25 mg via ORAL
  Filled 2024-06-14: qty 1

## 2024-06-14 MED ORDER — FENTANYL CITRATE (PF) 50 MCG/ML IJ SOSY
25.0000 ug | PREFILLED_SYRINGE | Freq: Once | INTRAMUSCULAR | Status: AC
  Administered 2024-06-14: 25 ug via INTRAVENOUS
  Filled 2024-06-14: qty 1

## 2024-06-14 MED ORDER — METOPROLOL TARTRATE 25 MG/10 ML ORAL SUSPENSION: 25.0000 mg | Freq: Two times a day (BID) | ORAL | Status: DC

## 2024-06-14 MED ORDER — POTASSIUM CHLORIDE CRYS ER 20 MEQ PO TBCR
20.0000 meq | EXTENDED_RELEASE_TABLET | Freq: Two times a day (BID) | ORAL | Status: DC
  Administered 2024-06-14 (×2): 20 meq via ORAL
  Filled 2024-06-14 (×2): qty 1

## 2024-06-14 MED ORDER — FUROSEMIDE 10 MG/ML IJ SOLN
40.0000 mg | Freq: Two times a day (BID) | INTRAMUSCULAR | Status: AC
  Administered 2024-06-14 (×2): 40 mg via INTRAVENOUS
  Filled 2024-06-14 (×2): qty 4

## 2024-06-14 NOTE — Progress Notes (Signed)
 1 Day Post-Op Procedure(s) (LRB): CORONARY ARTERY BYPASS GRAFTING (CABG) X FOUR USING LEFT INTERNAL MAMMARY ARTERY AND BILATERAL GREATER SAPHENOUS VEIN HARVESTED ENDOSCOPICALLY (N/A) ECHOCARDIOGRAM, TRANSESOPHAGEAL, INTRAOPERATIVE (N/A) Subjective: No complaints  Objective: Vital signs in last 24 hours: Temp:  [96.8 F (36 C)-100 F (37.8 C)] 97.9 F (36.6 C) (10/12 0900) Pulse Rate:  [79-89] 80 (10/12 0900) Cardiac Rhythm: A-V Sequential paced (10/12 0800) Resp:  [0-34] 15 (10/12 0900) BP: (91-141)/(62-79) 141/79 (10/12 0900) SpO2:  [91 %-100 %] 95 % (10/12 0900) Arterial Line BP: (95-280)/(51-272) 130/67 (10/12 0900) FiO2 (%):  [40 %-50 %] 40 % (10/11 1817) Weight:  [96 kg] 96 kg (10/12 0500)  Hemodynamic parameters for last 24 hours: PAP: (19-47)/(9-30) 36/18 CO:  [4.4 L/min-6.5 L/min] 5.1 L/min CI:  [2.1 L/min/m2-3.2 L/min/m2] 2.5 L/min/m2  Intake/Output from previous day: 10/11 0701 - 10/12 0700 In: 6566.2 [I.V.:4273.8; Blood:235; NG/GT:110; IV Piggyback:1947.4] Out: 4963 [Urine:3870; Blood:650; Chest Tube:443] Intake/Output this shift: Total I/O In: 80.9 [I.V.:80.9] Out: 110 [Urine:100; Chest Tube:10]  General appearance: alert and cooperative Neurologic: intact Heart: regular rate and rhythm Lungs: clear to auscultation bilaterally Extremities: edema mild Wound: dressings dry  Lab Results: Recent Labs    06/13/24 2006 06/13/24 2007 06/14/24 0443  WBC 10.2  --  10.9*  HGB 11.2* 10.5* 11.2*  HCT 33.8* 31.0* 33.8*  PLT 148*  --  161   BMET:  Recent Labs    06/13/24 2006 06/13/24 2007 06/14/24 0443  NA 136 137 131*  K 4.4 4.5 4.1  CL 105  --  102  CO2 19*  --  21*  GLUCOSE 109*  --  111*  BUN 13  --  12  CREATININE 1.09  --  1.10  CALCIUM 7.8*  --  7.8*    PT/INR:  Recent Labs    06/13/24 1447  LABPROT 17.4*  INR 1.4*   ABG    Component Value Date/Time   PHART 7.367 06/13/2024 2007   HCO3 21.0 06/13/2024 2007   TCO2 22 06/13/2024 2007    ACIDBASEDEF 4.0 (H) 06/13/2024 2007   O2SAT 97 06/13/2024 2007   CBG (last 3)  Recent Labs    06/14/24 0304 06/14/24 0654 06/14/24 0910  GLUCAP 125* 111* 124*   CXR: bibasilar atelectasis  ECG: NSR, no acute changes  Assessment/Plan: S/P Procedure(s) (LRB): CORONARY ARTERY BYPASS GRAFTING (CABG) X FOUR USING LEFT INTERNAL MAMMARY ARTERY AND BILATERAL GREATER SAPHENOUS VEIN HARVESTED ENDOSCOPICALLY (N/A) ECHOCARDIOGRAM, TRANSESOPHAGEAL, INTRAOPERATIVE (N/A)  POD 1  Hemodynamically stable in NSR. Increase Lopressor to 25 bid.  Wt is about 13.5 lbs over preop. Start diuresis.  Glucose under good control. Preop Hgb A1c 5.2 on no meds. Transition to SSI.  DC pacing wires and then chest tubes 2 hrs after wires if stable.  DC swan, arterial line.  IS, OOB, mobilize.  Plan to start Plavix in a couple days in addition to ASA for preop STEMI.   LOS: 4 days    Jacob Black 06/14/2024

## 2024-06-14 NOTE — Progress Notes (Signed)
 Advanced Heart Failure Rounding Note  Cardiologist: Gordy Bergamo, MD  Chief Complaint: STEMI Subjective:    POD #1 s/p CABG with Dr. Lucas. Having some chest wall pain, but otherwise doing well. Hoping to watch the Cowboys game later.   Objective:    Weight Range: 96 kg Body mass index is 31.25 kg/m.   Vital Signs:   Temp:  [96.8 F (36 C)-100 F (37.8 C)] 97.9 F (36.6 C) (10/12 0900) Pulse Rate:  [79-89] 80 (10/12 0900) Resp:  [0-34] 15 (10/12 0900) BP: (91-141)/(62-79) 141/79 (10/12 0900) SpO2:  [91 %-100 %] 95 % (10/12 0900) Arterial Line BP: (95-280)/(51-272) 130/67 (10/12 0900) FiO2 (%):  [40 %-50 %] 40 % (10/11 1817) Weight:  [96 kg] 96 kg (10/12 0500) Last BM Date : 06/12/24  Weight change: Filed Weights   06/10/24 0115 06/13/24 0435 06/14/24 0500  Weight: 91.9 kg 89.8 kg 96 kg   Intake/Output:  Intake/Output Summary (Last 24 hours) at 06/14/2024 0959 Last data filed at 06/14/2024 0900 Gross per 24 hour  Intake 4347.11 ml  Output 4623 ml  Net -275.89 ml   Physical Exam   GENERAL: NAD, well appearing PULM:  Normal work of breathing, CTAB CARDIAC:  JVP: flat         Normal rate with regular rhythm. Chest wall incision, dressing on, CT in place.  No edema. Warm and well perfused extremities. ABDOMEN: Soft, non-tender, non-distended. NEUROLOGIC: Patient is oriented x3 with no focal or lateralizing neurologic deficits.    Telemetry   Sinus in the 80s  Labs    CBC Recent Labs    06/13/24 2006 06/13/24 2007 06/14/24 0443  WBC 10.2  --  10.9*  HGB 11.2* 10.5* 11.2*  HCT 33.8* 31.0* 33.8*  MCV 96.3  --  96.8  PLT 148*  --  161   Basic Metabolic Panel Recent Labs    89/88/74 2006 06/13/24 2007 06/14/24 0443  NA 136 137 131*  K 4.4 4.5 4.1  CL 105  --  102  CO2 19*  --  21*  GLUCOSE 109*  --  111*  BUN 13  --  12  CREATININE 1.09  --  1.10  CALCIUM 7.8*  --  7.8*  MG 3.1*  --  2.2   Hemoglobin A1C No results for input(s):  HGBA1C in the last 72 hours.  Fasting Lipid Panel No results for input(s): CHOL, HDL, LDLCALC, TRIG, CHOLHDL, LDLDIRECT in the last 72 hours.  Thyroid Function Tests No results for input(s): TSH, T4TOTAL, T3FREE, THYROIDAB in the last 72 hours.  Invalid input(s): FREET3  Medications:    Scheduled Medications:  acetaminophen   1,000 mg Oral Q6H   Or   acetaminophen  (TYLENOL ) oral liquid 160 mg/5 mL  1,000 mg Per Tube Q6H   allopurinol  300 mg Oral Daily   aspirin EC  325 mg Oral Daily   Or   aspirin  324 mg Per Tube Daily   bisacodyl  10 mg Oral Daily   Or   bisacodyl  10 mg Rectal Daily   Chlorhexidine Gluconate Cloth  6 each Topical Daily   docusate sodium  200 mg Oral Daily   enoxaparin  (LOVENOX ) injection  40 mg Subcutaneous QHS   fentaNYL (SUBLIMAZE) injection  25 mcg Intravenous Once   folic acid  1 mg Oral Daily   furosemide  40 mg Intravenous BID   insulin aspart  0-24 Units Subcutaneous Q4H   metoCLOPramide (REGLAN) injection  10 mg Intravenous  Q6H   metoprolol tartrate  25 mg Oral BID   Or   metoprolol tartrate  25 mg Per Tube BID   multivitamin with minerals  1 tablet Oral Daily   [START ON 06/15/2024] pantoprazole  40 mg Oral Daily   pantoprazole (PROTONIX) IV  40 mg Intravenous QHS   potassium chloride  20 mEq Oral BID   rosuvastatin  40 mg Oral Daily   sodium chloride  flush  3 mL Intravenous Q12H   thiamine  100 mg Oral Daily   Or   thiamine  100 mg Intravenous Daily    Infusions:  sodium chloride  10 mL/hr at 06/14/24 0900   sodium chloride      sodium chloride  20 mL/hr at 06/14/24 0900    ceFAZolin (ANCEF) IV Stopped (06/14/24 0536)   lactated ringers 10 mL/hr at 06/14/24 0900   lactated ringers 20 mL/hr at 06/14/24 0900    PRN Medications: sodium chloride , metoprolol tartrate, morphine injection, ondansetron  (ZOFRAN ) IV, oxyCODONE, sodium chloride  flush, traMADol   Patient Profile   SHAUNN TACKITT is a 63 y.o. male with  hx ETOH abuse, hypercholesteremia and gout. Now admitted with STEMI.   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 S/p [place] Angioplasty to RCA and IABP placement - IABP removed 06/10/24. - Given POBA kept on IV cangrelor until CABG - S/p 4V CABG on 10/11 - Continue aspirin, will eventually need plavix for NSTEMI on presentation - Echo EF 50-55% - 40mg  IV lasix x2 given   HTN  - elevated today, though having some pain. Started metoprolol today   Tobacco Abuse - Discussed cessation  ETOH abuse - monitor for CIWA needs, does not appear to need at this time.  - cessation advised.   Appears stable from a cardiac standpoint. Normal filling pressures and index on no support. HF will sign off at this time. Needs plavix prior to discharge. General cardiology follow up to be arranged.   Length of Stay: 4  Morene JINNY Brownie, MD  06/14/2024, 9:59 AM  Advanced Heart Failure Team Pager [phone] (M-F; 7a - 5p)  Please contact [facility] for night-coverage after hours (5p -7a ) and weekends on [email or web address]

## 2024-06-14 NOTE — Progress Notes (Signed)
 Patient ID: Jacob Black, male   DOB: 09/01/1961, 63 y.o.   MRN: [id number]  TCTS Evening Rounds:  Hemodynamically stable in NSR.  Diuresing well.  Pacing wires and CT's out.

## 2024-06-14 NOTE — Discharge Instructions (Signed)

## 2024-06-15 ENCOUNTER — Encounter (HOSPITAL_COMMUNITY): Payer: Self-pay | Admitting: Surgery

## 2024-06-15 ENCOUNTER — Inpatient Hospital Stay (HOSPITAL_COMMUNITY)

## 2024-06-15 LAB — BASIC METABOLIC PANEL WITH GFR
Anion gap: 9 (ref 5–15)
BUN: 15 mg/dL (ref 6–20)
CO2: 24 mmol/L (ref 22–32)
Calcium: 8.4 mg/dL — ABNORMAL LOW (ref 8.9–10.3)
Chloride: 97 mmol/L — ABNORMAL LOW (ref 98–111)
Creatinine, Ser: 1.26 mg/dL — ABNORMAL HIGH (ref 0.61–1.24)
GFR, Estimated: >60 mL/min (ref >60)
Glucose, Bld: 100 mg/dL — ABNORMAL HIGH (ref 70–99)
Potassium: 4.4 mmol/L (ref 3.5–5.1)
Sodium: 130 mmol/L — ABNORMAL LOW (ref 135–145)

## 2024-06-15 LAB — CBC
HCT: 32.7 % — ABNORMAL LOW (ref 39.0–52.0)
Hemoglobin: 11 g/dL — ABNORMAL LOW (ref 13.0–17.0)
MCH: 32.4 pg (ref 26.0–34.0)
MCHC: 33.6 g/dL (ref 30.0–36.0)
MCV: 96.2 fL (ref 80.0–100.0)
Platelets: 161 K/uL (ref 150–400)
RBC: 3.4 MIL/uL — ABNORMAL LOW (ref 4.22–5.81)
RDW: 13.1 % (ref 11.5–15.5)
WBC: 13.6 K/uL — ABNORMAL HIGH (ref 4.0–10.5)
nRBC: 0 % (ref 0.0–0.2)

## 2024-06-15 LAB — GLUCOSE, CAPILLARY
Glucose-Capillary: 101 mg/dL — ABNORMAL HIGH (ref 70–99)
Glucose-Capillary: 106 mg/dL — ABNORMAL HIGH (ref 70–99)
Glucose-Capillary: 119 mg/dL — ABNORMAL HIGH (ref 70–99)
Glucose-Capillary: 120 mg/dL — ABNORMAL HIGH (ref 70–99)

## 2024-06-15 MED ORDER — ASPIRIN 81 MG PO TBEC
81.0000 mg | DELAYED_RELEASE_TABLET | Freq: Every day | ORAL | Status: DC
  Administered 2024-06-15 – 2024-06-18 (×4): 81 mg via ORAL
  Filled 2024-06-15 (×4): qty 1

## 2024-06-15 MED ORDER — SODIUM CHLORIDE 0.9% FLUSH
3.0000 mL | Freq: Two times a day (BID) | INTRAVENOUS | Status: DC
  Administered 2024-06-15 – 2024-06-18 (×7): 3 mL via INTRAVENOUS

## 2024-06-15 MED ORDER — METOPROLOL TARTRATE 25 MG PO TABS
25.0000 mg | ORAL_TABLET | Freq: Two times a day (BID) | ORAL | Status: DC
  Administered 2024-06-15 – 2024-06-18 (×7): 25 mg via ORAL
  Filled 2024-06-15 (×7): qty 1

## 2024-06-15 MED ORDER — ONDANSETRON HCL 4 MG/2ML IJ SOLN: 4.0000 mg | Freq: Three times a day (TID) | INTRAMUSCULAR | Status: DC | PRN

## 2024-06-15 MED ORDER — SODIUM CHLORIDE 0.9% FLUSH: 3.0000 mL | INTRAVENOUS | Status: DC | PRN

## 2024-06-15 MED ORDER — TORSEMIDE 20 MG PO TABS
20.0000 mg | ORAL_TABLET | Freq: Every day | ORAL | Status: DC
Start: 2024-06-15 — End: 2024-06-17
  Administered 2024-06-15: 20 mg via ORAL
  Filled 2024-06-15 (×2): qty 1

## 2024-06-15 MED ORDER — POTASSIUM CHLORIDE CRYS ER 20 MEQ PO TBCR
20.0000 meq | EXTENDED_RELEASE_TABLET | Freq: Every day | ORAL | Status: AC
  Administered 2024-06-15 – 2024-06-16 (×2): 20 meq via ORAL
  Filled 2024-06-15 (×2): qty 1

## 2024-06-15 MED ORDER — ~~LOC~~ CARDIAC SURGERY, PATIENT & FAMILY EDUCATION: Freq: Once | Status: AC

## 2024-06-15 MED ORDER — CLOPIDOGREL BISULFATE 75 MG PO TABS
75.0000 mg | ORAL_TABLET | Freq: Every day | ORAL | Status: DC
Start: 2024-06-15 — End: 2024-06-18
  Administered 2024-06-15 – 2024-06-18 (×4): 75 mg via ORAL
  Filled 2024-06-15 (×4): qty 1

## 2024-06-15 MED ORDER — SODIUM CHLORIDE 0.9 % IV SOLN: 250.0000 mL | INTRAVENOUS | Status: AC | PRN

## 2024-06-15 MED FILL — Thrombin (Recombinant) For Soln 20000 Unit: CUTANEOUS | Qty: 1 | Status: AC

## 2024-06-15 NOTE — Progress Notes (Signed)
 Pt reports he just ambulated short distance in hall with RW. C/o not knowing how to get out of bed and experiencing pain. Discussed log roll and sternal precautions with good reception. Wife present. Encouraged another walk this pm and IS.  [id number] Aliene Aris BS, ACSM-CEP 06/15/2024 2:38 PM

## 2024-06-15 NOTE — Progress Notes (Signed)
 Pt arrived to unit from 2 heart VSS, A/O x 4,  CCMD called ,CHG given, pt oriented to unit,Will continue to monitor.   Jacob POUR Ellenore Roscoe, RN    06/15/24 1208  Vitals  Temp 97.9 F (36.6 C)  Temp Source Oral  BP 116/66  MAP (mmHg) 80  BP Location Right Arm  BP Method Automatic  Patient Position (if appropriate) Lying  Pulse Rate 68  Pulse Rate Source Monitor  ECG Heart Rate 66  Resp (!) 22  Level of Consciousness  Level of Consciousness Alert  Oxygen  Therapy  SpO2 96 %  O2 Device Nasal Cannula  O2 Flow Rate (L/min) 2 L/min  Pain Assessment  Pain Scale 0-10 (scheduled)  Pain Score 9  MEWS Score  MEWS Temp 0  MEWS Systolic 0  MEWS Pulse 0  MEWS RR 1  MEWS LOC 0  MEWS Score 1  MEWS Score Color Green

## 2024-06-15 NOTE — Progress Notes (Signed)
 2 Days Post-Op Procedure(s) (LRB): CORONARY ARTERY BYPASS GRAFTING (CABG) X FOUR USING LEFT INTERNAL MAMMARY ARTERY AND BILATERAL GREATER SAPHENOUS VEIN HARVESTED ENDOSCOPICALLY (N/A) ECHOCARDIOGRAM, TRANSESOPHAGEAL, INTRAOPERATIVE (N/A) Subjective: Some chest wall pain but slept well, ambulated this am.  Objective: Vital signs in last 24 hours: Temp:  [97.7 F (36.5 C)-98.7 F (37.1 C)] 98.1 F (36.7 C) (10/13 0420) Pulse Rate:  [61-80] 75 (10/13 0600) Cardiac Rhythm: Normal sinus rhythm (10/13 0400) Resp:  [12-29] 20 (10/13 0600) BP: (109-149)/(62-91) 122/81 (10/13 0600) SpO2:  [89 %-97 %] 89 % (10/13 0600) Arterial Line BP: (113-149)/(62-75) 149/75 (10/12 1100) Weight:  [92.3 kg] 92.3 kg (10/13 0500)  Hemodynamic parameters for last 24 hours: PAP: (36-39)/(18-20) 36/18  Intake/Output from previous day: 10/12 0701 - 10/13 0700 In: 543.7 [P.O.:100; I.V.:143.7; IV Piggyback:300] Out: 2708 [Urine:2588; Chest Tube:120] Intake/Output this shift: No intake/output data recorded.  General appearance: alert and cooperative Neurologic: intact Heart: regular rate and rhythm Lungs: clear to auscultation bilaterally Extremities: minimal edema Wound: incision healing well  Lab Results: Recent Labs    06/14/24 1658 06/15/24 0421  WBC 12.9* 13.6*  HGB 11.5* 11.0*  HCT 34.8* 32.7*  PLT 167 161   BMET:  Recent Labs    06/14/24 1658 06/15/24 0421  NA 130* 130*  K 4.0 4.4  CL 97* 97*  CO2 23 24  GLUCOSE 119* 100*  BUN 13 15  CREATININE 1.27* 1.26*  CALCIUM 8.3* 8.4*    PT/INR:  Recent Labs    06/13/24 1447  LABPROT 17.4*  INR 1.4*   ABG    Component Value Date/Time   PHART 7.367 06/13/2024 2007   HCO3 21.0 06/13/2024 2007   TCO2 22 06/13/2024 2007   ACIDBASEDEF 4.0 (H) 06/13/2024 2007   O2SAT 97 06/13/2024 2007   CBG (last 3)  Recent Labs    06/14/24 1947 06/14/24 2359 06/15/24 0426  GLUCAP 90 106* 101*   CXR: mild bibasilar  atelectasis  Assessment/Plan: S/P Procedure(s) (LRB): CORONARY ARTERY BYPASS GRAFTING (CABG) X FOUR USING LEFT INTERNAL MAMMARY ARTERY AND BILATERAL GREATER SAPHENOUS VEIN HARVESTED ENDOSCOPICALLY (N/A) ECHOCARDIOGRAM, TRANSESOPHAGEAL, INTRAOPERATIVE (N/A)  POD 2 Hemodynamically stable in NSR. Continue Lopressor.  Preop STEMI aborted with PTCA. ASA 81 and Plavix 75.  -2164 cc yesterday. Wt down 8 lbs from yesterday. He is about 5 lbs over preop. Will use Demedex 20 mg for two days.  DC sleeve and foley.  Transfer to 4E. Continue IS, ambulation.   LOS: 5 days    Dorise MARLA Fellers 06/15/2024

## 2024-06-15 NOTE — Discharge Summary (Signed)
 [address] [place] [address]             [phone]        Physician Discharge Summary  Patient ID: Jacob Black MRN: [id number] DOB/AGE: 09-30-1960 63 y.o.  Admit date: 06/09/2024 Discharge date: 06/18/2024  Admission Diagnoses:  Patient Active Problem List   Diagnosis Date Noted   Acute inferior myocardial infarction [facility]) 06/10/2024   Triple vessel coronary artery disease 06/10/2024   Acute ST elevation myocardial infarction (STEMI) of inferior wall (HCC) 06/09/2024   Hypercholesteremia 06/09/2024   Chronic hepatitis C without hepatic coma (HCC) 08/10/2015   Gout 08/10/2015   Discharge Diagnoses:  Patient Active Problem List   Diagnosis Date Noted   S/P CABG x 4 06/13/2024   Acute inferior myocardial infarction (HCC) 06/10/2024   Triple vessel coronary artery disease 06/10/2024   Acute ST elevation myocardial infarction (STEMI) of inferior wall (HCC) 06/09/2024   Hypercholesteremia 06/09/2024   Chronic hepatitis C without hepatic coma (HCC) 08/10/2015   Gout 08/10/2015   Discharged Condition: stable  History of Present Illness:     Jacob Black is a 63 year old male with a past medical history of hyperlipidemia, alcohol abuse (drinks about 3 liquor drinks per day), tobacco abuse (about 25 pack year history), gout (recent flare in right heel) and chronic hepatitis C. The patient developed central non radiating chest pain on 10/07 as well as nausea without vomiting, shortness of breath and palpitations. He denies diaphoresis, LOC, and cough. He called EMS who activated code STEMI and brought him to the ED, he received 324mg  ASA and 2 sublingual nitroglycerin tablets en route. EKG showed tombstone ST elevation in the inferior leads and he had ongoing chest discomfort upon arrival to the ED. He was emergently brought to the cath lab, he underwent successful percutaneous balloon angioplasty of his RCA with TIMI-3 flow established which  was the culprit for his STEMI and IABP was placed. He was transferred to the ICU with resolution of his chest pain and ST elevation. 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 He has been started on IV Heparin and Cangrelor. Echocardiogram on 10/08 showed LVEF 50-55%, low normal left ventricle function, degenerative mitral valve without evidence of stenosis or regurgitation, and mild aortic valve regurgitation.    He lives with his wife in a home with about 5 steps per level. 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 He reports he was still going to work and walking many steps in the [employer] [employer] prior to this but did notice overall fatigue and shortness of breath with stairs that worsened about 6-51months ago. He also reports lock ups of chest pain that wrap around his ribs and chest for the last year or so. He currently denies chest pain. He also reports a family history of heart attacks in both parents. His dad died of an MI and stroke at 90 and his mom died of CHF at 60.   Dr. Lucas reviewed the patient's diagnostic studies and determined he would benefit from surgical intervention. He reviewed the treatment options as well as the risks and benefits of surgery. Mr. Jacob Black was agreeable to proceed with surgery.  Hospital Course:  The patient was admitted to [facility] on 06-20-24, IABP was weaned and removed that evening without complication. He remained on IV Cangrelor and IV heparin. He remained stable and was brought to the operating room on 06/13/24. He underwent CABG x 4 utilizing LIMA to LAD, SVG to OM, SVG to PDA, and SVG to diagonal as well as endoscopic harvest of the left greater saphenous vein and right greater  saphenous vein of the thigh. He tolerated that procedure well and was transferred to the SICU in stable condition. He was extubated without complication the evening of surgery. Swan ganz catheter and arterial line were removed on POD1 without complication. Epicardial pacing wires and chest tubes were also removed on POD1. Plavix was started for hx of STEMI. Lopressor was started and titrated as able. He was routinely diuresed. He was felt stable for transfer to the progressive unit on POD2. He developed wheezing and was started on Duoneb and aggressive pulmonary hygiene. PT/OT evaluations recommended home health PT/OT.He had a large bowel movement 10/15 and he has been tolerating a diet. He was ambulating well on RA. His incisions were healing well without sign of infection. His creatinine was slightly elevated post op (1.5 on 10/16) which was likely secondary to diuresis (Torsemide). He was almost at his pre op weight and as discussed with Dr. Lucas, diuresis was stopped 10/16. He is felt stable for discharge home.   Consults: None  Significant Diagnostic Studies: Cardiac Catheterization 06-20-24: Hemodynamic data: LV 148/10, EDP 26 mmHg.  Ao 143/90, mean 114 mmHg.  No pressure gradient across aortic valve.   Angiographic data: LV: LVEF 45 to 50% with inferior hypokinesis to akinesis. Left Main: Distal left main 40% stenosis, hazy. LAD: Proximal/ostial LAD has a complex 99% stenosis.  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  D1 is very large with a ostial 80% stenosis.  Mid to distal LAD has a 40% lesion.  LAD gives collaterals to distal RCA. LCx: Gives origin to high OM1.  Ostial Cx has a 99% stenosis and OM1 with ostial 99% stenosis and a proximal 80% stenosis.  Cx continues as a large OM 2 and is disease-free. RCA: Very large caliber vessel.  Mid segment has a ulcerated and mildly thrombotic 99% stenosis with TIMI I-II flow with a large PL branch and a large PDA.   Intervention  data: Successful balloon angioplasty with establishing TIMI-3 flow into the mid RCA with a 3.0 x 15 mm emerge at 6 atmospheric pressure.  TIMI-3 flow was established with complete resolution of chest pain and ST elevations.  Intra-aortic balloon pump placed, patient started on heparin and cangrelor, needs CABG.        ECHOCARDIOGRAM REPORT       Patient Name:   Jacob Black Date of Exam: 2024/06/20  Medical Rec #:  [id number]        Height:       68.5 in  Accession #:    [id number]       Weight:       202.6 lb  Date of Birth:  Jan 06, 1961        BSA:          2.066 m  Patient Age:    62 years         BP:           145/66 mmHg  Patient Gender: M                HR:  60 bpm.  Exam Location:  Inpatient   Procedure: 2D Echo, Color Doppler and Cardiac Doppler (Both Spectral and  Color            Flow Doppler were utilized during procedure).   Indications:    Acute Inferior Myocardial Infarction    History:        Patient has no prior history of Echocardiogram  examinations.    Sonographer:   Tinnie Gosling RDCS  Referring Phys: [id number] GORDY BERGAMO   IMPRESSIONS     1. Left ventricular ejection fraction, by estimation, is 50 to 55%. The  left ventricle has low normal function. Left ventricular endocardial  border not optimally defined to evaluate regional wall motion. Left  ventricular diastolic parameters were  normal.   2. Right ventricular systolic function is normal. The right ventricular  size is normal. Tricuspid regurgitation signal is inadequate for assessing  PA pressure.   3. The mitral valve is degenerative. No evidence of mitral valve  regurgitation. No evidence of mitral stenosis.   4. The aortic valve is abnormal. Aortic valve regurgitation is mild. No  aortic stenosis is present.   5. The inferior vena cava is normal in size with greater than 50%  respiratory variability, suggesting right atrial pressure of 3 mmHg.   Comparison(s): No prior Echocardiogram.    Conclusion(s)/Recommendation(s): Consider repeat limited echo with  Definity contrast for better endocardial visualization if clinically  indicated.   FINDINGS   Left Ventricle: Left ventricular ejection fraction, by estimation, is 50  to 55%. The left ventricle has low normal function. Left ventricular  endocardial border not optimally defined to evaluate regional wall motion.  The left ventricular internal cavity   size was normal in size. There is no left ventricular hypertrophy. Left  ventricular diastolic parameters were normal.   Right Ventricle: The right ventricular size is normal. No increase in  right ventricular wall thickness. Right ventricular systolic function is  normal. Tricuspid regurgitation signal is inadequate for assessing PA  pressure.   Left Atrium: Left atrial size was normal in size.   Right Atrium: Right atrial size was normal in size.   Pericardium: There is no evidence of pericardial effusion.   Mitral Valve: The mitral valve is degenerative in appearance. Mild to  moderate mitral annular calcification. No evidence of mitral valve  regurgitation. No evidence of mitral valve stenosis.   Tricuspid Valve: The tricuspid valve is normal in structure. Tricuspid  valve regurgitation is not demonstrated. No evidence of tricuspid  stenosis.   Aortic Valve: The aortic valve is abnormal. There is moderate aortic valve  annular calcification. Aortic valve regurgitation is mild. No aortic  stenosis is present.   Pulmonic Valve: The pulmonic valve was normal in structure. Pulmonic valve  regurgitation is not visualized. No evidence of pulmonic stenosis.   Aorta: The aortic root and ascending aorta are structurally normal, with  no evidence of dilitation.   Venous: The inferior vena cava is normal in size with greater than 50%  respiratory variability, suggesting right atrial pressure of 3 mmHg.   IAS/Shunts: No atrial level shunt detected by color flow  Doppler.     LEFT VENTRICLE  PLAX 2D  LVIDd:         4.20 cm   Diastology  LVIDs:         3.00 cm   LV e' medial:    8.38 cm/s  LV PW:         1.00 cm  LV E/e' medial:  10.3  LV IVS:        0.70 cm   LV e' lateral:   10.90 cm/s  LVOT diam:     2.00 cm   LV E/e' lateral: 7.9  LVOT Area:     3.14 cm     RIGHT VENTRICLE             IVC  RV S prime:     10.60 cm/s  IVC diam: 2.00 cm  TAPSE (M-mode): 2.0 cm   LEFT ATRIUM           Index        RIGHT ATRIUM           Index  LA diam:      3.80 cm 1.84 cm/m   RA Area:     11.10 cm  LA Vol (A4C): 27.0 ml 13.07 ml/m  RA Volume:   18.90 ml  9.15 ml/m     AORTA  Ao Root diam: 3.10 cm  Ao Asc diam:  3.40 cm   MITRAL VALVE  MV Area (PHT): 3.61 cm    SHUNTS  MV Decel Time: 210 msec    Systemic Diam: 2.00 cm  MV E velocity: 86.40 cm/s  MV A velocity: 75.50 cm/s  MV E/A ratio:  1.14   Emeline Calender  Electronically signed by Emeline Calender  Signature Date/Time: 06/10/2024/10:20:42 AM        Final     Treatments: surgery:  06/13/2024   Surgeon:  Dorise LOIS Fellers, MD   First Assistant: Con Bend,  PA-C:   An experienced assistant was required given the complexity of this surgery and the standard of surgical care. The assistant was needed for endoscopic vein harvest, exposure, dissection, suctioning, retraction of delicate tissues and sutures, instrument exchange and for overall help during this procedure.     Preoperative Diagnosis:  Severe multi-vessel coronary artery disease     Postoperative Diagnosis:  Same     Procedure:   Median Sternotomy Extracorporeal circulation 3.   Coronary artery bypass grafting x 4   Left internal mammary artery graft to the LAD SVG to diagonal SVG to OM1 SVG to PDA 4.   Endoscopic vein harvest from the both legs   Discharge Exam: Blood pressure 108/68, pulse 72, temperature 98.4 F (36.9 C), temperature source Oral, resp. rate 18, height 5' 9 (1.753 m), weight 89.4 kg, SpO2  98%. Cardiovascular: RRR Pulmonary: Clear to auscultation bilaterally Abdomen: Soft, non tender, bowel sounds present. Extremities: Trace bilateral lower extremity edema. Wounds: Clean and dry.  No erythema or signs of infection.   Discharge Medications:  The patient has been discharged on:   1.Beta Blocker:  Yes [ X  ]                              No   [   ]                              If No, reason:  2.Ace Inhibitor/ARB: Yes [   ]                                     No  [ x   ]  If No, reason:Labile BP;will try to start as an outpatient  3.Statin:   Yes [  X ]                  No  [   ]                  If No, reason:  4.Ecasa:  Yes  [  X ]                  No   [   ]                  If No, reason:  Patient had ACS upon admission: Yes  Plavix/P2Y12 inhibitor: Yes [  X ]                                      No  [   ]     Discharge Instructions     Amb Referral to Cardiac Rehabilitation   Complete by: As directed    Diagnosis: CABG   CABG X ___: 4   After initial evaluation and assessments completed: Virtual Based Care may be provided alone or in conjunction with Phase 2 Cardiac Rehab based on patient barriers.: Yes   Intensive Cardiac Rehabilitation (ICR) [facility] location only OR Traditional Cardiac Rehabilitation (TCR) *If criteria for ICR are not met will enroll in TCR ([facility] only): Yes      Allergies as of 06/18/2024   No Known Allergies      Medication List     TAKE these medications    acetaminophen  325 MG tablet Commonly known as: Tylenol  Take 2 tablets (650 mg total) by mouth every 6 (six) hours as needed for mild pain (pain score 1-3).   allopurinol 300 MG tablet Commonly known as: ZYLOPRIM Take 300 mg by mouth daily.   aspirin EC 81 MG tablet Take 1 tablet (81 mg total) by mouth daily. Swallow whole.   clopidogrel 75 MG tablet Commonly known as: PLAVIX Take 1 tablet (75 mg total) by mouth daily.    colchicine  0.6 MG tablet Take 0.6-1.2 mg by mouth 2 (two) times daily as needed (gout flare).   folic acid 1 MG tablet Commonly known as: FOLVITE Take 1 tablet (1 mg total) by mouth daily.   metoprolol tartrate 25 MG tablet Commonly known as: LOPRESSOR Take 1 tablet (25 mg total) by mouth 2 (two) times daily.   multivitamin with minerals Tabs tablet Take 1 tablet by mouth daily.   oxyCODONE 5 MG immediate release tablet Commonly known as: Oxy IR/ROXICODONE Take 1 tablet (5 mg total) by mouth every 6 (six) hours as needed for severe pain (pain score 7-10).   rosuvastatin 40 MG tablet Commonly known as: CRESTOR Take 1 tablet (40 mg total) by mouth daily.   thiamine 100 MG tablet Commonly known as: Vitamin B-1 Take 1 tablet (100 mg total) by mouth daily.               Durable Medical Equipment  (From admission, onward)           Start     Ordered   06/16/24 1508  For home use only DME Walker rolling  Once       Question Answer Comment  Walker: With 5 Inch Wheels   Patient needs a walker to treat with the following condition Physical  deconditioning   Patient needs a walker to treat with the following condition S/P CABG (coronary artery bypass graft)      06/16/24 1507            Follow-up Information     [facility], Jon Garre, PA Follow up on 07/08/2024.   Specialties: Cardiology, Radiology Why: Cardiology appointment is at 10:05AM Contact information: [address] [name] [address] [phone]         Rutha Manuelita HERO, PA-C Follow up on 06/23/2024.   Specialties: Physician Assistant, Thoracic Surgery Why: Follow up appointment is at 4:00PM, please get a chest xray at 3:00PM prior to your appointment on the 2nd floor of our office Contact information: [address], [name] [address] [phone]         [facility]) Follow up.   Specialty: Home Health Services Why: HHPT/OT arranged-  they will contact you within 1-2 days to schedule visit Contact information: [address] [place]   [address] [phone]                Signed:  Kyla HERO Donald, PA-C  06/18/2024, 7:34 AM

## 2024-06-16 ENCOUNTER — Inpatient Hospital Stay (HOSPITAL_COMMUNITY)

## 2024-06-16 LAB — BASIC METABOLIC PANEL WITH GFR
Anion gap: 10 (ref 5–15)
BUN: 19 mg/dL (ref 6–20)
CO2: 25 mmol/L (ref 22–32)
Calcium: 8.4 mg/dL — ABNORMAL LOW (ref 8.9–10.3)
Chloride: 98 mmol/L (ref 98–111)
Creatinine, Ser: 1.35 mg/dL — ABNORMAL HIGH (ref 0.61–1.24)
GFR, Estimated: >60 mL/min (ref >60)
Glucose, Bld: 94 mg/dL (ref 70–99)
Potassium: 4.2 mmol/L (ref 3.5–5.1)
Sodium: 133 mmol/L — ABNORMAL LOW (ref 135–145)

## 2024-06-16 MED ORDER — LACTULOSE 10 GM/15ML PO SOLN
20.0000 g | Freq: Once | ORAL | Status: AC
  Filled 2024-06-16: qty 30

## 2024-06-16 MED ORDER — IPRATROPIUM-ALBUTEROL 0.5-2.5 (3) MG/3ML IN SOLN
3.0000 mL | RESPIRATORY_TRACT | Status: DC
  Administered 2024-06-16 (×2): 3 mL via RESPIRATORY_TRACT
  Filled 2024-06-16 (×2): qty 3

## 2024-06-16 MED ORDER — FUROSEMIDE 10 MG/ML IJ SOLN
40.0000 mg | Freq: Once | INTRAMUSCULAR | Status: AC
  Administered 2024-06-16: 40 mg via INTRAVENOUS
  Filled 2024-06-16: qty 4

## 2024-06-16 MED ORDER — IPRATROPIUM-ALBUTEROL 0.5-2.5 (3) MG/3ML IN SOLN
3.0000 mL | Freq: Two times a day (BID) | RESPIRATORY_TRACT | Status: DC
  Administered 2024-06-16 – 2024-06-17 (×3): 3 mL via RESPIRATORY_TRACT
  Filled 2024-06-16 (×3): qty 3

## 2024-06-16 MED ORDER — LACTULOSE 10 GM/15ML PO SOLN
20.0000 g | Freq: Two times a day (BID) | ORAL | Status: DC | PRN
  Administered 2024-06-16 – 2024-06-17 (×3): 20 g via ORAL
  Filled 2024-06-16 (×2): qty 30

## 2024-06-16 NOTE — Progress Notes (Signed)
 CARDIAC REHAB PHASE I   Post [facility] education including site care, restrictions, heart healthy diet, sternal precautions, IS use at home, home needs at discharge, exercise guidelines, smoking cessation and CRP2 reviewed. All questions and concerns addressed. Will refer to  [facility] for CRP2.  10:10-10:50  Jacob JAYSON Liverpool, RN BSN 06/16/2024 10:51 AM

## 2024-06-16 NOTE — Evaluation (Signed)
 Physical Therapy Evaluation Patient Details Name: Jacob Black MRN: [id number] DOB: May 13, 1961 Today's Date: 06/16/2024  History of Present Illness  Pt is a 63 y.o. male admitted 06/09/24 for STEMI. Cath with severe multivessel disease. S/p Ballon Angioplasty to RCA and IABP placement. 10/8 IABP removed. 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 PMHx: gout, alcohol use, hyperlipidemia, TUD  Clinical Impression  Pt admitted with above diagnosis. PTA, pt was independent with functional mobility, ADLs/IADLs, driving, and working. He lives with his wife in a one story house with 5 STE. Pt currently with functional limitations due to the deficits listed below (see PT Problem List). He required CGA for bed mobility, supervision for transfers using RW, and CGA for gait using RW. Pt is currently limited by pain, sternal precautions, reduced balance, and decreased activity tolerance. Pt will benefit from acute skilled PT to increase her independence and safety with mobility to allow discharge. Recommend HHPT to advance cardiopulmonary endurance, improve balance, decrease fall risk, and optimize safety within the home environment.     If plan is discharge home, recommend the following: A little help with walking and/or transfers;A little help with bathing/dressing/bathroom;Assistance with cooking/housework;Assist for transportation;Help with stairs or ramp for entrance   Can travel by private vehicle        Equipment Recommendations Rolling walker (2 wheels)  Recommendations for Other Services       Functional Status Assessment Patient has had a recent decline in their functional status and demonstrates the ability to make significant improvements in function in a reasonable and predictable amount of time.     Precautions / Restrictions Precautions Precautions: Sternal;Fall Precaution Booklet Issued: Yes (comment) Recall of Precautions/Restrictions: Intact Restrictions Weight Bearing Restrictions Per Provider  Order: No      Mobility  Bed Mobility Overal bed mobility: Needs Assistance Bed Mobility: Rolling, Sidelying to Sit Rolling: Contact guard assist Sidelying to sit: Contact guard assist       General bed mobility comments: Educated pt on log roll technique. Cued sequencing. Pt held heart pillow throughout movement. Assist at trunk/pelvis to roll onto R side. Assist at trunk/shoulder to elevate pt to sit EOB.    Transfers Overall transfer level: Needs assistance Equipment used: Rolling walker (2 wheels) Transfers: Sit to/from Stand, Bed to chair/wheelchair/BSC Sit to Stand: Supervision   Step pivot transfers: Supervision       General transfer comment: Pt stood from lowest bed height by pushing up with BUE support on knees. He powered up without physical assist. Sup for safety. Good eccentric control.    Ambulation/Gait Ambulation/Gait assistance: Contact guard assist Gait Distance (Feet): 150 Feet (intermittent standing rest breaks) Assistive device: Rolling walker (2 wheels) Gait Pattern/deviations: Step-through pattern, Decreased stride length Gait velocity: decreased Gait velocity interpretation: <1.8 ft/sec, indicate of risk for recurrent falls   General Gait Details: Pt ambulated with a reciprocal gait pattern, even weight shift, and good foot clearence. He maintained upright posture with good proximity to RW. Cued PLB technique throughout session.  Stairs            Wheelchair Mobility     Tilt Bed    Modified Rankin (Stroke Patients Only)       Balance Overall balance assessment: Needs assistance Sitting-balance support: No upper extremity supported, Feet supported Sitting balance-Leahy Scale: Fair     Standing balance support: No upper extremity supported, During functional activity Standing balance-Leahy Scale: Fair Standing balance comment: Static stance is Sup. Dynamic is CGA using RW  Pertinent  Vitals/Pain Pain Assessment Pain Assessment: 0-10 Pain Score: 6  Pain Location: Sternum Pain Descriptors / Indicators: Pressure, Discomfort, Aching Pain Intervention(s): Monitored during session    Home Living Family/patient expects to be discharged to:: Private residence Living Arrangements: Spouse/significant other Available Help at Discharge: Family;Available PRN/intermittently (Pt reports his wife cannot physically assist him much of all if any) Type of Home: House Home Access: Stairs to enter Entrance Stairs-Rails: Doctor, general practice of Steps: 5   Home Layout: One level Home Equipment: Cane - single point;Grab bars - tub/shower;Hand held shower head      Prior Function Prior Level of Function : Independent/Modified Independent;Driving;Working/employed             Mobility Comments: Ambulates without AD. Denies fall hx. ADLs Comments: Indep with ADLs/IADLs. 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     Extremity/Trunk Assessment   Upper Extremity Assessment Upper Extremity Assessment: Defer to OT evaluation    Lower Extremity Assessment Lower Extremity Assessment: Overall WFL for tasks assessed    Cervical / Trunk Assessment Cervical / Trunk Assessment: Normal  Communication   Communication Communication: No apparent difficulties    Cognition Arousal: Alert Behavior During Therapy: WFL for tasks assessed/performed   PT - Cognitive impairments: No apparent impairments                       PT - Cognition Comments: Pt A,Ox4 Following commands: Intact       Cueing Cueing Techniques: Verbal cues     General Comments General comments (skin integrity, edema, etc.): VSS on RA    Exercises     Assessment/Plan    PT Assessment Patient needs continued PT services  PT Problem List Decreased activity tolerance;Decreased balance;Decreased mobility;Decreased knowledge of use of DME;Decreased safety  awareness;Decreased knowledge of precautions;Pain       PT Treatment Interventions DME instruction;Gait training;Stair training;Functional mobility training;Therapeutic activities;Therapeutic exercise;Balance training;Patient/family education    PT Goals (Current goals can be found in the Care Plan section)  Acute Rehab PT Goals Patient Stated Goal: Regain independence and return home PT Goal Formulation: With patient Time For Goal Achievement: 06/30/24 Potential to Achieve Goals: Good    Frequency Min 2X/week     Co-evaluation               AM-PAC PT 6 Clicks Mobility  Outcome Measure Help needed turning from your back to your side while in a flat bed without using bedrails?: A Little Help needed moving from lying on your back to sitting on the side of a flat bed without using bedrails?: A Little Help needed moving to and from a bed to a chair (including a wheelchair)?: A Little Help needed standing up from a chair using your arms (e.g., wheelchair or bedside chair)?: A Little Help needed to walk in hospital room?: A Little Help needed climbing 3-5 steps with a railing? : A Lot 6 Click Score: 17    End of Session Equipment Utilized During Treatment: Gait belt Activity Tolerance: Patient tolerated treatment well Patient left: in chair;with call bell/phone within reach;with chair alarm set Nurse Communication: Mobility status PT Visit Diagnosis: Other abnormalities of gait and mobility (R26.89);Unsteadiness on feet (R26.81)    Time: 8856-8788 PT Time Calculation (min) (ACUTE ONLY): 28 min   Charges:   PT Evaluation $PT Eval Moderate Complexity: 1 Mod PT Treatments $Gait Training: 8-22 mins PT General Charges $$ ACUTE PT VISIT: 1 Visit  Randall SAUNDERS, PT, DPT Acute Rehabilitation Services Office: [phone] Secure Chat Preferred  Delon CHRISTELLA Callander 06/16/2024, 1:37 PM

## 2024-06-16 NOTE — Progress Notes (Addendum)
 [address] [place] [address]             [phone]      3 Days Post-Op Procedure(s) (LRB): CORONARY ARTERY BYPASS GRAFTING (CABG) X FOUR USING LEFT INTERNAL MAMMARY ARTERY AND BILATERAL GREATER SAPHENOUS VEIN HARVESTED ENDOSCOPICALLY (N/A) ECHOCARDIOGRAM, TRANSESOPHAGEAL, INTRAOPERATIVE (N/A) Subjective: Patient reports he is having some pain and has been wheezing this AM.   Objective: Vital signs in last 24 hours: Temp:  [97.9 F (36.6 C)-98.5 F (36.9 C)] 98.2 F (36.8 C) (10/14 0321) Pulse Rate:  [65-80] 75 (10/14 0321) Cardiac Rhythm: Normal sinus rhythm;Heart block (10/13 1904) Resp:  [14-29] 20 (10/14 0321) BP: (95-146)/(63-86) 146/86 (10/14 0321) SpO2:  [86 %-99 %] 90 % (10/14 0321) Weight:  [92.8 kg] 92.8 kg (10/14 0321)  Hemodynamic parameters for last 24 hours:    Intake/Output from previous day: 10/13 0701 - 10/14 0700 In: 3 [I.V.:3] Out: 350 [Urine:350] Intake/Output this shift: No intake/output data recorded.  General appearance: alert, cooperative, and no distress Neurologic: intact Heart: regular rate and rhythm, S1, S2 normal, no murmur, click, rub or gallop Lungs: diminished bibasilar breath sounds, no wheezes or rhonchi Abdomen: soft, non-tender; bowel sounds normal; no masses,  no organomegaly Extremities: edema trace-1+ BLE Wound: Clean and dry without sign of infection  Lab Results: Recent Labs    06/14/24 1658 06/15/24 0421  WBC 12.9* 13.6*  HGB 11.5* 11.0*  HCT 34.8* 32.7*  PLT 167 161   BMET:  Recent Labs    06/15/24 0421 06/16/24 0317  NA 130* 133*  K 4.4 4.2  CL 97* 98  CO2 24 25  GLUCOSE 100* 94  BUN 15 19  CREATININE 1.26* 1.35*  CALCIUM 8.4* 8.4*    PT/INR:  Recent Labs    06/13/24 1447  LABPROT 17.4*  INR 1.4*   ABG    Component Value Date/Time   PHART 7.367 06/13/2024 2007   HCO3 21.0 06/13/2024 2007   TCO2 22 06/13/2024 2007   ACIDBASEDEF 4.0 (H) 06/13/2024 2007   O2SAT  97 06/13/2024 2007   CBG (last 3)  Recent Labs    06/14/24 2359 06/15/24 0426 06/15/24 0723  GLUCAP 106* 101* 119*    Assessment/Plan: S/P Procedure(s) (LRB): CORONARY ARTERY BYPASS GRAFTING (CABG) X FOUR USING LEFT INTERNAL MAMMARY ARTERY AND BILATERAL GREATER SAPHENOUS VEIN HARVESTED ENDOSCOPICALLY (N/A) ECHOCARDIOGRAM, TRANSESOPHAGEAL, INTRAOPERATIVE (N/A)  Neuro: Hx of alcohol use (3 liquor drinks per day), CIWA protocol. No signs of withdrawal. Continue MVI, thiamine, and folic acid  CV: BP controlled, SBP mostly 110s-130s, up to 140 this AM. NSR, HR 70s. 1st degree AVB. Hx of STEMI on Plavix. On Lopressor 25mg  BID.   Pulm: Saturating well on RA this AM. CXR with low lung volumes and bibasilar atelectasis. Encourage IS and ambulation. Patient reports wheezing this AM, hx of tobacco abuse. Will order Duoneb and flutter valve.   GI: -BM, tolerating a diet. Will add lactulose this AM.   Endo: Hx of gout, on Allopurinol. Takes colchicine  PRN for flares. No hx of DM, SSI and CBGs have been d/c'd.  Renal: Cr 1.35 slightly bumped from 1.26 yesterday likely due to diuresis. Baseline creatinine 1.1-1.2. UO 350cc/24hrs recorded. +9lbs from preop weight, +1lb from yesterday with Demadex 20mg . May benefit from another dose of IV Lasix.   ID: Likely reactive leukocytosis, last WBC 13.6. Afebrile. Clinically monitor.   Expected postop ABLA: H/H 11/32.7, stable. Not clinically significant at  this time.   DVT Prophylaxis: Lovenox   Deconditioning: Patient reports he can't sit up independently yet but is ambulating down the hall. He reports his wife will not be any help at home. Will get PT/OT evals  Dispo: Hopefully can d/c home next 24-48 hours   LOS: 6 days    Con GORMAN Bend, PA-C 06/16/2024   Chart reviewed, patient examined, agree with above.  He is doing well overall considering his heavy smoking hx. Says he was wheezing this am but better with breathing treatment. No BM  yet.  He is diuresing with IV lasix today.

## 2024-06-17 LAB — BASIC METABOLIC PANEL WITH GFR
Anion gap: 11 (ref 5–15)
BUN: 26 mg/dL — ABNORMAL HIGH (ref 6–20)
CO2: 25 mmol/L (ref 22–32)
Calcium: 8.5 mg/dL — ABNORMAL LOW (ref 8.9–10.3)
Chloride: 97 mmol/L — ABNORMAL LOW (ref 98–111)
Creatinine, Ser: 1.43 mg/dL — ABNORMAL HIGH (ref 0.61–1.24)
GFR, Estimated: 56 mL/min — ABNORMAL LOW (ref >60)
Glucose, Bld: 110 mg/dL — ABNORMAL HIGH (ref 70–99)
Potassium: 4.2 mmol/L (ref 3.5–5.1)
Sodium: 133 mmol/L — ABNORMAL LOW (ref 135–145)

## 2024-06-17 MED ORDER — TORSEMIDE 20 MG PO TABS
40.0000 mg | ORAL_TABLET | Freq: Every day | ORAL | Status: DC
  Administered 2024-06-17 – 2024-06-18 (×2): 40 mg via ORAL
  Filled 2024-06-17 (×2): qty 2

## 2024-06-17 MED ORDER — POLYETHYLENE GLYCOL 3350 17 G PO PACK: 34.0000 g | PACK | ORAL | Status: DC | PRN

## 2024-06-17 MED ORDER — POLYETHYLENE GLYCOL 3350 17 G PO PACK
34.0000 g | PACK | Freq: Two times a day (BID) | ORAL | Status: DC | PRN
  Administered 2024-06-17: 34 g via ORAL
  Filled 2024-06-17: qty 2

## 2024-06-17 MED ORDER — POTASSIUM CHLORIDE CRYS ER 20 MEQ PO TBCR
20.0000 meq | EXTENDED_RELEASE_TABLET | Freq: Every day | ORAL | Status: DC
  Administered 2024-06-17 – 2024-06-18 (×2): 20 meq via ORAL
  Filled 2024-06-17 (×2): qty 1

## 2024-06-17 NOTE — Progress Notes (Signed)
 Physical Therapy Treatment Patient Details Name: Jacob Black MRN: [id number] DOB: 1960-11-14 Today's Date: 06/17/2024   History of Present Illness Pt is a 63 y.o. male admitted 06/09/24 for STEMI. Cath with severe multivessel disease. S/p Ballon Angioplasty to RCA and IABP placement. 10/8 IABP removed. 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 PMHx: gout, alcohol use, hyperlipidemia, TUD    PT Comments  Pt received in supine, agreeable to therapy session, with fair recall of sternal precs. Reinforced precs with functional transfers and with ADL tasks as pt reports he is finally able to have BM. BP stable with postural changes (mild dizziness but no drop in MAP with sitting/standing). Pt able to perform stairs with 1-2 UE support and CGA, and gait with Supervision today using RW. Pt continues to benefit from PT services to progress toward functional mobility goals, continue to recommend OP cardiac rehab vs HHPT upon DC.     If plan is discharge home, recommend the following: A little help with walking and/or transfers;A little help with bathing/dressing/bathroom;Assistance with cooking/housework;Assist for transportation;Help with stairs or ramp for entrance   Can travel by private vehicle        Equipment Recommendations  Rolling walker (2 wheels)    Recommendations for Other Services       Precautions / Restrictions Precautions Precautions: Sternal;Fall Precaution Booklet Issued: Yes (comment) Recall of Precautions/Restrictions: Intact Precaution/Restrictions Comments: needs cues for precs during bed mobility intermittently Restrictions Weight Bearing Restrictions Per Provider Order: No Other Position/Activity Restrictions: sternal precs     Mobility  Bed Mobility Overal bed mobility: Needs Assistance Bed Mobility: Rolling, Sidelying to Sit, Sit to Sidelying Rolling: Supervision Sidelying to sit: Supervision, HOB elevated     Sit to sidelying: Supervision, HOB elevated General bed mobility  comments: Instruction on log roll technique/not to wing out elbows when pushing up; partially compliant until final portion of transfer elevating trunk; better compliance when returning to supine.    Transfers Overall transfer level: Needs assistance Equipment used: None Transfers: Sit to/from Stand, Bed to chair/wheelchair/BSC Sit to Stand: Supervision           General transfer comment: Cues to keep elbows tucked in closer to ribs when pushing from his knees; no imbalance upon standing    Ambulation/Gait Ambulation/Gait assistance: Contact guard assist, Supervision Gait Distance (Feet): 125 Feet Assistive device: Rolling walker (2 wheels) Gait Pattern/deviations: Step-through pattern, Decreased stride length Gait velocity: decreased     General Gait Details: short distance in room wtihout RW and CGA, otherwise Supervision   Stairs Stairs: Yes Stairs assistance: Contact guard assist Stair Management: One rail Right, Forwards, Backwards, Step to pattern Number of Stairs: 5 General stair comments: pt alternating which leg ascends (incisions on both legs post-op), seems to do better ascending wtih one leg than the other, guarding significantly when stepping down no matter which leg leads, CGA for safety and no buckling; does better with BUE support   Wheelchair Mobility     Tilt Bed    Modified Rankin (Stroke Patients Only)       Balance Overall balance assessment: Needs assistance Sitting-balance support: No upper extremity supported, Feet supported Sitting balance-Leahy Scale: Good     Standing balance support: No upper extremity supported, During functional activity Standing balance-Leahy Scale: Fair Standing balance comment: supervision wtih RW, CGA unsupported                            Communication Communication Communication: No  apparent difficulties  Cognition Arousal: Alert Behavior During Therapy: WFL for tasks assessed/performed   PT  - Cognitive impairments: No apparent impairments                         Following commands: Intact      Cueing Cueing Techniques: Verbal cues, Gestural cues, Tactile cues  Exercises Other Exercises Other Exercises: verbal review for standing heel raises, mini squats and hip flexion with chair behind him to rest as tolerated, pt defers at time of sesison due to increased incisional pain post-exertion.    General Comments General comments (skin integrity, edema, etc.): BP 108/68 sitting, mild c/o lightheadedness, checked standing BP at 0 mins and 3 mins, no significant drop in MAP and symptoms did not increase. SpO2 WFL on RA, HR WFL.      Pertinent Vitals/Pain Pain Assessment Pain Assessment: 0-10 Pain Score: 7  Pain Location: Sternum Pain Descriptors / Indicators: Pressure, Discomfort, Aching Pain Intervention(s): Monitored during session, Limited activity within patient's tolerance, Repositioned    Home Living                          Prior Function            PT Goals (current goals can now be found in the care plan section) Acute Rehab PT Goals Patient Stated Goal: Regain independence and return home PT Goal Formulation: With patient Time For Goal Achievement: 06/30/24 Progress towards PT goals: Progressing toward goals    Frequency    Min 2X/week      PT Plan      Co-evaluation              AM-PAC PT 6 Clicks Mobility   Outcome Measure  Help needed turning from your back to your side while in a flat bed without using bedrails?: A Little Help needed moving from lying on your back to sitting on the side of a flat bed without using bedrails?: A Little Help needed moving to and from a bed to a chair (including a wheelchair)?: A Little Help needed standing up from a chair using your arms (e.g., wheelchair or bedside chair)?: A Little Help needed to walk in hospital room?: A Little Help needed climbing 3-5 steps with a railing? : A  Little 6 Click Score: 18    End of Session Equipment Utilized During Treatment: Gait belt Activity Tolerance: Patient tolerated treatment well;Patient limited by pain Patient left: in bed;with call bell/phone within reach (pt refusing bed alarm, it was off prior to PTA arrival to room also) Nurse Communication: Mobility status;Patient requests pain meds;Other (comment) (pt refused bed alarm) PT Visit Diagnosis: Other abnormalities of gait and mobility (R26.89);Unsteadiness on feet (R26.81)     Time: 8442-8388 PT Time Calculation (min) (ACUTE ONLY): 14 min  Charges:    $Gait Training: 8-22 mins PT General Charges $$ ACUTE PT VISIT: 1 Visit                     Lonnel Gjerde P., PTA Acute Rehabilitation Services Secure Chat Preferred 9a-5:30pm Office: [phone]    Connell CHRISTELLA Blue 06/17/2024, 6:10 PM

## 2024-06-17 NOTE — Evaluation (Signed)
 Occupational Therapy Evaluation Patient Details Name: Jacob Black MRN: [id number] DOB: 1961/04/12 Today's Date: 06/17/2024   History of Present Illness   Pt is a 63 y.o. male admitted 06/09/24 for STEMI. Cath with severe multivessel disease. S/p Ballon Angioplasty to RCA and IABP placement. 10/8 IABP removed. 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 PMHx: gout, alcohol use, hyperlipidemia, TUD     Clinical Impressions Patient lives with wife in a 1 level home with 5 STE and reports being Indep in ADLs and IADLs.  Patient currently requires CGA for functional mobility with no AD  and SBA/CGA for ADLs with verbal cues to adhere to sternal precautions.  Patient would benefit from additional OT intervention to address functional deficits of ADLs, safety and eduction on sternal precautions.  Patient would benefit from additional OT intervention while here at [facility] although no further OT intervention once returns home 2/2 availability of wife to help as needed     If plan is discharge home, recommend the following:         Functional Status Assessment   Patient has had a recent decline in their functional status and demonstrates the ability to make significant improvements in function in a reasonable and predictable amount of time.     Equipment Recommendations   None recommended by OT     Recommendations for Other Services         Precautions/Restrictions   Precautions Precautions: Sternal;Fall Precaution Booklet Issued: Yes (comment) Recall of Precautions/Restrictions: Intact Restrictions Weight Bearing Restrictions Per Provider Order: Yes RLE Weight Bearing Per Provider Order: Non weight bearing     Mobility Bed Mobility Overal bed mobility: Needs Assistance Bed Mobility: Rolling, Sidelying to Sit Rolling: Contact guard assist Sidelying to sit: Contact guard assist       General bed mobility comments:  (edcuated on making sure tonot push through UEs to scoot hips  forward/backward onto bed but instead make sure to cross UEs across chest to help with not using them to asssit with movement)    Transfers Overall transfer level: Needs assistance Equipment used: None Transfers: Sit to/from Stand, Bed to chair/wheelchair/BSC Sit to Stand: Supervision                  Balance Overall balance assessment: Needs assistance Sitting-balance support: No upper extremity supported, Feet supported Sitting balance-Leahy Scale: Fair                                     ADL either performed or assessed with clinical judgement   ADL Overall ADL's : Needs assistance/impaired Eating/Feeding: Independent;Sitting   Grooming: Wash/dry hands;Wash/dry face;Supervision/safety;Standing   Upper Body Bathing: Minimal assistance;Sitting   Lower Body Bathing: Contact guard assist   Upper Body Dressing : Minimal assistance;Sitting   Lower Body Dressing: Contact guard assist   Toilet Transfer: Contact guard assist   Toileting- Clothing Manipulation and Hygiene: Contact guard assist       Functional mobility during ADLs: Contact guard assist       Vision Baseline Vision/History: 1 Wears glasses (reading) Patient Visual Report: No change from baseline       Perception Perception: Within Functional Limits       Praxis Praxis: WFL       Pertinent Vitals/Pain Pain Assessment Pain Assessment: 0-10 Pain Score: 6  Pain Location: Sternum Pain Descriptors / Indicators: Pressure, Discomfort, Aching Pain Intervention(s): Monitored during session  Extremity/Trunk Assessment Upper Extremity Assessment Upper Extremity Assessment: Overall WFL for tasks assessed (withing sternal precautions)   Lower Extremity Assessment Lower Extremity Assessment: Overall WFL for tasks assessed   Cervical / Trunk Assessment Cervical / Trunk Assessment: Normal   Communication Communication Communication: No apparent difficulties   Cognition  Arousal: Alert Behavior During Therapy: WFL for tasks assessed/performed Cognition: No apparent impairments                               Following commands: Intact       Cueing  General Comments   Cueing Techniques: Verbal cues      Exercises     Shoulder Instructions      Home Living Family/patient expects to be discharged to:: Private residence Living Arrangements: Spouse/significant other Available Help at Discharge: Family;Available PRN/intermittently Type of Home: House Home Access: Stairs to enter [employer] of Steps: 5 Entrance Stairs-Rails: Right;Left Home Layout: One level     Bathroom Shower/Tub: Producer, television/film/video: Handicapped height     Home Equipment: Cane - single point;Grab bars - tub/shower;Hand held shower head          Prior Functioning/Environment Prior Level of Function : Independent/Modified Independent;Driving;Working/employed             Mobility Comments: Ambulates without AD. Denies fall hx. ADLs Comments: Indep with ADLs/IADLs. 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    OT Problem List: Decreased safety awareness   OT Treatment/Interventions: Self-care/ADL training;Therapeutic activities;Patient/family education;DME and/or AE instruction      OT Goals(Current goals can be found in the care plan section)   Acute Rehab OT Goals OT Goal Formulation: With patient Time For Goal Achievement: 07/01/24 Potential to Achieve Goals: Good ADL Goals Pt Will Perform Upper Body Bathing: with supervision Pt Will Perform Lower Body Bathing: with supervision Pt Will Perform Upper Body Dressing: with supervision Pt Will Perform Lower Body Dressing: with supervision Pt Will Transfer to Toilet: with supervision   OT Frequency:  Min 2X/week    Co-evaluation              AM-PAC OT 6 Clicks Daily Activity     Outcome Measure Help from another person eating meals?:  None Help from another person taking care of personal grooming?: A Little Help from another person toileting, which includes using toliet, bedpan, or urinal?: A Little Help from another person bathing (including washing, rinsing, drying)?: A Little Help from another person to put on and taking off regular upper body clothing?: A Little Help from another person to put on and taking off regular lower body clothing?: A Little 6 Click Score: 19   End of Session Nurse Communication: Mobility status  Activity Tolerance: Patient tolerated treatment well Patient left: in bed;with call bell/phone within reach  OT Visit Diagnosis: Unsteadiness on feet (R26.81)                Time: 8955-8944 OT Time Calculation (min): 11 min Charges:  OT General Charges $OT Visit: 1 Visit OT Evaluation $OT Eval Moderate Complexity: 1 Mod  Lamarr Pouch OT/L  Lamarr JONETTA Pouch 06/17/2024, 1:58 PM

## 2024-06-17 NOTE — Plan of Care (Signed)
  Problem: Education: Goal: Knowledge of General Education information will improve Description: Including pain rating scale, medication(s)/side effects and non-pharmacologic comfort measures Outcome: Progressing   Problem: Health Behavior/Discharge Planning: Goal: Ability to manage health-related needs will improve Outcome: Progressing   Problem: Clinical Measurements: Goal: Ability to maintain clinical measurements within normal limits will improve Outcome: Progressing Goal: Will remain free from infection Outcome: Progressing Goal: Diagnostic test results will improve Outcome: Progressing Goal: Respiratory complications will improve Outcome: Progressing Goal: Cardiovascular complication will be avoided Outcome: Progressing   Problem: Activity: Goal: Risk for activity intolerance will decrease Outcome: Progressing   Problem: Nutrition: Goal: Adequate nutrition will be maintained Outcome: Progressing   Problem: Coping: Goal: Level of anxiety will decrease Outcome: Progressing   Problem: Elimination: Goal: Will not experience complications related to bowel motility Outcome: Progressing Goal: Will not experience complications related to urinary retention Outcome: Progressing   Problem: Pain Managment: Goal: General experience of comfort will improve and/or be controlled Outcome: Progressing   Problem: Safety: Goal: Ability to remain free from injury will improve Outcome: Progressing   Problem: Skin Integrity: Goal: Risk for impaired skin integrity will decrease Outcome: Progressing   Problem: Education: Goal: Understanding of CV disease, CV risk reduction, and recovery process will improve Outcome: Progressing Goal: Individualized Educational Video(s) Outcome: Progressing   Problem: Activity: Goal: Ability to return to baseline activity level will improve Outcome: Progressing   Problem: Cardiovascular: Goal: Ability to achieve and maintain adequate  cardiovascular perfusion will improve Outcome: Progressing Goal: Vascular access site(s) Level 0-1 will be maintained Outcome: Progressing   Problem: Health Behavior/Discharge Planning: Goal: Ability to safely manage health-related needs after discharge will improve Outcome: Progressing   Problem: Education: Goal: Will demonstrate proper wound care and an understanding of methods to prevent future damage Outcome: Progressing Goal: Knowledge of disease or condition will improve Outcome: Progressing Goal: Knowledge of the prescribed therapeutic regimen will improve Outcome: Progressing Goal: Individualized Educational Video(s) Outcome: Progressing   Problem: Activity: Goal: Risk for activity intolerance will decrease Outcome: Progressing   Problem: Cardiac: Goal: Will achieve and/or maintain hemodynamic stability Outcome: Progressing   Problem: Clinical Measurements: Goal: Postoperative complications will be avoided or minimized Outcome: Progressing   Problem: Respiratory: Goal: Respiratory status will improve Outcome: Progressing   Problem: Skin Integrity: Goal: Wound healing without signs and symptoms of infection Outcome: Progressing Goal: Risk for impaired skin integrity will decrease Outcome: Progressing   Problem: Urinary Elimination: Goal: Ability to achieve and maintain adequate renal perfusion and functioning will improve Outcome: Progressing

## 2024-06-17 NOTE — Progress Notes (Signed)
 06/17/2024 1:24 AM Pt ambulated 470 ft with RW.  Tolerated without any difficulty. Jacob Black

## 2024-06-17 NOTE — Progress Notes (Addendum)
 [address] [employer] [address]             [phone]      4 Days Post-Op Procedure(s) (LRB): CORONARY ARTERY BYPASS GRAFTING (CABG) X FOUR USING LEFT INTERNAL MAMMARY ARTERY AND BILATERAL GREATER SAPHENOUS VEIN HARVESTED ENDOSCOPICALLY (N/A) ECHOCARDIOGRAM, TRANSESOPHAGEAL, INTRAOPERATIVE (N/A) Subjective: Patient reports his pain is a 6-7 and he has some shortness of breath with ambulation  Objective: Vital signs in last 24 hours: Temp:  [97.7 F (36.5 C)-98.5 F (36.9 C)] 97.7 F (36.5 C) (10/15 0414) Pulse Rate:  [71-82] 71 (10/15 0414) Cardiac Rhythm: Normal sinus rhythm;Heart block (10/14 0753) Resp:  [17-20] 20 (10/15 0414) BP: (117-133)/(63-69) 133/68 (10/15 0414) SpO2:  [92 %-99 %] 95 % (10/15 0414) Weight:  [92 kg] 92 kg (10/15 0407)  Hemodynamic parameters for last 24 hours:    Intake/Output from previous day: 10/14 0701 - 10/15 0700 In: 6 [I.V.:6] Out: -  Intake/Output this shift: No intake/output data recorded.  General appearance: alert, cooperative, and no distress Neurologic: intact Heart: regular rate and rhythm, S1, S2 normal, no murmur, click, rub or gallop Lungs: clear to auscultation bilaterally Abdomen: +BS, nontender, minimal distension Extremities: edema trace BLE Wound: Clean and dry without sign of infection  Lab Results: Recent Labs    06/14/24 1658 06/15/24 0421  WBC 12.9* 13.6*  HGB 11.5* 11.0*  HCT 34.8* 32.7*  PLT 167 161   BMET:  Recent Labs    06/16/24 0317 06/17/24 0313  NA 133* 133*  K 4.2 4.2  CL 98 97*  CO2 25 25  GLUCOSE 94 110*  BUN 19 26*  CREATININE 1.35* 1.43*  CALCIUM 8.4* 8.5*    PT/INR: No results for input(s): LABPROT, INR in the last 72 hours. ABG    Component Value Date/Time   PHART 7.367 06/13/2024 2007   HCO3 21.0 06/13/2024 2007   TCO2 22 06/13/2024 2007   ACIDBASEDEF 4.0 (H) 06/13/2024 2007   O2SAT 97 06/13/2024 2007   CBG (last 3)  Recent Labs     06/14/24 2359 06/15/24 0426 06/15/24 0723  GLUCAP 106* 101* 119*    Assessment/Plan: S/P Procedure(s) (LRB): CORONARY ARTERY BYPASS GRAFTING (CABG) X FOUR USING LEFT INTERNAL MAMMARY ARTERY AND BILATERAL GREATER SAPHENOUS VEIN HARVESTED ENDOSCOPICALLY (N/A) ECHOCARDIOGRAM, TRANSESOPHAGEAL, INTRAOPERATIVE (N/A)\  Neuro: Hx of alcohol use (3 liquor drinks per day), CIWA precautions. No signs of withdrawal. Continue MVI, thiamine, and folic acid   CV: BP controlled, SBP mostly 120s-130s. NSR, HR 70s. Hx of STEMI on Plavix. On Lopressor 25mg  BID.    Pulm: Saturating well on RA this AM. Last CXR with low lung volumes and bibasilar atelectasis. Encourage IS, flutter valve and ambulation. Continue duoneb for intermittent wheezing, hx of heavy tobacco use.   GI: -BM, tolerating a diet. Give another dose of lactulose this AM.    Endo: Hx of gout, on Allopurinol. Takes colchicine  PRN for flares. No hx of DM, SSI and CBGs have been d/c'd.   Renal: Cr 1.43 slightly bumped from 1.35 yesterday likely due to diuresis. Baseline creatinine 1.1-1.2. No UO recorded. +7lbs from preop weight, -2lbs from yesterday. Hold diuresis vs give PO lasix today  ID: Likely reactive leukocytosis, last WBC 13.6. Afebrile. Clinically monitor.    Expected postop ABLA: H/H 11/32.7, stable. Not clinically significant at this time.    DVT Prophylaxis: Lovenox    Deconditioning: PT/OT evals recommend HH PT and RW which has been  ordered  Dispo: Hopefully can d/c home tomorrow AM   LOS: 7 days    Con GORMAN Bend, PA-C 06/17/2024   Chart reviewed, patient examined, agree with above.  His has been hemodynamically stable, off oxygen . Bowels moved today. He is ambulating. Wt this evening was only 1 lb over preop. Will plan home tomorrow if no changes.

## 2024-06-17 NOTE — TOC Initial Note (Signed)
 Transition of Care (TOC) - Initial/Assessment Note  Rayfield Gobble RN, BSN Inpatient Care Management Unit 4E- RN Case Manager See Treatment Team for direct phone #   Patient Details  Name: Jacob Black MRN: [id number] Date of Birth: 03/11/1961  Transition of Care [facility]) CM/SW Contact:    Gobble Rayfield Hurst, RN Phone Number: 06/17/2024, 2:32 PM  Clinical Narrative:                 Orders in for [facility] and DME needs s/p CABG.   CM in to speak with pt at bedside for transition needs, per pt he will have transportation home. States he already has RW available to use on discharge- declines DME- RW.  Discussed HH orders and TCTS office referral for [facility] needs- choice offered for [facility] provider. Pt voiced he is agreeable to use [facility] for his HH needs as he does not have a preference. CM has confirmed with [facility] liaison that they can service pt under their HP branch- branch has accepted referral.   No further IM CM needs noted at this time.   Expected Discharge Plan: Home w Home Health Services Barriers to Discharge: No Barriers Identified   Patient Goals and CMS Choice Patient states their goals for this hospitalization and ongoing recovery are:: return home   Choice offered to / list presented to : Patient      Expected Discharge Plan and Services   Discharge Planning Services: CM Consult Post Acute Care Choice: Durable Medical Equipment, Home Health Living arrangements for the past 2 months: Single Family Home                 DME Arranged: Walker rolling, Patient refused services DME Agency: NA       HH Arranged: PT, OT HH Agency: [facility] Date HH Agency Contacted: 06/17/24 Time HH Agency Contacted: 1431 Representative spoke with at [facility] Agency: Zebedee  Prior Living Arrangements/Services Living arrangements for the past 2 months: Single Family Home Lives with:: Spouse Patient language and need for interpreter reviewed:: Yes Do you feel safe  going back to the place where you live?: Yes      Need for Family Participation in Patient Care: Yes (Comment) Care giver support system in place?: Yes (comment) Current home services: DME (rolling walker) Criminal Activity/Legal Involvement Pertinent to Current Situation/Hospitalization: No - Comment as needed  Activities of Daily Living   ADL Screening (condition at time of admission) Independently performs ADLs?: Yes (appropriate for developmental age) Is the patient deaf or have difficulty hearing?: No Does the patient have difficulty seeing, even when wearing glasses/contacts?: No Does the patient have difficulty concentrating, remembering, or making decisions?: No  Permission Sought/Granted Permission sought to share information with : Facility Industrial/product designer granted to share information with : Yes, Verbal Permission Granted     Permission granted to share info w AGENCY: HH        Emotional Assessment Appearance:: Appears stated age Attitude/Demeanor/Rapport: Gracious Affect (typically observed): Accepting Orientation: : Oriented to Self, Oriented to Place, Oriented to  Time, Oriented to Situation Alcohol / Substance Use: Not Applicable Psych Involvement: No (comment)  Admission diagnosis:  Acute inferior myocardial infarction (HCC) [I21.19] S/P CABG x 4 [Z95.1] Patient Active Problem List   Diagnosis Date Noted   S/P CABG x 4 06/13/2024   Acute inferior myocardial infarction (HCC) 06/10/2024   Triple vessel coronary artery disease 06/10/2024   Acute ST elevation myocardial infarction (STEMI) of inferior wall (HCC)  06/09/2024   Hypercholesteremia 06/09/2024   Chronic hepatitis C without hepatic coma (HCC) 08/10/2015   Gout 08/10/2015   PCP:  Waylan Almarie SAUNDERS, MD Pharmacy:   [phone] - [place], [place] - [phone] AT [facility] [address] [name] [address] Phone: [phone] Fax: [phone]  [facility] [address], [address] [name] [address] Phone: [phone] Fax: [phone]  [facility] - [place], [name] - [address] [name] [address] Phone: [phone] Fax: [phone]  [id number] [place] [name] - [address] [name] [address] Phone: [phone] Fax: [phone]  [id number] - [place], [place] - [address] AT [facility] OF [address] [name] [address] Phone: [phone] Fax: [phone]     Social Drivers of Health (SDOH) Social History: SDOH Screenings   Food Insecurity: No Food Insecurity (06/10/2024)  Housing: Low Risk  (06/10/2024)  Transportation Needs: No Transportation Needs (06/10/2024)  Utilities: Not At Risk (06/10/2024)  Social Connections: Unknown (06/10/2024)  Tobacco Use: High Risk (06/13/2024)   SDOH Interventions:     Readmission Risk Interventions     No data to display

## 2024-06-18 ENCOUNTER — Other Ambulatory Visit (HOSPITAL_COMMUNITY): Payer: Self-pay

## 2024-06-18 LAB — BASIC METABOLIC PANEL WITH GFR
Anion gap: 11 (ref 5–15)
BUN: 28 mg/dL — ABNORMAL HIGH (ref 6–20)
CO2: 24 mmol/L (ref 22–32)
Calcium: 8.6 mg/dL — ABNORMAL LOW (ref 8.9–10.3)
Chloride: 98 mmol/L (ref 98–111)
Creatinine, Ser: 1.5 mg/dL — ABNORMAL HIGH (ref 0.61–1.24)
GFR, Estimated: 53 mL/min — ABNORMAL LOW
Glucose, Bld: 98 mg/dL (ref 70–99)
Potassium: 4 mmol/L (ref 3.5–5.1)
Sodium: 133 mmol/L — ABNORMAL LOW (ref 135–145)

## 2024-06-18 MED ORDER — CLOPIDOGREL BISULFATE 75 MG PO TABS
75.0000 mg | ORAL_TABLET | Freq: Every day | ORAL | 1 refills | Status: DC
  Filled 2024-06-18: qty 30, 30d supply, fill #0

## 2024-06-18 MED ORDER — VITAMIN B-1 100 MG PO TABS: 100.0000 mg | ORAL_TABLET | Freq: Every day | ORAL | Status: DC

## 2024-06-18 MED ORDER — FOLIC ACID 1 MG PO TABS: 1.0000 mg | ORAL_TABLET | Freq: Every day | ORAL | Status: AC

## 2024-06-18 MED ORDER — METOPROLOL TARTRATE 25 MG PO TABS
25.0000 mg | ORAL_TABLET | Freq: Two times a day (BID) | ORAL | 1 refills | Status: DC
  Filled 2024-06-18: qty 60, 30d supply, fill #0

## 2024-06-18 MED ORDER — ACETAMINOPHEN 325 MG PO TABS: 650.0000 mg | ORAL_TABLET | Freq: Four times a day (QID) | ORAL | Status: AC | PRN

## 2024-06-18 MED ORDER — ROSUVASTATIN CALCIUM 40 MG PO TABS
40.0000 mg | ORAL_TABLET | Freq: Every day | ORAL | 1 refills | Status: DC
  Filled 2024-06-18: qty 30, 30d supply, fill #0

## 2024-06-18 MED ORDER — ASPIRIN 81 MG PO TBEC: 81.0000 mg | DELAYED_RELEASE_TABLET | Freq: Every day | ORAL | Status: DC

## 2024-06-18 MED ORDER — OXYCODONE HCL 5 MG PO TABS
5.0000 mg | ORAL_TABLET | Freq: Four times a day (QID) | ORAL | 0 refills | Status: DC | PRN
  Filled 2024-06-18: qty 28, 7d supply, fill #0

## 2024-06-18 MED ORDER — ADULT MULTIVITAMIN W/MINERALS CH: 1.0000 | ORAL_TABLET | Freq: Every day | ORAL | Status: DC

## 2024-06-18 MED ORDER — IPRATROPIUM-ALBUTEROL 0.5-2.5 (3) MG/3ML IN SOLN: 3.0000 mL | Freq: Four times a day (QID) | RESPIRATORY_TRACT | Status: DC | PRN

## 2024-06-18 MED FILL — Magnesium Sulfate Inj 50%: INTRAMUSCULAR | Qty: 10 | Status: AC

## 2024-06-18 MED FILL — Sodium Chloride IV Soln 0.9%: INTRAVENOUS | Qty: 2000 | Status: AC

## 2024-06-18 MED FILL — Heparin Sodium (Porcine) Inj 1000 Unit/ML: Qty: 1000 | Status: AC

## 2024-06-18 MED FILL — Heparin Sodium (Porcine) Inj 1000 Unit/ML: INTRAMUSCULAR | Qty: 10 | Status: AC

## 2024-06-18 MED FILL — Potassium Chloride Inj 2 mEq/ML: INTRAVENOUS | Qty: 40 | Status: AC

## 2024-06-18 MED FILL — Mannitol IV Soln 20%: INTRAVENOUS | Qty: 500 | Status: AC

## 2024-06-18 MED FILL — Electrolyte-R (PH 7.4) Solution: INTRAVENOUS | Qty: 4000 | Status: AC

## 2024-06-18 MED FILL — Sodium Bicarbonate IV Soln 8.4%: INTRAVENOUS | Qty: 100 | Status: AC

## 2024-06-18 MED FILL — Nitroglycerin IV Soln 100 MCG/ML in D5W: INTRAVENOUS | Qty: 250 | Status: AC

## 2024-06-18 NOTE — TOC Transition Note (Signed)
 Transition of Care (TOC) - Discharge Note Rayfield Gobble RN, BSN Inpatient Care Management Unit 4E- RN Case Manager See Treatment Team for direct phone #   Patient Details  Name: Jacob Black MRN: [id number] Date of Birth: 26-Jan-1961  Transition of Care [facility]) CM/SW Contact:  Gobble Rayfield Hurst, RN Phone Number: 06/18/2024, 11:12 AM   Clinical Narrative:    Pt stable for transition home today, Pt has RW for home if needed.  HH has been set up with [facility]- liaison notified for start of care.   IP CM interventions have been completed no further needs noted.   Final next level of care: Home w Home Health Services Barriers to Discharge: No Barriers Identified   Patient Goals and CMS Choice Patient states their goals for this hospitalization and ongoing recovery are:: return home   Choice offered to / list presented to : Patient      Discharge Placement                 Home w/ [facility]      Discharge Plan and Services Additional resources added to the After Visit Summary for     Discharge Planning Services: CM Consult Post Acute Care Choice: Durable Medical Equipment, Home Health          DME Arranged: Walker rolling, Patient refused services DME Agency: NA       HH Arranged: PT, OT HH Agency: [facility] Date HH Agency Contacted: 06/17/24 Time HH Agency Contacted: 1431 Representative spoke with at [facility] Agency: Zebedee  Social Drivers of Health (SDOH) Interventions SDOH Screenings   Food Insecurity: No Food Insecurity (06/10/2024)  Housing: Low Risk  (06/10/2024)  Transportation Needs: No Transportation Needs (06/10/2024)  Utilities: Not At Risk (06/10/2024)  Social Connections: Unknown (06/10/2024)  Tobacco Use: High Risk (06/13/2024)     Readmission Risk Interventions    06/18/2024   11:12 AM  Readmission Risk Prevention Plan  Transportation Screening Complete  Home Care Screening Complete  Medication Review (RN CM) Complete

## 2024-06-18 NOTE — Anesthesia Postprocedure Evaluation (Signed)
 Anesthesia Post Note  Patient: Jacob Black  Procedure(s) Performed: CORONARY ARTERY BYPASS GRAFTING (CABG) X FOUR USING LEFT INTERNAL MAMMARY ARTERY AND BILATERAL GREATER SAPHENOUS VEIN HARVESTED ENDOSCOPICALLY (Chest) ECHOCARDIOGRAM, TRANSESOPHAGEAL, INTRAOPERATIVE     Patient location during evaluation: ICU Anesthesia Type: General Level of consciousness: sedated Pain management: pain level controlled Vital Signs Assessment: post-procedure vital signs reviewed and stable Respiratory status: patient remains intubated per anesthesia plan Cardiovascular status: stable Postop Assessment: no apparent nausea or vomiting Anesthetic complications: no   No notable events documented.               Kataleyah Carducci

## 2024-06-18 NOTE — Plan of Care (Signed)
   Problem: Health Behavior/Discharge Planning: Goal: Ability to manage health-related needs will improve Outcome: Progressing   Problem: Clinical Measurements: Goal: Will remain free from infection Outcome: Progressing Goal: Diagnostic test results will improve Outcome: Progressing

## 2024-06-18 NOTE — Progress Notes (Addendum)
 DISCHARGE NOTE HOME Jacob Black to be discharged Home per MD order.  Floor nurse Discussed prescriptions and follow up appointments with the patient. Prescriptions given to patient; medication list explained in detail. Patient verbalized understanding.  Skin clean, dry and intact without evidence of skin break down, no evidence of skin tears noted. IV catheter discontinued intact. Site without signs and symptoms of complications. Dressing and pressure applied. Pt denies pain at the site currently. No complaints noted.  See Lda for for surgical wounds at discharge Patient free of lines, drains, and wounds.   An After Visit Summary (AVS) was printed and given to the patient.  Patient knows to stop by pha;rmacy for medicaitons. Patient escorted via wheelchair, and discharged home via private auto.  Peyton SHAUNNA Pepper, RN

## 2024-06-18 NOTE — Progress Notes (Addendum)
                  [address]           Jacob Black [place], [name] [address]                     [phone]        5 Days Post-Op Procedure(s) (LRB): CORONARY ARTERY BYPASS GRAFTING (CABG) X FOUR USING LEFT INTERNAL MAMMARY ARTERY AND BILATERAL GREATER SAPHENOUS VEIN HARVESTED ENDOSCOPICALLY (N/A) ECHOCARDIOGRAM, TRANSESOPHAGEAL, INTRAOPERATIVE (N/A)  Subjective: Patient stated he had a bit of a rough night;he required a breathing treatment (history of tobacco abuse). He had a large bowel movement and he has no complaints this am  Objective: Vital signs in last 24 hours: Temp:  [97.9 F (36.6 C)-98.4 F (36.9 C)] 98.4 F (36.9 C) (10/16 0426) Pulse Rate:  [72-76] 72 (10/16 0426) Cardiac Rhythm: Normal sinus rhythm (10/15 2200) Resp:  [16-18] 18 (10/16 0426) BP: (108-124)/(60-74) 108/68 (10/16 0426) SpO2:  [95 %-100 %] 98 % (10/16 0426) Weight:  [89.4 kg] 89.4 kg (10/16 0500)  Pre op weight 89.8 kg Current Weight  06/18/24 89.4 kg      Intake/Output from previous day: 10/15 0701 - 10/16 0700 In: 960 [P.O.:960] Out: 0    Physical Exam:  Cardiovascular: RRR Pulmonary: Clear to auscultation bilaterally Abdomen: Soft, non tender, bowel sounds present. Extremities: Trace bilateral lower extremity edema. Wounds: Clean and dry.  No erythema or signs of infection.  Lab Results: CBC:No results for input(s): WBC, HGB, HCT, PLT in the last 72 hours. BMET:  Recent Labs    06/17/24 0313 06/18/24 0342  NA 133* 133*  K 4.2 4.0  CL 97* 98  CO2 25 24  GLUCOSE 110* 98  BUN 26* 28*  CREATININE 1.43* 1.50*  CALCIUM 8.5* 8.6*    PT/INR:  Lab Results  Component Value Date   INR 1.4 (H) 06/13/2024   INR 1.01 07/05/2015   INR 1.08 04/25/2015   ABG:  INR: Will add last result for INR, ABG once components are confirmed Will add last 4 CBG results once components are confirmed  Assessment/Plan: 1. CV - S/p STEMI. Maintaining SR, first degree heart  block. On Lopressor 25 mg bid and Plavix 75 mg daily,  2.  Pulmonary - On room air. Encourage incentive spirometer. 3. Above pre op weight, requires further diuresis-on Torsemide 40 mg daily. He is almost at pre op weight so as discussed with Dr. Lucas, stop Torsemide. 4.  Expected post op acute blood loss anemia - Last H and H stable at 11 and 32.7 5. Mild hyponatremia-has had post op. Sodium this am stable at 133 6. Creatinine this am 1.5. Creatinine at admission 1.18. Likely secondary to diuresis. Will stop diuresis 7. History of ETOH abuse-CIWA precautions, no signs of withdrawal. He has been on MVI, thiamine, and folic acid 8. On Lovenox  for DVT prophylaxis 9. Disposition-Discharge. Chest tube sutures to remain. Will be removed at office appointment  [place] M ZimmermanPA-C 7:00 AM

## 2024-06-19 ENCOUNTER — Telehealth (HOSPITAL_COMMUNITY): Payer: Self-pay

## 2024-06-19 ENCOUNTER — Other Ambulatory Visit: Payer: Self-pay | Admitting: Surgery

## 2024-06-19 ENCOUNTER — Telehealth: Payer: Self-pay

## 2024-06-19 DIAGNOSIS — Z951 Presence of aortocoronary bypass graft: Secondary | ICD-10-CM | POA: Diagnosis not present

## 2024-06-19 DIAGNOSIS — Z48812 Encounter for surgical aftercare following surgery on the circulatory system: Secondary | ICD-10-CM | POA: Diagnosis not present

## 2024-06-19 NOTE — Telephone Encounter (Signed)
 FMLA/STD form completed and faxed to [facility] [employer] @ [phone]/ Beginning LOA 06/09/24 through 09/07/24. /DOS 06/13/24

## 2024-06-19 NOTE — Telephone Encounter (Signed)
 Pt insurance is active and benefits verified through [employer] Co-pay 0, DED $8,000/$7,448.28 met, out of pocket 0/0 met, co-insurance 0%. no pre-authorization required for TCR ONLY, 06/19/24@12 :13, REF# [id number]  2ndary insurance is active and benefits verified through [facility]. Co-pay $25, DED 0/0 met, out of pocket $3,000/$402.02 met, co-insurance 0%. No pre-authorization required, [place]/[employer] 06/19/2024@1 :59, REF# [id number]  TCR/ICR? TCR  Visit(date of service)limitation? 36 visit Can multiple codes be used on the same date of service/visit?(IF ITS A LIMIT) yes  Is this a lifetime maximum or an annual maximum? annual Has the member used any of these services to date? no Is there a time limit (weeks/months) on start of program and/or program completion? no  Will contact patient to see if he is interested in the Cardiac Rehab Program. If interested, patient will need to complete follow up appt. Once completed, patient will be contacted for scheduling upon review by the RN Navigator.

## 2024-06-19 NOTE — Telephone Encounter (Signed)
 Called patient to see if he is interested in the Cardiac Rehab Program. Patient expressed interest. Explained scheduling process and went over insurance, patient verbalized understanding. Will contact patient for scheduling once f/u has been completed.

## 2024-06-19 NOTE — Telephone Encounter (Signed)
 Pt insurance is active and benefits verified through [employer] Co-pay 0, DED $8,000/$7,448.28 met, out of pocket 0/0 met, co-insurance 0%. no pre-authorization required FOR TCR, [place]/[employer] 06/19/2024@12 :13, REF# [id number]   TCR/ICR? TCR Visit(date of service)limitation? 36 visits Can multiple codes be used on the same date of service/visit?(IF ITS A LIMIT) yes    Is this a lifetime maximum or an annual maximum? annual Has the member used any of these services to date? no Is there a time limit (weeks/months) on start of program and/or program completion? no     Will contact patient to see if he is interested in the Cardiac Rehab Program. If interested, patient will need to complete follow up appt. Once completed, patient will be contacted for scheduling upon review by the RN Navigator.

## 2024-06-22 NOTE — Progress Notes (Unsigned)
 [address] [place] [address]             [phone]       HPI:  Jacob Black is a 63 year old man with medical history STEMI, chronic hepatitis C, gout and hypercholesteremia who returns for routine postoperative follow-up having undergone Coronary artery bypass grafting x 4, Left internal mammary artery graft to the LAD, SVG to diagonal, SVG to OM1, SVG to PDA and endoscopic vein harvest from the both legs on 06/13/2024 with Dr. Lucas.  The patient's early postoperative recovery while in the hospital was notable for starting plavix for STEMI. He was routinely diuresed postoperatively. Lopressor was titrated as able. Diuresis was discontinued since he was almost at his pre operative weight and had increase in creatinine.  He was stable for discharge home on 06/18/2024.   Since hospital discharge the patient reports that he has been doing well.  He has been taking tylenol  and oxycodone for the pain which has been helping.  He has been walking throughout the day.  Attempting to do 5 minute intervals.  He has not smoked since surgery and has been doing well with smoking cessation.  He denies chest pain, shortness of breath and lower leg swelling.    Allergies as of 06/23/2024   No Known Allergies      Medication List        Accurate as of June 23, 2024  4:17 PM. If you have any questions, ask your nurse or doctor.          acetaminophen  325 MG tablet Commonly known as: Tylenol  Take 2 tablets (650 mg total) by mouth every 6 (six) hours as needed for mild pain (pain score 1-3).   allopurinol 300 MG tablet Commonly known as: ZYLOPRIM Take 300 mg by mouth daily.   aspirin EC 81 MG tablet Take 1 tablet (81 mg total) by mouth daily. Swallow whole.   clopidogrel 75 MG tablet Commonly known as: PLAVIX Take 1 tablet (75 mg total) by mouth daily.   colchicine  0.6 MG tablet Take 0.6-1.2 mg by mouth 2 (two) times daily as needed (gout flare).    folic acid 1 MG tablet Commonly known as: FOLVITE Take 1 tablet (1 mg total) by mouth daily.   metoprolol tartrate 25 MG tablet Commonly known as: LOPRESSOR Take 1 tablet (25 mg total) by mouth 2 (two) times daily.   multivitamin with minerals Tabs tablet Take 1 tablet by mouth daily.   oxyCODONE 5 MG immediate release tablet Commonly known as: Oxy IR/ROXICODONE Take 1 tablet (5 mg total) by mouth every 6 (six) hours as needed for severe pain (pain score 7-10).   rosuvastatin 40 MG tablet Commonly known as: CRESTOR Take 1 tablet (40 mg total) by mouth daily.   thiamine 100 MG tablet Commonly known as: Vitamin B-1 Take 1 tablet (100 mg total) by mouth daily.         ROS Review of Systems  Constitutional:  Positive for malaise/fatigue.  Respiratory: Negative.  Negative for cough and shortness of breath.   Cardiovascular: Negative.  Negative for chest pain, palpitations and leg swelling.      BP 121/75   Pulse 76   Resp 20   Ht 5' 9 (1.753 m)   Wt 197 lb (89.4 kg)   SpO2 99% Comment: RA  BMI 29.09 kg/m   Physical Exam Constitutional:      Appearance: Normal  appearance.  HENT:     Head: Normocephalic and atraumatic.  Cardiovascular:     Rate and Rhythm: Normal rate and regular rhythm.     Heart sounds: Normal heart sounds, S1 normal and S2 normal.  Pulmonary:     Effort: Pulmonary effort is normal.     Breath sounds: Normal breath sounds.  Skin:    General: Skin is warm and dry.      Neurological:     General: No focal deficit present.     Mental Status: He is alert and oriented to person, place, and time.       Imaging: CLINICAL DATA:  CABG x4.   EXAM: CHEST - 2 VIEW   COMPARISON:  06/16/2024   FINDINGS: Sternotomy wires unchanged. Lungs are hypoinflated without acute airspace process or effusion. Cardiomediastinal silhouette and remainder of the exam is unchanged.   IMPRESSION: Hypoinflation without acute cardiopulmonary disease.      Electronically Signed   By: Toribio Agreste M.D.   On: 06/23/2024 15:32   Assessment/Plan:  S/P CABG x 4 -We reviewed today's chest x ray.  Continue to use incentive spirometer -He should continue to increase his activity/exercise as tolerated -Discussed continuance of sternal precautions until a full 6 weeks from surgery -Continue to keep incision sites clean and dry -Continue to use oxycodone and Tylenol  as needed for pain.  He states his oxycodone prescription is almost out and if he requires more he can reach back out to the clinic - He has follow-up with cardiology on 07-08-2024   Manuelita CHRISTELLA Rough, PA-C 4:17 PM 06/23/24

## 2024-06-23 ENCOUNTER — Ambulatory Visit (HOSPITAL_COMMUNITY)
Admission: RE | Admit: 2024-06-23 | Discharge: 2024-06-23 | Disposition: A | Discharge status: Home / Self Care | Source: Ambulatory Visit | Attending: Internal Medicine | Admitting: Internal Medicine

## 2024-06-23 ENCOUNTER — Ambulatory Visit: Payer: Self-pay

## 2024-06-23 VITALS — BP 121/75 | HR 76 | Resp 20 | Ht 69.0 in | Wt 197.0 lb

## 2024-06-23 DIAGNOSIS — Z951 Presence of aortocoronary bypass graft: Secondary | ICD-10-CM

## 2024-06-23 LAB — ECHO INTRAOPERATIVE TEE
AV Mean grad: 7 mmHg
AV Peak grad: 12.5 mmHg
Ao pk vel: 1.77 m/s
Height: 69 in
P 1/2 time: 647 ms
Weight: 3166.4 [oz_av]

## 2024-06-23 NOTE — Patient Instructions (Signed)
-  Follow up with [facility] in 3 weeks with chest xray

## 2024-06-26 ENCOUNTER — Other Ambulatory Visit: Payer: Self-pay

## 2024-06-26 MED ORDER — OXYCODONE HCL 5 MG PO TABS: 5.0000 mg | ORAL_TABLET | Freq: Four times a day (QID) | ORAL | 0 refills | Status: AC | PRN

## 2024-06-26 NOTE — Progress Notes (Signed)
-  Refilled oxycodone at this time for pain -Continue to use tylenol

## 2024-07-05 NOTE — Progress Notes (Unsigned)
 Cardiology Office Note:    Date:  07/08/2024   ID:  Jacob Black, DOB July 06, 1961, MRN [id number]  PCP:  Jacob Almarie JONELLE, MD   [facility] Providers Cardiologist:  Jacob Bergamo, MD     Referring MD: Jacob Almarie JONELLE, MD   Chief Complaint  Patient presents with   Follow-up    Post CABG    History of Present Illness:    Jacob Black is a 63 y.o. male with a hx of CAD with recent STEMI 06/2024 leading to CABG x 4, HTN, HLD, tobacco abuse, chronic hepatitis C, gout, and alcohol use.   He presented with inferior STEMI 06/09/24. LHC demonstrated severe unstable RCA disease treated with POBA. Due to multivessel disease, he was referred for CABG. IABP placed and he was started on cangrelor. 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 Formal echo with LVEF 50-55% with degenerative mitral valve without MS/MR and mild AI. He underwent CABG x 4 on 06/13/24 with LIMA-LAD, SVG-OM, SVG-PDA, and SVG to diagonal. His post-op course was generally uneventful, discharged on 06/18/24.  He presents for cardiology follow up. He reports a rash between buttocks. Both parents died of MI's in their 11s. Unclear why he presents in a wheelchair. 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 Recommended avoiding boating for now given sternal precautions and has not been released to drive, still taking oxycodone. Encouraged cardiac rehab.     Past Medical History:  Diagnosis Date   Gout    Hyperlipidemia     Past Surgical History:  Procedure Laterality Date   CORONARY ARTERY BYPASS GRAFT N/A 06/13/2024   Procedure: CORONARY ARTERY BYPASS GRAFTING (CABG) X FOUR USING LEFT INTERNAL MAMMARY ARTERY AND BILATERAL GREATER SAPHENOUS VEIN HARVESTED ENDOSCOPICALLY;  Surgeon: Jacob Dorise POUR, MD;  Location: [facility] OR;   Service: Open Heart Surgery;  Laterality: N/A;   CORONARY/GRAFT ACUTE MI REVASCULARIZATION N/A 06/09/2024   Procedure: Coronary/Graft Acute MI Revascularization;  Surgeon: Black Gordy, MD;  Location: [facility] INVASIVE CV LAB;  Service: Cardiovascular;  Laterality: N/A;   FRACTURE SURGERY     INTRAOPERATIVE TRANSESOPHAGEAL ECHOCARDIOGRAM N/A 06/13/2024   Procedure: ECHOCARDIOGRAM, TRANSESOPHAGEAL, INTRAOPERATIVE;  Surgeon: Jacob Dorise POUR, MD;  Location: [facility] OR;  Service: Open Heart Surgery;  Laterality: N/A;   LEG SURGERY      Current Medications: Current Meds  Medication Sig   acetaminophen  (TYLENOL ) 325 MG tablet Take 2 tablets (650 mg total) by mouth every 6 (six) hours as needed for mild pain (pain score 1-3).   allopurinol (ZYLOPRIM) 300 MG tablet Take 300 mg by mouth daily.   aspirin EC 81 MG tablet Take 1 tablet (81 mg total) by mouth daily. Swallow whole.   clopidogrel (PLAVIX) 75 MG tablet Take 1 tablet (75 mg total) by mouth daily.   colchicine  0.6 MG tablet Take 0.6-1.2 mg by mouth 2 (two) times daily as needed (gout flare).   folic acid (FOLVITE) 1 MG tablet Take 1 tablet (1 mg total) by mouth daily.   metoprolol tartrate (LOPRESSOR) 25 MG tablet Take 1 tablet (25 mg total) by mouth 2 (two) times daily.   Multiple Vitamin (MULTIVITAMIN WITH MINERALS) TABS tablet Take 1 tablet by mouth daily.   nystatin-triamcinolone ointment (MYCOLOG) Apply 1 Application topically 2 (two) times daily.   oxyCODONE (OXY IR/ROXICODONE) 5  MG immediate release tablet Take 5 mg by mouth every 4 (four) hours as needed for severe pain (pain score 7-10).   rosuvastatin (CRESTOR) 40 MG tablet Take 1 tablet (40 mg total) by mouth daily.   thiamine (VITAMIN B-1) 100 MG tablet Take 1 tablet (100 mg total) by mouth daily.     Allergies:   Patient has no known allergies.   Social History   Socioeconomic History   Marital status: Married    Spouse name: Not on file   Number of children: Not on file   Years of  education: Not on file   Highest education level: Not on file  Occupational History   Not on file  Tobacco Use   Smoking status: Former    Current packs/day: 0.00    Average packs/day: 0.5 packs/day for 49.8 years (24.9 ttl pk-yrs)    Types: Cigarettes    Start date: 09/03/1974    Quit date: 06/09/2024    Years since quitting: 0.0   Smokeless tobacco: Never  Vaping Use   Vaping status: Never Used  Substance and Sexual Activity   Alcohol use: Yes    Comment: occasionally   Drug use: Never   Sexual activity: Not on file  Other Topics Concern   Not on file  Social History Narrative   ** Merged History Encounter **       Social Drivers of Health   Financial Resource Strain: Not on file  Food Insecurity: No Food Insecurity (06/10/2024)   Hunger Vital Sign    Worried About Running Out of Food in the Last Year: Never true    Ran Out of Food in the Last Year: Never true  Transportation Needs: No Transportation Needs (06/10/2024)   PRAPARE - Administrator, Civil Service (Medical): No    Lack of Transportation (Non-Medical): No  Physical Activity: Not on file  Stress: Not on file  Social Connections: Unknown (06/10/2024)   Social Connection and Isolation Panel    Frequency of Communication with Friends and Family: More than three times a week    Frequency of Social Gatherings with Friends and Family: More than three times a week    Attends Religious Services: Patient declined    Database Administrator or Organizations: Patient declined    Attends Banker Meetings: Patient declined    Marital Status: Married     Family History: The patient's family history includes Diabetes in his father and mother; Heart attack in his father; Heart disease in his father and mother; Stroke (age of onset: 95) in his mother.  ROS:   Please see the history of present illness.     All other systems reviewed and are negative.  EKGs/Labs/Other Studies Reviewed:    The  following studies were reviewed today:  EKG Interpretation Date/Time:  Wednesday July 08 2024 09:59:14 EST Ventricular Rate:  75 PR Interval:  216 QRS Duration:  76 QT Interval:  352 QTC Calculation: 393 R Axis:   104  Text Interpretation: Sinus rhythm with 1st degree A-V block Rightward axis T wave abnormality, consider inferior ischemia When compared with ECG of 14-Jun-2024 08:07, Sinus rhythm has replaced Electronic ventricular pacemaker Confirmed by Madie Slough ([id number]) on 07/08/2024 10:10:19 AM    Recent Labs: 06/10/2024: TSH 2.260 06/14/2024: Magnesium  2.2 06/15/2024: Hemoglobin 11.0; Platelets 161 06/18/2024: BUN 28; Creatinine, Ser 1.50; Potassium 4.0; Sodium 133  Recent Lipid Panel    Component Value Date/Time   CHOL 202 (H) 06/10/2024  0331   TRIG 254 (H) 06/10/2024 0331   HDL 40 (L) 06/10/2024 0331   CHOLHDL 5.1 06/10/2024 0331   VLDL 51 (H) 06/10/2024 0331   LDLCALC 111 (H) 06/10/2024 0331     Risk Assessment/Calculations:                Physical Exam:    VS:  BP 104/66   Pulse 75   Ht 5' 9 (1.753 m)   Wt 191 lb (86.6 kg)   SpO2 98%   BMI 28.21 kg/m     Wt Readings from Last 3 Encounters:  07/08/24 191 lb (86.6 kg)  06/23/24 197 lb (89.4 kg)  06/18/24 197 lb 1.5 oz (89.4 kg)     GEN: Well nourished, well developed in no acute distress HEENT: Normal NECK: No JVD; No carotid bruits LYMPHATICS: No lymphadenopathy CARDIAC: RRR, no murmurs, rubs, gallops, sternotomy healing well RESPIRATORY:  Clear to auscultation without rales, wheezing or rhonchi  ABDOMEN: Soft, non-tender, non-distended MUSCULOSKELETAL:  No edema; No deformity  SKIN: minor skin breakdown in gluteal folds with evidence of yeast, does not appear consistent with secondary bacterial infection NEUROLOGIC:  Alert and oriented x 3 PSYCHIATRIC:  Normal affect   ASSESSMENT:    1. Acute ST elevation myocardial infarction (STEMI) of inferior wall (HCC)   2. Triple vessel coronary  artery disease   3. S/P CABG x 4   4. Hypercholesteremia   5. Yeast dermatitis   6. Hyperlipidemia with target LDL less than 70   7. Tobacco abuse   8. Alcohol use    PLAN:    In order of problems listed above:  CAD CABG x 4: LIMA-LAD, SVG-OM, SVG-PDA, SVG to diagonal - 06/13/24 - remains on ASA and plavix, POBA to RCA prior to CABG - will continue DAPT x 12 months - continue 25 mg lopressor BID - no angina, expected chest soreness   Hyperlipidemia with LDL goal < 55 06/10/2024: Cholesterol 202; HDL 40; LDL Cholesterol 111; Triglycerides 254; VLDL 51 Discharged on 40 mg crestor - suspect genetic component - will refer to pharmD for PCSK9i - will collect LPA   Tobacco abuse - cessation recommended - he quit and has not restarted - he states he was told he can't restart until  he is healed from surgery - It reiterated the importance of not restarting smoking   Alcohol use - given DAPT, recommend reduction in intake - he has cut back - 2 drinks yesterday which coincided with collection from football gambling - recommended gum for celebrating instead of alcohol   AKI - collect BMP today   Skin rash - yeast dermatitis - appears to be some skin breakdown + yeast in gluteal folds  - I prescribed triamcinolone-nystatin and desitin BID, advised to follow up with PCP who may recommend different treatment   Follow up in 3 months.      Cardiac Rehabilitation Eligibility Assessment  The patient is ready to start cardiac rehabilitation pending clearance from the cardiac surgeon.          Medication Adjustments/Labs and Tests Ordered: Current medicines are reviewed at length with the patient today.  Concerns regarding medicines are outlined above.  Orders Placed This Encounter  Procedures   Lipoprotein A (LPA)   Basic Metabolic Panel (BMET)   AMB Referral to [facility] Pharm-D   EKG 12-Lead   Meds ordered this encounter  Medications   nystatin-triamcinolone  ointment (MYCOLOG)    Sig: Apply 1 Application topically 2 (two)  times daily.    Dispense:  30 g    Refill:  0    There are no Patient Instructions on file for this visit.   Signed, Jon Nat Hails, [place]  07/08/2024 10:52 AM    [facility]

## 2024-07-08 ENCOUNTER — Ambulatory Visit: Attending: Cardiology | Admitting: Physician Assistant

## 2024-07-08 ENCOUNTER — Encounter: Payer: Self-pay | Admitting: Physician Assistant

## 2024-07-08 VITALS — BP 104/66 | HR 75 | Ht 69.0 in | Wt 191.0 lb

## 2024-07-08 DIAGNOSIS — E7800 Pure hypercholesterolemia, unspecified: Secondary | ICD-10-CM | POA: Diagnosis not present

## 2024-07-08 DIAGNOSIS — E785 Hyperlipidemia, unspecified: Secondary | ICD-10-CM

## 2024-07-08 DIAGNOSIS — Z720 Tobacco use: Secondary | ICD-10-CM

## 2024-07-08 DIAGNOSIS — I2119 ST elevation (STEMI) myocardial infarction involving other coronary artery of inferior wall: Secondary | ICD-10-CM

## 2024-07-08 DIAGNOSIS — B372 Candidiasis of skin and nail: Secondary | ICD-10-CM

## 2024-07-08 DIAGNOSIS — I2510 Atherosclerotic heart disease of native coronary artery without angina pectoris: Secondary | ICD-10-CM

## 2024-07-08 DIAGNOSIS — F1090 Alcohol use, unspecified, uncomplicated: Secondary | ICD-10-CM

## 2024-07-08 DIAGNOSIS — Z951 Presence of aortocoronary bypass graft: Secondary | ICD-10-CM

## 2024-07-08 MED ORDER — NYSTATIN-TRIAMCINOLONE 100000-0.1 UNIT/GM-% EX OINT: 1.0000 | TOPICAL_OINTMENT | Freq: Two times a day (BID) | CUTANEOUS | 0 refills | Status: DC

## 2024-07-08 NOTE — Patient Instructions (Addendum)
 Medication Instructions:  - Apply Mycolog ointment and Desitin ointment to affected area twice daily.  *If you need a refill on your cardiac medications before your next appointment, please call your pharmacy*  Lab Work: - LP(a) and BMET ordered today  If you have labs (blood work) drawn today and your tests are completely normal, you will receive your results only by: MyChart Message (if you have MyChart) OR A paper copy in the mail If you have any lab test that is abnormal or we need to change your treatment, we will call you to review the results.  Testing/Procedures: - None ordered  Follow-Up: At [facility], you and your health needs are our priority.  As part of our continuing mission to provide you with exceptional heart care, our providers are all part of one team.  This team includes your primary Cardiologist (physician) and Advanced Practice Providers or APPs (Physician Assistants and Nurse Practitioners) who all work together to provide you with the care you need, when you need it.  Your next appointment:   3 month(s)  Provider:   Gordy Bergamo, MD    We recommend signing up for the patient portal called MyChart.  Sign up information is provided on this After Visit Summary.  MyChart is used to connect with patients for Virtual Visits (Telemedicine).  Patients are able to view lab/test results, encounter notes, upcoming appointments, etc.  Non-urgent messages can be sent to your provider as well.   To learn more about what you can do with MyChart, go to [email or web address].

## 2024-07-09 LAB — BASIC METABOLIC PANEL WITH GFR
BUN/Creatinine Ratio: 20 (ref 10–24)
BUN: 23 mg/dL (ref 8–27)
CO2: 22 mmol/L (ref 20–29)
Calcium: 9.8 mg/dL (ref 8.6–10.2)
Chloride: 104 mmol/L (ref 96–106)
Creatinine, Ser: 1.15 mg/dL (ref 0.76–1.27)
Glucose: 89 mg/dL (ref 70–99)
Potassium: 4.9 mmol/L (ref 3.5–5.2)
Sodium: 141 mmol/L (ref 134–144)
eGFR: 73 mL/min/1.73 (ref >59)

## 2024-07-09 LAB — LIPOPROTEIN A (LPA): Lipoprotein (a): 233.5 nmol/L — ABNORMAL HIGH (ref <75.0)

## 2024-07-10 ENCOUNTER — Ambulatory Visit: Payer: Self-pay | Admitting: Physician Assistant

## 2024-07-10 DIAGNOSIS — E785 Hyperlipidemia, unspecified: Secondary | ICD-10-CM

## 2024-07-13 ENCOUNTER — Other Ambulatory Visit: Payer: Self-pay | Admitting: Surgery

## 2024-07-13 DIAGNOSIS — Z951 Presence of aortocoronary bypass graft: Secondary | ICD-10-CM

## 2024-07-14 ENCOUNTER — Ambulatory Visit (HOSPITAL_COMMUNITY)
Admission: RE | Admit: 2024-07-14 | Discharge: 2024-07-14 | Disposition: A | Discharge status: Home / Self Care | Source: Ambulatory Visit | Attending: Cardiovascular Disease | Admitting: Cardiovascular Disease

## 2024-07-14 ENCOUNTER — Ambulatory Visit

## 2024-07-14 ENCOUNTER — Telehealth: Payer: Self-pay | Admitting: Physician Assistant

## 2024-07-14 VITALS — BP 130/80 | HR 76 | Resp 18 | Ht 69.0 in | Wt 192.0 lb

## 2024-07-14 DIAGNOSIS — Z951 Presence of aortocoronary bypass graft: Secondary | ICD-10-CM | POA: Diagnosis present

## 2024-07-14 MED ORDER — METOPROLOL TARTRATE 25 MG PO TABS: 25.0000 mg | ORAL_TABLET | Freq: Two times a day (BID) | ORAL | 0 refills | Status: DC

## 2024-07-14 MED ORDER — CLOPIDOGREL BISULFATE 75 MG PO TABS: 75.0000 mg | ORAL_TABLET | Freq: Every day | ORAL | 0 refills | Status: DC

## 2024-07-14 MED ORDER — ROSUVASTATIN CALCIUM 40 MG PO TABS: 40.0000 mg | ORAL_TABLET | Freq: Every day | ORAL | 0 refills | Status: DC

## 2024-07-14 NOTE — Progress Notes (Signed)
 [address] [place] [address]             [phone]       HPI:  Jacob Black is a 63 year old man with medical history STEMI, chronic hepatitis C, gout and hypercholesteremia who returns for routine postoperative follow-up having undergone Coronary artery bypass grafting x 4, Left internal mammary artery graft to the LAD, SVG to diagonal, SVG to OM1, SVG to PDA and endoscopic vein harvest from the both legs on 06/13/2024 with Dr. Lucas.  The patient's early postoperative recovery while in the hospital was notable for starting plavix for STEMI. He was routinely diuresed postoperatively. Lopressor was titrated as able. Diuresis was discontinued since he was almost at his pre operative weight and had increase in creatinine.  He was stable for discharge home on 06/18/2024.   Since last follow up he reports that he is doing well.  His pain is well controlled without the use of medications.  His incisions have healed appropriately.  He is currently having a gout flare and his PCP started him on a prednisone  taper which is helping.  Due to gout he has been attempting to stay active but it has been harder.  He is still walking in intervals. He is still smoke free.  He denies chest pain, shortness of breath and lower leg swelling.    Allergies as of 07/14/2024   No Known Allergies      Medication List        Accurate as of July 14, 2024  8:55 AM. If you have any questions, ask your nurse or doctor.          acetaminophen  325 MG tablet Commonly known as: Tylenol  Take 2 tablets (650 mg total) by mouth every 6 (six) hours as needed for mild pain (pain score 1-3).   allopurinol 300 MG tablet Commonly known as: ZYLOPRIM Take 300 mg by mouth daily.   aspirin EC 81 MG tablet Take 1 tablet (81 mg total) by mouth daily. Swallow whole.   clopidogrel 75 MG tablet Commonly known as: PLAVIX Take 1 tablet (75 mg total) by mouth daily.   colchicine  0.6  MG tablet Take 0.6-1.2 mg by mouth 2 (two) times daily as needed (gout flare).   folic acid 1 MG tablet Commonly known as: FOLVITE Take 1 tablet (1 mg total) by mouth daily.   metoprolol tartrate 25 MG tablet Commonly known as: LOPRESSOR Take 1 tablet (25 mg total) by mouth 2 (two) times daily.   multivitamin with minerals Tabs tablet Take 1 tablet by mouth daily.   nystatin-triamcinolone ointment Commonly known as: MYCOLOG Apply 1 Application topically 2 (two) times daily.   oxyCODONE 5 MG immediate release tablet Commonly known as: Oxy IR/ROXICODONE Take 5 mg by mouth every 4 (four) hours as needed for severe pain (pain score 7-10).   rosuvastatin 40 MG tablet Commonly known as: CRESTOR Take 1 tablet (40 mg total) by mouth daily.   thiamine 100 MG tablet Commonly known as: Vitamin B-1 Take 1 tablet (100 mg total) by mouth daily.         ROS Review of Systems  Constitutional: Negative.  Negative for malaise/fatigue.  Respiratory: Negative.  Negative for cough and shortness of breath.   Cardiovascular: Negative.  Negative for chest pain and leg swelling.      There were no vitals taken for this visit.  Physical Exam Constitutional:  Appearance: Normal appearance.  HENT:     Head: Normocephalic and atraumatic.  Cardiovascular:     Rate and Rhythm: Normal rate and regular rhythm.     Heart sounds: Normal heart sounds, S1 normal and S2 normal.  Pulmonary:     Effort: Pulmonary effort is normal.     Breath sounds: Normal breath sounds.  Skin:    General: Skin is warm and dry.      Neurological:     General: No focal deficit present.     Mental Status: He is alert and oriented to person, place, and time.       Imaging: EXAM: 2 VIEW(S) XRAY OF THE CHEST 07/14/2024 08:26:27 AM   COMPARISON: 06/23/2024.   CLINICAL HISTORY: Status post CABG.   FINDINGS:   LUNGS AND PLEURA: No focal pulmonary opacity. No over pulmonary edema. No pleural  effusion. No pneumothorax.   HEART AND MEDIASTINUM: Status post CABG. Intact median sternotomy. No acute abnormality of the cardiac and mediastinal silhouettes.   BONES AND SOFT TISSUES: No acute osseous abnormality.   IMPRESSION: 1. No acute cardiopulmonary findings.   Electronically signed by: Shahmeer Lateef MD 07/14/2024 08:52 AM EST RP Workstation: [id number]  Assessment/Plan:  S/P CABG x 4 -We reviewed today's chest x ray.  -We discussed driving and he is able to start since not using narcotics for pain.  First time driving should be a short distance in the day time and he can increase from there.  -Discussed participation in cardiac rehab and he is cleared from a surgical standpoint at this time -He should continue sternal precautions until a full 6 weeks from surgery. Continue to avoid lifting over 10 pounds until a full 3 months from surgery -He should continue to increase his activity/exercise as tolerated -He has been seen by cardiology.  He is to continue with aspirin, plavix, metoprolol and crestor. -Follow up with [facility] as needed   Manuelita CHRISTELLA Rough, PA-C 8:55 AM 07/14/24

## 2024-07-14 NOTE — Telephone Encounter (Signed)
*  STAT* If patient is at the pharmacy, call can be transferred to refill team.   1. Which medications need to be refilled? (please list name of each medication and dose if known) clopidogrel (PLAVIX) 75 MG tablet  metoprolol tartrate (LOPRESSOR) 25 MG tablet  rosuvastatin (CRESTOR) 40 MG tablet    2. Would you like to learn more about the convenience, safety, & potential cost savings by using the [facility]?    3. Are you open to using the [facility] (Type [facility].  ).   4. Which pharmacy/location (including street and city if local pharmacy) is medication to be sent to? [id number] - [place], [place] - [address]    5. Do they need a 30 day or 90 day supply? 90 day

## 2024-07-14 NOTE — Patient Instructions (Signed)

## 2024-07-14 NOTE — Telephone Encounter (Signed)
 Refills have been sent.

## 2024-07-21 ENCOUNTER — Encounter (HOSPITAL_COMMUNITY): Payer: Self-pay

## 2024-07-21 ENCOUNTER — Telehealth (HOSPITAL_COMMUNITY): Payer: Self-pay

## 2024-07-21 NOTE — Telephone Encounter (Signed)
 Attempted to call patient regarding scheduling cardiac rehab- no answer, left message. Mailed letter.

## 2024-08-04 ENCOUNTER — Telehealth (HOSPITAL_COMMUNITY): Payer: Self-pay

## 2024-08-04 NOTE — Telephone Encounter (Signed)
 Attempted f/u to schedule cardiac rehab- no answer, left message.  Closing referral.

## 2024-08-26 ENCOUNTER — Ambulatory Visit: Admitting: Pharmacist

## 2024-08-26 NOTE — Progress Notes (Deleted)
 Patient ID: Jacob Black                 DOB: March 04, 1961                    MRN: [id number]      HPI: Jacob Black is a 63 y.o. male patient referred to lipid clinic by Mercy Hails, PA. PMH is significant for CAD with recent STEMI 06/2024 leading to CABG x 4, HTN, HLD, tobacco abuse, chronic hepatitis C, gout, and alcohol use.   LDL-C 111 upon admission for STEMI. Started on rosuvastatin  40mg  daily. No repeat lipid panel done. LP(a) was rechecked and found to be 233.    Reviewed options for lowering LDL cholesterol, including ezetimibe, PCSK-9 inhibitors, bempedoic acid and inclisiran.  Discussed mechanisms of action, dosing, side effects and potential decreases in LDL cholesterol.  Also reviewed cost information and potential options for patient assistance.  Current Medications:  Intolerances:  Risk Factors:  LDL-C goal:  ApoB goal:   Diet:   Exercise:   Family History: Both parents died of MI's in their 34s.  The patient's family history includes Diabetes in his father and mother; Heart attack in his father; Heart disease in his father and mother; Stroke (age of onset: 31) in his mother.   Social History:   Labs: Lipid Panel     Component Value Date/Time   CHOL 202 (H) 06/10/2024 0331   TRIG 254 (H) 06/10/2024 0331   HDL 40 (L) 06/10/2024 0331   CHOLHDL 5.1 06/10/2024 0331   VLDL 51 (H) 06/10/2024 0331   LDLCALC 111 (H) 06/10/2024 0331    Past Medical History:  Diagnosis Date   Gout    Hyperlipidemia     Medications Ordered Prior to Encounter[1]  Allergies[2]  Assessment/Plan:  1. Hyperlipidemia -  No problem-specific Assessment & Plan notes found for this encounter.    Thank you,  Jacob Black A Division of [facility] [address], [place], [name] [address]  Phone: [phone]; Fax: [phone]           [1]  Current Outpatient Medications on File Prior to Visit   Medication Sig Dispense Refill   acetaminophen  (TYLENOL ) 325 MG tablet Take 2 tablets (650 mg total) by mouth every 6 (six) hours as needed for mild pain (pain score 1-3).     allopurinol  (ZYLOPRIM ) 300 MG tablet Take 300 mg by mouth daily.     aspirin  EC 81 MG tablet Take 1 tablet (81 mg total) by mouth daily. Swallow whole.     clopidogrel  (PLAVIX ) 75 MG tablet Take 1 tablet (75 mg total) by mouth daily. 90 tablet 0   colchicine  0.6 MG tablet Take 0.6-1.2 mg by mouth 2 (two) times daily as needed (gout flare).     folic acid  (FOLVITE ) 1 MG tablet Take 1 tablet (1 mg total) by mouth daily.     metoprolol  tartrate (LOPRESSOR ) 25 MG tablet Take 1 tablet (25 mg total) by mouth 2 (two) times daily. 180 tablet 0   Multiple Vitamin (MULTIVITAMIN WITH MINERALS) TABS tablet Take 1 tablet by mouth daily.     nystatin -triamcinolone  ointment (MYCOLOG) Apply 1 Application topically 2 (two) times daily. 30 g 0   oxyCODONE  (OXY IR/ROXICODONE ) 5 MG immediate release tablet Take 5 mg by mouth every 4 (four) hours as needed for severe pain (pain score 7-10). (Patient not taking: Reported on 07/14/2024)  predniSONE  (STERAPRED UNI-PAK 21 TAB) 5 MG (21) TBPK tablet Take 5 mg by mouth daily.     rosuvastatin  (CRESTOR ) 40 MG tablet Take 1 tablet (40 mg total) by mouth daily. 90 tablet 0   thiamine  (VITAMIN B-1) 100 MG tablet Take 1 tablet (100 mg total) by mouth daily.     [DISCONTINUED] albuterol  (PROVENTIL  HFA;VENTOLIN  HFA) 108 (90 Base) MCG/ACT inhaler Inhale 2 puffs into the lungs every 6 (six) hours as needed for wheezing or shortness of breath. 1 Inhaler 0   [DISCONTINUED] febuxostat  (ULORIC ) 40 MG tablet Take 40 mg by mouth daily.     No current facility-administered medications on file prior to visit.  [2] No Known Allergies

## 2024-09-04 ENCOUNTER — Telehealth (HOSPITAL_COMMUNITY): Payer: Self-pay

## 2024-09-04 ENCOUNTER — Telehealth: Payer: Self-pay | Admitting: Physician Assistant

## 2024-09-04 NOTE — Telephone Encounter (Signed)
 Pt still losing energy fast. He also needs a Dr.Note for return to work on 1/6. Please advise.

## 2024-09-04 NOTE — Telephone Encounter (Signed)
 Pt returned phone call and stated that he called back and that he spoke with someone to reopen his referral because he is interested in the cardiac rehab program. I advised pt that I will reopen his referral and check his insurance benefits and give him a call back next week to get him scheduled.

## 2024-09-04 NOTE — Telephone Encounter (Signed)
 It will take some time 2-3 months, hope he is enrolled in cardiac rehab. I also sent him a link for mychart

## 2024-09-04 NOTE — Telephone Encounter (Signed)
 Pt states that he doesn't think he can return to work yet, still tired and no energy since procedure. Asking for a note to be out of work until 1/6. Pt states they thought they called Dr Harden office, not Dr Magdaleno. Pt will call Dr Lucas for further advisement.

## 2024-09-07 ENCOUNTER — Encounter: Payer: Self-pay | Admitting: *Deleted

## 2024-09-08 ENCOUNTER — Telehealth (HOSPITAL_COMMUNITY): Payer: Self-pay

## 2024-09-08 NOTE — Telephone Encounter (Signed)
 Pt insurance is active and benefits verified through [employer] Co-pay 0, DED $8,000/$0 met, out of pocket 0/0 met, co-insurance 0%. no pre-authorization required for TCR ONLY, John/[employer] 09/08/24@11 :34, REF# [id number]   2ndary insurance is active and benefits verified through [facility]. Co-pay 0, DED $6,500/$0 met, out of pocket $3,000/$44.86 met, co-insurance 0%. No pre-authorization required. , Kundan/[employer] 09/08/2024@12 :16, REF# [id number]   TCR/ICR? TCR  Visit(date of service)limitation? 36 visits Can multiple codes be used on the same date of service/visit?(IF ITS A LIMIT) yes    Is this a lifetime maximum or an annual maximum? annual Has the member used any of these services to date? no Is there a time limit (weeks/months) on start of program and/or program completion? no

## 2024-09-09 ENCOUNTER — Telehealth (HOSPITAL_COMMUNITY): Payer: Self-pay

## 2024-09-09 ENCOUNTER — Encounter (HOSPITAL_COMMUNITY): Payer: Self-pay

## 2024-09-09 NOTE — Telephone Encounter (Signed)
 Pt returned phone call and is interested in scheduling cardiac rehab. Pt will come in for orientation on 09/15/24@8am  and will attend the 8:15am class time.  Mailed packet

## 2024-09-09 NOTE — Telephone Encounter (Signed)
 Attempted to schedule cardiac rehab- no answer, left message. Mailed letter.

## 2024-09-10 ENCOUNTER — Encounter (HOSPITAL_COMMUNITY): Payer: Self-pay

## 2024-09-10 ENCOUNTER — Telehealth (HOSPITAL_COMMUNITY): Payer: Self-pay

## 2024-09-10 NOTE — Telephone Encounter (Signed)
 Confirmed cardiac orientation appointment time of 09/15/24 at 0800.  Pt instructed on location, what to bring, footwear, and approximate length of orientation, and cardiac health history completed.

## 2024-09-11 ENCOUNTER — Telehealth (HOSPITAL_COMMUNITY): Payer: Self-pay

## 2024-09-11 NOTE — Telephone Encounter (Signed)
 Patient requested that his 8:15am cardiac Rehab classes be changed to 6:45am. Per JD session were rescheduled.to 6:45 am

## 2024-09-15 ENCOUNTER — Encounter (HOSPITAL_COMMUNITY)
Admission: RE | Admit: 2024-09-15 | Discharge: 2024-09-15 | Disposition: A | Discharge status: Home / Self Care | Source: Ambulatory Visit | Attending: Cardiology | Admitting: Cardiology

## 2024-09-15 ENCOUNTER — Encounter (HOSPITAL_COMMUNITY): Payer: Self-pay

## 2024-09-15 VITALS — BP 118/78 | HR 56 | Ht 68.5 in | Wt 201.9 lb

## 2024-09-15 DIAGNOSIS — I213 ST elevation (STEMI) myocardial infarction of unspecified site: Secondary | ICD-10-CM

## 2024-09-15 DIAGNOSIS — Z951 Presence of aortocoronary bypass graft: Secondary | ICD-10-CM | POA: Diagnosis present

## 2024-09-15 DIAGNOSIS — Z48812 Encounter for surgical aftercare following surgery on the circulatory system: Secondary | ICD-10-CM | POA: Insufficient documentation

## 2024-09-15 HISTORY — DX: Atherosclerotic heart disease of native coronary artery without angina pectoris: I25.10

## 2024-09-15 NOTE — Progress Notes (Signed)
 Cardiac Rehab Medication Review by a Nurse  Does the patient  feel that his/her medications are working for him/her?  yes  Has the patient been experiencing any side effects to the medications prescribed?  no  Does the patient measure his/her own blood pressure or blood glucose at home?  yes   Does the patient have any problems obtaining medications due to transportation or finances?   no  Understanding of regimen: good Understanding of indications: fair to poor Potential of compliance: good    Nurse comments: Jacob Black is taking his medications as prescribed he is currently having a gout flare up. Jacob Black says he is checking his blood pressure every few days.    Jacob Gaw Camy Leder RN 09/15/2024 8:22 AM

## 2024-09-15 NOTE — Progress Notes (Signed)
 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  06/09/24 STEMI  Patient's Home Medications on Admission: Current Medications[1]  Past Medical History: Past Medical History:  Diagnosis Date   Coronary artery disease    Gout    Hyperlipidemia     Tobacco Use: Tobacco Use History[2]  Labs: Review Flowsheet       Latest Ref Rng & Units 04/25/2015 06/09/2024 06/10/2024 06/13/2024  Labs for ITP Cardiac and Pulmonary Rehab  Cholestrol 0 - 200 mg/dL - - 797  -  LDL (calc) 0 - 99 mg/dL - - 888  -  HDL-C >59 mg/dL - - 40  -  Trlycerides <150 mg/dL - - 745  -  Hemoglobin A1c 4.8 - 5.6 % - - 5.2  -  PH, Arterial 7.35 - 7.45 - - - 7.367  7.368  7.396  7.384  7.423  7.358  7.352   PCO2 arterial 32 - 48 mmHg - - - 36.7  37.6  33.7  37.7  34.3  43.1  32.1   Bicarbonate 20.0 - 28.0 mmol/L - - - 21.0  21.6  20.9  22.5  22.4  24.0  24.2  17.8   TCO2 22 - 32 mmol/L 21  23  20  22  23  22  24  23  25  23  23  25  25  23  19  19    Acid-base deficit 0.0 - 2.0 mmol/L - - - 4.0  3.0  4.0  2.0  2.0  2.0  1.0  7.0   O2 Saturation % - - - 97  99  99  100  100  90  100  100     Details       Multiple values from one day are sorted in reverse-chronological order         Capillary Blood Glucose: Lab Results  Component Value Date   GLUCAP 119 (H) 06/15/2024   GLUCAP 101 (H) 06/15/2024   GLUCAP 106 (H) 06/14/2024   GLUCAP 90 06/14/2024   GLUCAP 100 (H) 06/14/2024     Exercise Target Goals: Exercise  Program Goal: Individual exercise prescription set using results from initial 6 min walk test and THRR while considering  patients activity barriers and safety.   Exercise Prescription Goal: Initial exercise prescription builds to 30-45 minutes a day of aerobic activity, 2-3 days per week.  Home exercise guidelines will be given to patient during program as part of exercise prescription that the participant will acknowledge.  Activity Barriers & Risk Stratification:  Activity Barriers & Cardiac Risk Stratification - 09/15/24 0850       Activity Barriers & Cardiac Risk Stratification   Activity Barriers Other (comment)    Comments Bilateral foot pain from gout.    Cardiac Risk Stratification High          6 Minute Walk:  6 Minute Walk     Row Name 09/15/24 0901         6 Minute Walk  Phase Initial     Distance 1641 feet     Walk Time 6 minutes     # of Rest Breaks 0     MPH 3.11     METS 3.57     RPE 10     Perceived Dyspnea  1     VO2 Peak 12.51     Symptoms Yes (comment)     Comments Mild shortness of breath. Bilateral foot pain, 6-7/10 on pain scale, chronic from gout.     Resting HR 56 bpm     Resting BP 118/78     Resting Oxygen  Saturation  97 %     Exercise Oxygen  Saturation  during 6 min walk 99 %     Max Ex. HR 75 bpm     Max Ex. BP 126/80     2 Minute Post BP 130/78        Oxygen  Initial Assessment:   Oxygen  Re-Evaluation:   Oxygen  Discharge (Final Oxygen  Re-Evaluation):   Initial Exercise Prescription:  Initial Exercise Prescription - 09/15/24 0900       Date of Initial Exercise RX and Referring Provider   Date 09/15/24    Referring Provider Ladona Heinz, MD    Expected Discharge Date 10/30/24      Treadmill   MPH 2.4    Grade 0    Minutes 15    METs 2.84      Recumbant Elliptical   Level 1    RPM 40    Watts 40    Minutes 15    METs 2.8      Prescription Details   Frequency (times per week) 3    Duration Progress to 30 minutes  of continuous aerobic without signs/symptoms of physical distress      Intensity   THRR 40-80% of Max Heartrate 64-127    Ratings of Perceived Exertion 11-13    Perceived Dyspnea 0-4      Progression   Progression Continue to progress workloads to maintain intensity without signs/symptoms of physical distress.      Resistance Training   Training Prescription Yes    Weight 3 lbs    Reps 10-15          Perform Capillary Blood Glucose checks as needed.  Exercise Prescription Changes:   Exercise Comments:   Exercise Goals and Review:   Exercise Goals     Row Name 09/15/24 0834             Exercise Goals   Increase Physical Activity Yes       Intervention Provide advice, education, support and counseling about physical activity/exercise needs.;Develop an individualized exercise prescription for aerobic and resistive training based on initial evaluation findings, risk stratification, comorbidities and participant's personal goals.       Expected Outcomes Short Term: Attend rehab on a regular basis to increase amount of physical activity.;Long Term: Add in home exercise to make exercise part of routine and to increase amount of physical activity.;Long Term: Exercising regularly at least 3-5 days a week.       Increase Strength and Stamina Yes       Intervention Provide advice, education, support and counseling about physical activity/exercise needs.;Develop an individualized exercise prescription for aerobic and resistive training based on initial evaluation findings, risk stratification, comorbidities and participant's personal goals.       Expected Outcomes Short Term: Increase workloads from initial exercise prescription for resistance, speed, and METs.;Short Term: Perform resistance training exercises routinely  during rehab and add in resistance training at home;Long Term: Improve cardiorespiratory fitness, muscular endurance and strength as measured by increased METs and  functional capacity ( )       Able to understand and use rate of perceived exertion (RPE) scale Yes       Intervention Provide education and explanation on how to use RPE scale       Expected Outcomes Short Term: Able to use RPE daily in rehab to express subjective intensity level;Long Term:  Able to use RPE to guide intensity level when exercising independently       Knowledge and understanding of Target Heart Rate Range (THRR) Yes       Intervention Provide education and explanation of THRR including how the numbers were predicted and where they are located for reference       Expected Outcomes Short Term: Able to state/look up THRR;Long Term: Able to use THRR to govern intensity when exercising independently;Short Term: Able to use daily as guideline for intensity in rehab       Able to check pulse independently Yes       Intervention Provide education and demonstration on how to check pulse in carotid and radial arteries.;Review the importance of being able to check your own pulse for safety during independent exercise       Expected Outcomes Short Term: Able to explain why pulse checking is important during independent exercise;Long Term: Able to check pulse independently and accurately       Understanding of Exercise Prescription Yes       Intervention Provide education, explanation, and written materials on patient's individual exercise prescription       Expected Outcomes Short Term: Able to explain program exercise prescription;Long Term: Able to explain home exercise prescription to exercise independently          Exercise Goals Re-Evaluation :   Discharge Exercise Prescription (Final Exercise Prescription Changes):   Nutrition:  Target Goals: Understanding of nutrition guidelines, daily intake of sodium 1500mg , cholesterol 200mg , calories 30% from fat and 7% or less from saturated fats, daily to have 5 or more servings of fruits and vegetables.  Biometrics:  Pre Biometrics -  09/15/24 0758       Pre Biometrics   Waist Circumference 41 inches    Hip Circumference 40.5 inches    Waist to Hip Ratio 1.01 %    Triceps Skinfold 10 mm    % Body Fat 27 %    Grip Strength 28 kg    Flexibility 0 in   unable to reach box   Single Leg Stand 30 seconds           Nutrition Therapy Plan and Nutrition Goals:   Nutrition Assessments:  MEDIFICTS Score Key: >=70 Need to make dietary changes  40-70 Heart Healthy Diet <= 40 Therapeutic Level Cholesterol Diet    Picture Your Plate Scores: <59 Unhealthy dietary pattern with much room for improvement. 41-50 Dietary pattern unlikely to meet recommendations for good health and room for improvement. 51-60 More healthful dietary pattern, with some room for improvement.  >60 Healthy dietary pattern, although there may be some specific behaviors that could be improved.    Nutrition Goals Re-Evaluation:   Nutrition Goals Re-Evaluation:   Nutrition Goals Discharge (Final Nutrition Goals Re-Evaluation):   Psychosocial: Target Goals: Acknowledge presence or absence of significant depression and/or stress, maximize coping skills, provide positive support system. Participant is able to verbalize types and ability to use techniques and skills  needed for reducing stress and depression.  Initial Review & Psychosocial Screening:  Initial Psych Review & Screening - 09/15/24 0915       Initial Review   Current issues with Current Sleep Concerns;Current Stress Concerns    Source of Stress Concerns Occupation      Family Dynamics   Good Support System? Yes   Jacob Black has his wife for support     Barriers   Psychosocial barriers to participate in program There are no identifiable barriers or psychosocial needs.      Screening Interventions   Interventions Encouraged to exercise          Quality of Life Scores:  Quality of Life - 09/15/24 0833       Quality of Life   Select Quality of Life      Quality of Life  Scores   Health/Function Pre 23.17 %    Socioeconomic Pre 23.21 %    Psych/Spiritual Pre 23.21 %    Family Pre 20.5 %    GLOBAL Pre 22.79 %         Scores of 19 and below usually indicate a poorer quality of life in these areas.  A difference of  2-3 points is a clinically meaningful difference.  A difference of 2-3 points in the total score of the Quality of Life Index has been associated with significant improvement in overall quality of life, self-image, physical symptoms, and general health in studies assessing change in quality of life.  PHQ-9: Review Flowsheet       09/15/2024 10/13/2015 08/10/2015  Depression screen PHQ 2/9  Decreased Interest 3 0 0 0  Down, Depressed, Hopeless 0 0 0 0  PHQ - 2 Score 3 0 0 0  Altered sleeping 1 - -  Tired, decreased energy 3 - -  Change in appetite 0 - -  Feeling bad or failure about yourself  0 - -  Trouble concentrating 0 - -  Moving slowly or fidgety/restless 0 - -  Suicidal thoughts 0 - -  PHQ-9 Score 7 - -  Difficult doing work/chores Not difficult at all - -    Details       Multiple values from one day are sorted in reverse-chronological order        Interpretation of Total Score  Total Score Depression Severity:  1-4 = Minimal depression, 5-9 = Mild depression, 10-14 = Moderate depression, 15-19 = Moderately severe depression, 20-27 = Severe depression   Psychosocial Evaluation and Intervention:   Psychosocial Re-Evaluation:   Psychosocial Discharge (Final Psychosocial Re-Evaluation):   Vocational Rehabilitation: Provide vocational rehab assistance to qualifying candidates.   Vocational Rehab Evaluation & Intervention:  Vocational Rehab - 09/15/24 0827       Initial Vocational Rehab Evaluation & Intervention   Assessment shows need for Vocational Rehabilitation No   Jacob Black has returned to work and does not need vocational rehab at this time         Education: Education Goals: Education classes will be  provided on a weekly basis, covering required topics. Participant will state understanding/return demonstration of topics presented.     Core Videos: Exercise    Move It!  Clinical staff conducted group or individual video education with verbal and written material and guidebook.  Patient learns the recommended Pritikin exercise program. Exercise with the goal of living a long, healthy life. Some of the health benefits of exercise include controlled diabetes, healthier blood pressure levels, improved cholesterol levels, improved heart and  lung capacity, improved sleep, and better body composition. Everyone should speak with their doctor before starting or changing an exercise routine.  Biomechanical Limitations Clinical staff conducted group or individual video education with verbal and written material and guidebook.  Patient learns how biomechanical limitations can impact exercise and how we can mitigate and possibly overcome limitations to have an impactful and balanced exercise routine.  Body Composition Clinical staff conducted group or individual video education with verbal and written material and guidebook.  Patient learns that body composition (ratio of muscle mass to fat mass) is a key component to assessing overall fitness, rather than body weight alone. Increased fat mass, especially visceral belly fat, can put us  at increased risk for metabolic syndrome, type 2 diabetes, heart disease, and even death. It is recommended to combine diet and exercise (cardiovascular and resistance training) to improve your body composition. Seek guidance from your physician and exercise physiologist before implementing an exercise routine.  Exercise Action Plan Clinical staff conducted group or individual video education with verbal and written material and guidebook.  Patient learns the recommended strategies to achieve and enjoy long-term exercise adherence, including variety, self-motivation,  self-efficacy, and positive decision making. Benefits of exercise include fitness, good health, weight management, more energy, better sleep, less stress, and overall well-being.  Medical   Heart Disease Risk Reduction Clinical staff conducted group or individual video education with verbal and written material and guidebook.  Patient learns our heart is our most vital organ as it circulates oxygen , nutrients, white blood cells, and hormones throughout the entire body, and carries waste away. Data supports a plant-based eating plan like the Pritikin Program for its effectiveness in slowing progression of and reversing heart disease. The video provides a number of recommendations to address heart disease.   Metabolic Syndrome and Belly Fat  Clinical staff conducted group or individual video education with verbal and written material and guidebook.  Patient learns what metabolic syndrome is, how it leads to heart disease, and how one can reverse it and keep it from coming back. You have metabolic syndrome if you have 3 of the following 5 criteria: abdominal obesity, high blood pressure, high triglycerides, low HDL cholesterol, and high blood sugar.  Hypertension and Heart Disease Clinical staff conducted group or individual video education with verbal and written material and guidebook.  Patient learns that high blood pressure, or hypertension, is very common in the [place] . Hypertension is largely due to excessive salt intake, but other important risk factors include being overweight, physical inactivity, drinking too much alcohol, smoking, and not eating enough potassium from fruits and vegetables. High blood pressure is a leading risk factor for heart attack, stroke, congestive heart failure, dementia, kidney failure, and premature death. Long-term effects of excessive salt intake include stiffening of the arteries and thickening of heart muscle and organ damage. Recommendations include ways to  reduce hypertension and the risk of heart disease.  Diseases of Our Time - Focusing on Diabetes Clinical staff conducted group or individual video education with verbal and written material and guidebook.  Patient learns why the best way to stop diseases of our time is prevention, through food and other lifestyle changes. Medicine (such as prescription pills and surgeries) is often only a Band-Aid on the problem, not a long-term solution. Most common diseases of our time include obesity, type 2 diabetes, hypertension, heart disease, and cancer. The Pritikin Program is recommended and has been proven to help reduce, reverse, and/or prevent the damaging effects of  metabolic syndrome.  Nutrition   Overview of the Pritikin Eating Plan  Clinical staff conducted group or individual video education with verbal and written material and guidebook.  Patient learns about the Pritikin Eating Plan for disease risk reduction. The Pritikin Eating Plan emphasizes a wide variety of unrefined, minimally-processed carbohydrates, like fruits, vegetables, whole grains, and legumes. Go, Caution, and Stop food choices are explained. Plant-based and lean animal proteins are emphasized. Rationale provided for low sodium intake for blood pressure control, low added sugars for blood sugar stabilization, and low added fats and oils for coronary artery disease risk reduction and weight management.  Calorie Density  Clinical staff conducted group or individual video education with verbal and written material and guidebook.  Patient learns about calorie density and how it impacts the Pritikin Eating Plan. Knowing the characteristics of the food you choose will help you decide whether those foods will lead to weight gain or weight loss, and whether you want to consume more or less of them. Weight loss is usually a side effect of the Pritikin Eating Plan because of its focus on low calorie-dense foods.  Label Reading  Clinical staff  conducted group or individual video education with verbal and written material and guidebook.  Patient learns about the Pritikin recommended label reading guidelines and corresponding recommendations regarding calorie density, added sugars, sodium content, and whole grains.  Dining Out - Part 1  Clinical staff conducted group or individual video education with verbal and written material and guidebook.  Patient learns that restaurant meals can be sabotaging because they can be so high in calories, fat, sodium, and/or sugar. Patient learns recommended strategies on how to positively address this and avoid unhealthy pitfalls.  Facts on Fats  Clinical staff conducted group or individual video education with verbal and written material and guidebook.  Patient learns that lifestyle modifications can be just as effective, if not more so, as many medications for lowering your risk of heart disease. A Pritikin lifestyle can help to reduce your risk of inflammation and atherosclerosis (cholesterol build-up, or plaque, in the artery walls). Lifestyle interventions such as dietary choices and physical activity address the cause of atherosclerosis. A review of the types of fats and their impact on blood cholesterol levels, along with dietary recommendations to reduce fat intake is also included.  Nutrition Action Plan  Clinical staff conducted group or individual video education with verbal and written material and guidebook.  Patient learns how to incorporate Pritikin recommendations into their lifestyle. Recommendations include planning and keeping personal health goals in mind as an important part of their success.  Healthy Mind-Set    Healthy Minds, Bodies, Hearts  Clinical staff conducted group or individual video education with verbal and written material and guidebook.  Patient learns how to identify when they are stressed. Video will discuss the impact of that stress, as well as the many benefits of  stress management. Patient will also be introduced to stress management techniques. The way we think, act, and feel has an impact on our hearts.  How Our Thoughts Can Heal Our Hearts  Clinical staff conducted group or individual video education with verbal and written material and guidebook.  Patient learns that negative thoughts can cause depression and anxiety. This can result in negative lifestyle behavior and serious health problems. Cognitive behavioral therapy is an effective method to help control our thoughts in order to change and improve our emotional outlook.  Additional Videos:  Exercise    Improving Performance  Clinical staff conducted group or individual video education with verbal and written material and guidebook.  Patient learns to use a non-linear approach by alternating intensity levels and lengths of time spent exercising to help burn more calories and lose more body fat. Cardiovascular exercise helps improve heart health, metabolism, hormonal balance, blood sugar control, and recovery from fatigue. Resistance training improves strength, endurance, balance, coordination, reaction time, metabolism, and muscle mass. Flexibility exercise improves circulation, posture, and balance. Seek guidance from your physician and exercise physiologist before implementing an exercise routine and learn your capabilities and proper form for all exercise.  Introduction to Yoga  Clinical staff conducted group or individual video education with verbal and written material and guidebook.  Patient learns about yoga, a discipline of the coming together of mind, breath, and body. The benefits of yoga include improved flexibility, improved range of motion, better posture and core strength, increased lung function, weight loss, and positive self-image. Yogas heart health benefits include lowered blood pressure, healthier heart rate, decreased cholesterol and triglyceride levels, improved immune function,  and reduced stress. Seek guidance from your physician and exercise physiologist before implementing an exercise routine and learn your capabilities and proper form for all exercise.  Medical   Aging: Enhancing Your Quality of Life  Clinical staff conducted group or individual video education with verbal and written material and guidebook.  Patient learns key strategies and recommendations to stay in good physical health and enhance quality of life, such as prevention strategies, having an advocate, securing a Health Care Proxy and Power of Attorney, and keeping a list of medications and system for tracking them. It also discusses how to avoid risk for bone loss.  Biology of Weight Control  Clinical staff conducted group or individual video education with verbal and written material and guidebook.  Patient learns that weight gain occurs because we consume more calories than we burn (eating more, moving less). Even if your body weight is normal, you may have higher ratios of fat compared to muscle mass. Too much body fat puts you at increased risk for cardiovascular disease, heart attack, stroke, type 2 diabetes, and obesity-related cancers. In addition to exercise, following the Pritikin Eating Plan can help reduce your risk.  Decoding Lab Results  Clinical staff conducted group or individual video education with verbal and written material and guidebook.  Patient learns that lab test reflects one measurement whose values change over time and are influenced by many factors, including medication, stress, sleep, exercise, food, hydration, pre-existing medical conditions, and more. It is recommended to use the knowledge from this video to become more involved with your lab results and evaluate your numbers to speak with your doctor.   Diseases of Our Time - Overview  Clinical staff conducted group or individual video education with verbal and written material and guidebook.  Patient learns that  according to the CDC, 50% to 70% of chronic diseases (such as obesity, type 2 diabetes, elevated lipids, hypertension, and heart disease) are avoidable through lifestyle improvements including healthier food choices, listening to satiety cues, and increased physical activity.  Sleep Disorders Clinical staff conducted group or individual video education with verbal and written material and guidebook.  Patient learns how good quality and duration of sleep are important to overall health and well-being. Patient also learns about sleep disorders and how they impact health along with recommendations to address them, including discussing with a physician.  Nutrition  Dining Out - Part 2 Clinical staff conducted group or individual  video education with verbal and written material and guidebook.  Patient learns how to plan ahead and communicate in order to maximize their dining experience in a healthy and nutritious manner. Included are recommended food choices based on the type of restaurant the patient is visiting.   Fueling a Banker conducted group or individual video education with verbal and written material and guidebook.  There is a strong connection between our food choices and our health. Diseases like obesity and type 2 diabetes are very prevalent and are in large-part due to lifestyle choices. The Pritikin Eating Plan provides plenty of food and hunger-curbing satisfaction. It is easy to follow, affordable, and helps reduce health risks.  Menu Workshop  Clinical staff conducted group or individual video education with verbal and written material and guidebook.  Patient learns that restaurant meals can sabotage health goals because they are often packed with calories, fat, sodium, and sugar. Recommendations include strategies to plan ahead and to communicate with the manager, chef, or server to help order a healthier meal.  Planning Your Eating Strategy  Clinical staff  conducted group or individual video education with verbal and written material and guidebook.  Patient learns about the Pritikin Eating Plan and its benefit of reducing the risk of disease. The Pritikin Eating Plan does not focus on calories. Instead, it emphasizes high-quality, nutrient-rich foods. By knowing the characteristics of the foods, we choose, we can determine their calorie density and make informed decisions.  Targeting Your Nutrition Priorities  Clinical staff conducted group or individual video education with verbal and written material and guidebook.  Patient learns that lifestyle habits have a tremendous impact on disease risk and progression. This video provides eating and physical activity recommendations based on your personal health goals, such as reducing LDL cholesterol, losing weight, preventing or controlling type 2 diabetes, and reducing high blood pressure.  Vitamins and Minerals  Clinical staff conducted group or individual video education with verbal and written material and guidebook.  Patient learns different ways to obtain key vitamins and minerals, including through a recommended healthy diet. It is important to discuss all supplements you take with your doctor.   Healthy Mind-Set    Smoking Cessation  Clinical staff conducted group or individual video education with verbal and written material and guidebook.  Patient learns that cigarette smoking and tobacco addiction pose a serious health risk which affects millions of people. Stopping smoking will significantly reduce the risk of heart disease, lung disease, and many forms of cancer. Recommended strategies for quitting are covered, including working with your doctor to develop a successful plan.  Culinary   Becoming a Set Designer conducted group or individual video education with verbal and written material and guidebook.  Patient learns that cooking at home can be healthy, cost-effective,  quick, and puts them in control. Keys to cooking healthy recipes will include looking at your recipe, assessing your equipment needs, planning ahead, making it simple, choosing cost-effective seasonal ingredients, and limiting the use of added fats, salts, and sugars.  Cooking - Breakfast and Snacks  Clinical staff conducted group or individual video education with verbal and written material and guidebook.  Patient learns how important breakfast is to satiety and nutrition through the entire day. Recommendations include key foods to eat during breakfast to help stabilize blood sugar levels and to prevent overeating at meals later in the day. Planning ahead is also a key component.  Cooking - Public Affairs Consultant  Clinical staff conducted group or individual video education with verbal and written material and guidebook.  Patient learns eating strategies to improve overall health, including an approach to cook more at home. Recommendations include thinking of animal protein as a side on your plate rather than center stage and focusing instead on lower calorie dense options like vegetables, fruits, whole grains, and plant-based proteins, such as beans. Making sauces in large quantities to freeze for later and leaving the skin on your vegetables are also recommended to maximize your experience.  Cooking - Healthy Salads and Dressing Clinical staff conducted group or individual video education with verbal and written material and guidebook.  Patient learns that vegetables, fruits, whole grains, and legumes are the foundations of the Pritikin Eating Plan. Recommendations include how to incorporate each of these in flavorful and healthy salads, and how to create homemade salad dressings. Proper handling of ingredients is also covered. Cooking - Soups and [employer] - Soups and Desserts Clinical staff conducted group or individual video education with verbal and written material and guidebook.  Patient  learns that Pritikin soups and desserts make for easy, nutritious, and delicious snacks and meal components that are low in sodium, fat, sugar, and calorie density, while high in vitamins, minerals, and filling fiber. Recommendations include simple and healthy ideas for soups and desserts.   Overview     The Pritikin Solution Program Overview Clinical staff conducted group or individual video education with verbal and written material and guidebook.  Patient learns that the results of the Pritikin Program have been documented in more than 100 articles published in peer-reviewed journals, and the benefits include reducing risk factors for (and, in some cases, even reversing) high cholesterol, high blood pressure, type 2 diabetes, obesity, and more! An overview of the three key pillars of the Pritikin Program will be covered: eating well, doing regular exercise, and having a healthy mind-set.  WORKSHOPS  Exercise: Exercise Basics: Building Your Action Plan Clinical staff led group instruction and group discussion with PowerPoint presentation and patient guidebook. To enhance the learning environment the use of posters, models and videos may be added. At the conclusion of this workshop, patients will comprehend the difference between physical activity and exercise, as well as the benefits of incorporating both, into their routine. Patients will understand the FITT (Frequency, Intensity, Time, and Type) principle and how to use it to build an exercise action plan. In addition, safety concerns and other considerations for exercise and cardiac rehab will be addressed by the presenter. The purpose of this lesson is to promote a comprehensive and effective weekly exercise routine in order to improve patients overall level of fitness.   Managing Heart Disease: Your Path to a Healthier Heart Clinical staff led group instruction and group discussion with PowerPoint presentation and patient guidebook. To  enhance the learning environment the use of posters, models and videos may be added.At the conclusion of this workshop, patients will understand the anatomy and physiology of the heart. Additionally, they will understand how Pritikins three pillars impact the risk factors, the progression, and the management of heart disease.  The purpose of this lesson is to provide a high-level overview of the heart, heart disease, and how the Pritikin lifestyle positively impacts risk factors.  Exercise Biomechanics Clinical staff led group instruction and group discussion with PowerPoint presentation and patient guidebook. To enhance the learning environment the use of posters, models and videos may be added. Patients will learn how the structural parts of  their bodies function and how these functions impact their daily activities, movement, and exercise. Patients will learn how to promote a neutral spine, learn how to manage pain, and identify ways to improve their physical movement in order to promote healthy living. The purpose of this lesson is to expose patients to common physical limitations that impact physical activity. Participants will learn practical ways to adapt and manage aches and pains, and to minimize their effect on regular exercise. Patients will learn how to maintain good posture while sitting, walking, and lifting.  Balance Training and Fall Prevention  Clinical staff led group instruction and group discussion with PowerPoint presentation and patient guidebook. To enhance the learning environment the use of posters, models and videos may be added. At the conclusion of this workshop, patients will understand the importance of their sensorimotor skills (vision, proprioception, and the vestibular system) in maintaining their ability to balance as they age. Patients will apply a variety of balancing exercises that are appropriate for their current level of function. Patients will understand  the common causes for poor balance, possible solutions to these problems, and ways to modify their physical environment in order to minimize their fall risk. The purpose of this lesson is to teach patients about the importance of maintaining balance as they age and ways to minimize their risk of falling.  WORKSHOPS   Nutrition:  Fueling a Ship Broker led group instruction and group discussion with PowerPoint presentation and patient guidebook. To enhance the learning environment the use of posters, models and videos may be added. Patients will review the foundational principles of the Pritikin Eating Plan and understand what constitutes a serving size in each of the food groups. Patients will also learn Pritikin-friendly foods that are better choices when away from home and review make-ahead meal and snack options. Calorie density will be reviewed and applied to three nutrition priorities: weight maintenance, weight loss, and weight gain. The purpose of this lesson is to reinforce (in a group setting) the key concepts around what patients are recommended to eat and how to apply these guidelines when away from home by planning and selecting Pritikin-friendly options. Patients will understand how calorie density may be adjusted for different weight management goals.  Mindful Eating  Clinical staff led group instruction and group discussion with PowerPoint presentation and patient guidebook. To enhance the learning environment the use of posters, models and videos may be added. Patients will briefly review the concepts of the Pritikin Eating Plan and the importance of low-calorie dense foods. The concept of mindful eating will be introduced as well as the importance of paying attention to internal hunger signals. Triggers for non-hunger eating and techniques for dealing with triggers will be explored. The purpose of this lesson is to provide patients with the opportunity to review the basic  principles of the Pritikin Eating Plan, discuss the value of eating mindfully and how to measure internal cues of hunger and fullness using the Hunger Scale. Patients will also discuss reasons for non-hunger eating and learn strategies to use for controlling emotional eating.  Targeting Your Nutrition Priorities Clinical staff led group instruction and group discussion with PowerPoint presentation and patient guidebook. To enhance the learning environment the use of posters, models and videos may be added. Patients will learn how to determine their genetic susceptibility to disease by reviewing their family history. Patients will gain insight into the importance of diet as part of an overall healthy lifestyle in mitigating the impact of genetics  and other environmental insults. The purpose of this lesson is to provide patients with the opportunity to assess their personal nutrition priorities by looking at their family history, their own health history and current risk factors. Patients will also be able to discuss ways of prioritizing and modifying the Pritikin Eating Plan for their highest risk areas  Menu  Clinical staff led group instruction and group discussion with PowerPoint presentation and patient guidebook. To enhance the learning environment the use of posters, models and videos may be added. Using menus brought in from [employer], or printed from [employer], patients will apply the Pritikin dining out guidelines that were presented in the [employer] video. Patients will also be able to practice these guidelines in a variety of provided scenarios. The purpose of this lesson is to provide patients with the opportunity to practice hands-on learning of the Pritikin Dining Out guidelines with actual menus and practice scenarios.  Label Reading Clinical staff led group instruction and group discussion with PowerPoint presentation and patient guidebook. To enhance the  learning environment the use of posters, models and videos may be added. Patients will review and discuss the Pritikin label reading guidelines presented in Pritikins Label Reading Educational series video. Using fool labels brought in from local grocery stores and markets, patients will apply the label reading guidelines and determine if the packaged food meet the Pritikin guidelines. The purpose of this lesson is to provide patients with the opportunity to review, discuss, and practice hands-on learning of the Pritikin Label Reading guidelines with actual packaged food labels. Cooking School  Pritikins [employer] are designed to teach patients ways to prepare quick, simple, and affordable recipes at home. The importance of nutritions role in chronic disease risk reduction is reflected in its emphasis in the overall Pritikin program. By learning how to prepare essential core Pritikin Eating Plan recipes, patients will increase control over what they eat; be able to customize the flavor of foods without the use of added salt, sugar, or fat; and improve the quality of the food they consume. By learning a set of core recipes which are easily assembled, quickly prepared, and affordable, patients are more likely to prepare more healthy foods at home. These workshops focus on convenient breakfasts, simple entres, side dishes, and desserts which can be prepared with minimal effort and are consistent with nutrition recommendations for cardiovascular risk reduction. Cooking [employer] are taught by a armed forces logistics/support/administrative officer (RD) who has been trained by the [employer]. The chef or RD has a clear understanding of the importance of minimizing - if not completely eliminating - added fat, sugar, and sodium in recipes. Throughout the series of Cooking School Workshop sessions, patients will learn about healthy ingredients and efficient methods of cooking to build confidence in their  capability to prepare    Cooking School weekly topics:  Adding Flavor- Sodium-Free  Fast and Healthy Breakfasts  Powerhouse Plant-Based Proteins  Satisfying Salads and Dressings  Simple Sides and Sauces  International Cuisine-Spotlight on the [employer] Zones  Delicious Desserts  Savory Soups  [employer] - Meals in a Astronomer Appetizers and Snacks  Comforting Weekend Breakfasts  One-Pot Wonders   Fast Evening Meals  Landscape Architect Your Pritikin Plate  WORKSHOPS   Healthy Mindset (Psychosocial):  Focused Goals, Sustainable Changes Clinical staff led group instruction and group discussion with PowerPoint presentation and patient guidebook. To enhance the learning environment the use of posters,  models and videos may be added. Patients will be able to apply effective goal setting strategies to establish at least one personal goal, and then take consistent, meaningful action toward that goal. They will learn to identify common barriers to achieving personal goals and develop strategies to overcome them. Patients will also gain an understanding of how our mind-set can impact our ability to achieve goals and the importance of cultivating a positive and growth-oriented mind-set. The purpose of this lesson is to provide patients with a deeper understanding of how to set and achieve personal goals, as well as the tools and strategies needed to overcome common obstacles which may arise along the way.  From Head to Heart: The Power of a Healthy Outlook  Clinical staff led group instruction and group discussion with PowerPoint presentation and patient guidebook. To enhance the learning environment the use of posters, models and videos may be added. Patients will be able to recognize and describe the impact of emotions and mood on physical health. They will discover the importance of self-care and explore self-care practices which may work for them. Patients will also learn how  to utilize the 4 Cs to cultivate a healthier outlook and better manage stress and challenges. The purpose of this lesson is to demonstrate to patients how a healthy outlook is an essential part of maintaining good health, especially as they continue their cardiac rehab journey.  Healthy Sleep for a Healthy Heart Clinical staff led group instruction and group discussion with PowerPoint presentation and patient guidebook. To enhance the learning environment the use of posters, models and videos may be added. At the conclusion of this workshop, patients will be able to demonstrate knowledge of the importance of sleep to overall health, well-being, and quality of life. They will understand the symptoms of, and treatments for, common sleep disorders. Patients will also be able to identify daytime and nighttime behaviors which impact sleep, and they will be able to apply these tools to help manage sleep-related challenges. The purpose of this lesson is to provide patients with a general overview of sleep and outline the importance of quality sleep. Patients will learn about a few of the most common sleep disorders. Patients will also be introduced to the concept of sleep hygiene, and discover ways to self-manage certain sleeping problems through simple daily behavior changes. Finally, the workshop will motivate patients by clarifying the links between quality sleep and their goals of heart-healthy living.   Recognizing and Reducing Stress Clinical staff led group instruction and group discussion with PowerPoint presentation and patient guidebook. To enhance the learning environment the use of posters, models and videos may be added. At the conclusion of this workshop, patients will be able to understand the types of stress reactions, differentiate between acute and chronic stress, and recognize the impact that chronic stress has on their health. They will also be able to apply different coping mechanisms, such as  reframing negative self-talk. Patients will have the opportunity to practice a variety of stress management techniques, such as deep abdominal breathing, progressive muscle relaxation, and/or guided imagery.  The purpose of this lesson is to educate patients on the role of stress in their lives and to provide healthy techniques for coping with it.  Learning Barriers/Preferences:  Learning Barriers/Preferences - 09/15/24 0856       Learning Barriers/Preferences   Learning Barriers Exercise Concerns   Having a gout flare up both feet are hurting   Learning Preferences Audio;Verbal Instruction;Video;Computer/Internet;Group Instruction;Written Material;Pictoral;Skilled Demonstration;Individual Instruction  Education Topics:  Knowledge Questionnaire Score:  Knowledge Questionnaire Score - 09/15/24 0831       Knowledge Questionnaire Score   Pre Score 22/24          Core Components/Risk Factors/Patient Goals at Admission:  Personal Goals and Risk Factors at Admission - 09/15/24 0834       Core Components/Risk Factors/Patient Goals on Admission    Weight Management Yes;Obesity;Weight Loss    Intervention Weight Management/Obesity: Establish reasonable short term and long term weight goals.;Obesity: Provide education and appropriate resources to help participant work on and attain dietary goals.    Admit Weight 201 lb 15.1 oz (91.6 kg)    Goal Weight: Short Term 196 lb (88.9 kg)    Goal Weight: Long Term 170 lb (77.1 kg)    Expected Outcomes Short Term: Continue to assess and modify interventions until short term weight is achieved;Long Term: Adherence to nutrition and physical activity/exercise program aimed toward attainment of established weight goal;Weight Loss: Understanding of general recommendations for a balanced deficit meal plan, which promotes 1-2 lb weight loss per week and includes a negative energy balance of [phone] kcal/d    Tobacco Cessation Yes    Number of  packs per day 0.5 quit 06/09/24    Intervention Offer self-teaching materials, assist with locating and accessing local/national Quit Smoking programs, and support quit date choice.    Expected Outcomes Short Term: Will quit all tobacco product use, adhering to prevention of relapse plan.;Long Term: Complete abstinence from all tobacco products for at least 12 months from quit date.    Lipids Yes    Intervention Provide education and support for participant on nutrition & aerobic/resistive exercise along with prescribed medications to achieve LDL 70mg , HDL >40mg .    Expected Outcomes Short Term: Participant states understanding of desired cholesterol values and is compliant with medications prescribed. Participant is following exercise prescription and nutrition guidelines.;Long Term: Cholesterol controlled with medications as prescribed, with individualized exercise RX and with personalized nutrition plan. Value goals: LDL < 70mg , HDL > 40 mg.    Stress Yes    Intervention Offer individual and/or small group education and counseling on adjustment to heart disease, stress management and health-related lifestyle change. Teach and support self-help strategies.;Refer participants experiencing significant psychosocial distress to appropriate mental health specialists for further evaluation and treatment. When possible, include family members and significant others in education/counseling sessions.    Expected Outcomes Short Term: Participant demonstrates changes in health-related behavior, relaxation and other stress management skills, ability to obtain effective social support, and compliance with psychotropic medications if prescribed.;Long Term: Emotional wellbeing is indicated by absence of clinically significant psychosocial distress or social isolation.          Core Components/Risk Factors/Patient Goals Review:    Core Components/Risk Factors/Patient Goals at Discharge (Final Review):    ITP  Comments:  ITP Comments     Row Name 09/15/24 0758           ITP Comments Wilbert Bihari, MD: Medical Director. Introduction to the [facility] / Intensive Cardiac Rehab. Initial orientation packet reviewed with the patient.          Comments: Jacob Black attended orientation for the cardiac rehabilitation program on  09/15/2024  to perform initial intake and exercise walk test. Patient introduced to the Pritikin Program education and orientation packet was reviewed. Completed 6-minute walk test, measurements, initial ITP, and exercise prescription. Vital signs stable. Telemetry-normal sinus rhythm, first degree heart block this has been previously  documented. Jacob Black reported bilateral foot pain due to gout flare up and mild shortness of breath.Hadassah Elpidio Quan RN BSN   Service time was from [phone] to [phone].        [1]  Current Outpatient Medications:    aspirin  EC 81 MG tablet, Take 1 tablet (81 mg total) by mouth daily. Swallow whole., Disp: , Rfl:    clopidogrel  (PLAVIX ) 75 MG tablet, Take 1 tablet (75 mg total) by mouth daily., Disp: 90 tablet, Rfl: 0   colchicine  0.6 MG tablet, Take 0.6-1.2 mg by mouth 2 (two) times daily as needed (gout flare)., Disp: , Rfl:    Febuxostat  80 MG TABS, Take 1 tablet by mouth daily., Disp: , Rfl:    metoprolol  tartrate (LOPRESSOR ) 25 MG tablet, Take 1 tablet (25 mg total) by mouth 2 (two) times daily., Disp: 180 tablet, Rfl: 0   rosuvastatin  (CRESTOR ) 40 MG tablet, Take 1 tablet (40 mg total) by mouth daily., Disp: 90 tablet, Rfl: 0   acetaminophen  (TYLENOL ) 325 MG tablet, Take 2 tablets (650 mg total) by mouth every 6 (six) hours as needed for mild pain (pain score 1-3). (Patient not taking: Reported on 09/15/2024), Disp: , Rfl:    folic acid  (FOLVITE ) 1 MG tablet, Take 1 tablet (1 mg total) by mouth daily. (Patient not taking: Reported on 09/15/2024), Disp: , Rfl:    Multiple Vitamin (MULTIVITAMIN WITH MINERALS) TABS tablet, Take 1 tablet by mouth  daily. (Patient not taking: Reported on 09/15/2024), Disp: , Rfl:    nystatin -triamcinolone  ointment (MYCOLOG), Apply 1 Application topically 2 (two) times daily. (Patient not taking: Reported on 09/15/2024), Disp: 30 g, Rfl: 0   oxyCODONE  (OXY IR/ROXICODONE ) 5 MG immediate release tablet, Take 5 mg by mouth every 4 (four) hours as needed for severe pain (pain score 7-10). (Patient not taking: Reported on 09/15/2024), Disp: , Rfl:    predniSONE  (STERAPRED UNI-PAK 21 TAB) 5 MG (21) TBPK tablet, Take 5 mg by mouth daily. (Patient not taking: Reported on 09/15/2024), Disp: , Rfl:    thiamine  (VITAMIN B-1) 100 MG tablet, Take 1 tablet (100 mg total) by mouth daily. (Patient not taking: Reported on 09/15/2024), Disp: , Rfl:  [2]  Social History Tobacco Use  Smoking Status Former   Current packs/day: 0.00   Average packs/day: 0.5 packs/day for 49.8 years (24.9 ttl pk-yrs)   Types: Cigarettes   Start date: 09/03/1974   Quit date: 06/09/2024   Years since quitting: 0.2  Smokeless Tobacco Never

## 2024-09-18 ENCOUNTER — Encounter (HOSPITAL_COMMUNITY): Payer: Self-pay

## 2024-09-18 ENCOUNTER — Emergency Department (HOSPITAL_COMMUNITY)
Admission: EM | Admit: 2024-09-18 | Discharge: 2024-09-18 | Discharge status: Against Medical Advice | Attending: Emergency Medicine | Admitting: Emergency Medicine

## 2024-09-18 ENCOUNTER — Other Ambulatory Visit: Payer: Self-pay

## 2024-09-18 ENCOUNTER — Emergency Department (HOSPITAL_COMMUNITY)

## 2024-09-18 DIAGNOSIS — Z5321 Procedure and treatment not carried out due to patient leaving prior to being seen by health care provider: Secondary | ICD-10-CM | POA: Insufficient documentation

## 2024-09-18 DIAGNOSIS — R0789 Other chest pain: Secondary | ICD-10-CM | POA: Diagnosis present

## 2024-09-18 LAB — CBC
HCT: 41.2 % (ref 39.0–52.0)
Hemoglobin: 13.6 g/dL (ref 13.0–17.0)
MCH: 30.9 pg (ref 26.0–34.0)
MCHC: 33 g/dL (ref 30.0–36.0)
MCV: 93.6 fL (ref 80.0–100.0)
Platelets: 261 K/uL (ref 150–400)
RBC: 4.4 MIL/uL (ref 4.22–5.81)
RDW: 13.3 % (ref 11.5–15.5)
WBC: 9.2 K/uL (ref 4.0–10.5)
nRBC: 0 % (ref 0.0–0.2)

## 2024-09-18 LAB — HEPATIC FUNCTION PANEL
ALT: 26 U/L (ref 0–44)
AST: 28 U/L (ref 15–41)
Albumin: 4.8 g/dL (ref 3.5–5.0)
Alkaline Phosphatase: 72 U/L (ref 38–126)
Bilirubin, Direct: <0.1 mg/dL (ref 0.0–0.2)
Total Bilirubin: 0.2 mg/dL (ref 0.0–1.2)
Total Protein: 8 g/dL (ref 6.5–8.1)

## 2024-09-18 LAB — LIPASE, BLOOD: Lipase: 25 U/L (ref 11–51)

## 2024-09-18 LAB — TROPONIN T, HIGH SENSITIVITY: Troponin T High Sensitivity: <15 ng/L (ref 0–19)

## 2024-09-18 LAB — BASIC METABOLIC PANEL WITH GFR
Anion gap: 14 (ref 5–15)
BUN: 17 mg/dL (ref 8–23)
CO2: 21 mmol/L — ABNORMAL LOW (ref 22–32)
Calcium: 9.6 mg/dL (ref 8.9–10.3)
Chloride: 102 mmol/L (ref 98–111)
Creatinine, Ser: 1.11 mg/dL (ref 0.61–1.24)
GFR, Estimated: >60 mL/min
Glucose, Bld: 85 mg/dL (ref 70–99)
Potassium: 4 mmol/L (ref 3.5–5.1)
Sodium: 138 mmol/L (ref 135–145)

## 2024-09-18 NOTE — ED Triage Notes (Signed)
 Patient bib [facility] from a bar where he was drinking.   He is having Left sided chest pain - sharp, no radiation 7/10 Period of unresponsiveness that lasted a few minutes. Did not fall or hit head.  Recently had a Quadruple bypass 6 weeks ago.   140/66 P 64 97% RA  CBG 96

## 2024-09-18 NOTE — ED Provider Triage Note (Signed)
 Emergency Medicine Provider Triage Evaluation Note  TERRON MERFELD , a 64 y.o. male hx of  was evaluated in triage.  Pt complains of chest pain started earlier today. Currently asymptomatic. Was at the bar drinking when it started. Took aspirin  in ambulance.   Review of Systems  Positive:  Negative:   Physical Exam  There were no vitals taken for this visit. Gen:   Awake, no distress  Resp:  Normal effort  MSK:   Moves extremities without difficulty  Other:    Medical Decision Making  Medically screening exam initiated at 6:42 PM.  Appropriate orders placed.  KELSEN CELONA was informed that the remainder of the evaluation will be completed by another provider, this initial triage assessment does not replace that evaluation, and the importance of remaining in the ED until their evaluation is complete.   Yolande Lamar BROCKS, MD 09/18/24 [phone]

## 2024-09-18 NOTE — ED Notes (Signed)
 Pt called X2. Pt no longer in lobby

## 2024-09-18 NOTE — ED Triage Notes (Signed)
 Patient is stating that he thinks he is ok to leave. RN encouraged him to stay and see what caused his chest pain and his passing out spell.  He states that he is going to stay for now.

## 2024-09-21 ENCOUNTER — Encounter (HOSPITAL_COMMUNITY): Admission: RE | Admit: 2024-09-21 | Source: Ambulatory Visit

## 2024-09-23 ENCOUNTER — Encounter (HOSPITAL_COMMUNITY)

## 2024-09-23 ENCOUNTER — Encounter (HOSPITAL_COMMUNITY)
Admission: RE | Admit: 2024-09-23 | Discharge: 2024-09-23 | Disposition: A | Discharge status: Home / Self Care | Source: Ambulatory Visit

## 2024-09-23 NOTE — Progress Notes (Signed)
 Incomplete Session Note  Patient Details  Name: [name] MRN: [id number] Date of Birth: Nov 06, 1960 Referring Provider:   Flowsheet Row CARDIAC REHAB PHASE II ORIENTATION from 09/15/2024 in [facility]  Referring Provider Ladona Heinz, MD    Ozell JONELLE Barge did not complete his rehab session.  Its was noted that Mr Skoda went to the ED on 01/16///26 via ambulance and left without being evaluated. Will notify onsite provider and obtain appointment for the patient to be seen in the office. Will hold exercise until cleared to proceed. Patient states understanding. Appointment obtained for Mr Slomski to see Medford Meager NP at [facility] at [phone]. Patient was given address. ED precautions reviewed.Hadassah Elpidio Quan RN BSN

## 2024-09-24 ENCOUNTER — Encounter: Payer: Self-pay | Admitting: Nurse Practitioner

## 2024-09-24 ENCOUNTER — Ambulatory Visit: Attending: Nurse Practitioner | Admitting: Nurse Practitioner

## 2024-09-24 VITALS — BP 100/64 | HR 62 | Ht 69.0 in | Wt 201.0 lb

## 2024-09-24 DIAGNOSIS — I2510 Atherosclerotic heart disease of native coronary artery without angina pectoris: Secondary | ICD-10-CM

## 2024-09-24 DIAGNOSIS — R002 Palpitations: Secondary | ICD-10-CM

## 2024-09-24 DIAGNOSIS — E785 Hyperlipidemia, unspecified: Secondary | ICD-10-CM

## 2024-09-24 DIAGNOSIS — Z720 Tobacco use: Secondary | ICD-10-CM | POA: Diagnosis not present

## 2024-09-24 DIAGNOSIS — R072 Precordial pain: Secondary | ICD-10-CM

## 2024-09-24 MED ORDER — CLOPIDOGREL BISULFATE 75 MG PO TABS: 75.0000 mg | ORAL_TABLET | Freq: Every day | ORAL | 3 refills | Status: AC

## 2024-09-24 MED ORDER — ROSUVASTATIN CALCIUM 40 MG PO TABS: 40.0000 mg | ORAL_TABLET | Freq: Every day | ORAL | 3 refills | Status: AC

## 2024-09-24 MED ORDER — METOPROLOL TARTRATE 25 MG PO TABS: 25.0000 mg | ORAL_TABLET | Freq: Two times a day (BID) | ORAL | 3 refills | Status: AC

## 2024-09-24 NOTE — Progress Notes (Signed)
 "    Office Visit    Patient Name: Jacob Black Date of Encounter: 09/24/2024  Primary Care Provider:  [name], MD Primary Cardiologist:  Jacob Bergamo, MD    Chief Complaint    64 y.o. male with a history of CAD status post inferior STEMI in October 2025 with subsequent CABG x 4, ischemic cardiomyopathy (EF 50-55% October 2025), hyperlipidemia, elevated LP(a), gout, and prior tobacco abuse (quit 06/2024), who presents for follow-up after recent ED visit for chest pain.  Past Medical History   Subjective   Past Medical History:  Diagnosis Date   Coronary artery disease    a. 07/2023 Cardiac CT: Ca2+ = 258; b. 06/2024 Inferior STEMI/PTCA: LM 40d, hazy, LAD 99ost/p, 44m/d, D1 80ost, LCX 99ost, OM1 99ost, 80p, OM2 nl, RCA 31m thrombotic (PTCA), EF 45-50, inf HK/AK; c. 06/2024 CABG x 4: LIMA->LAD, VG->OM, VG->RPDA, VG->D1.   Elevated Lp(a)    a. 06/2024 LP(a) = 233.5.   Gout    Hyperlipidemia LDL goal <55    Tobacco abuse    Past Surgical History:  Procedure Laterality Date   CARDIAC CATHETERIZATION     CORONARY ARTERY BYPASS GRAFT N/A 06/13/2024   Procedure: CORONARY ARTERY BYPASS GRAFTING (CABG) X FOUR USING LEFT INTERNAL MAMMARY ARTERY AND BILATERAL GREATER SAPHENOUS VEIN HARVESTED ENDOSCOPICALLY;  Surgeon: Jacob Dorise POUR, MD;  Location: [facility] OR;  Service: Open Heart Surgery;  Laterality: N/A;   CORONARY/GRAFT ACUTE MI REVASCULARIZATION N/A 06/09/2024   Procedure: Coronary/Graft Acute MI Revascularization;  Surgeon: Black Gordy, MD;  Location: [facility] INVASIVE CV LAB;  Service: Cardiovascular;  Laterality: N/A;   FRACTURE SURGERY     INTRAOPERATIVE TRANSESOPHAGEAL ECHOCARDIOGRAM N/A 06/13/2024   Procedure: ECHOCARDIOGRAM, TRANSESOPHAGEAL, INTRAOPERATIVE;  Surgeon: Jacob Dorise POUR, MD;  Location: [facility] OR;  Service: Open Heart Surgery;  Laterality: N/A;   LEG SURGERY      Allergies  Allergies[1]     History of Present Illness      64 y.o. y/o male with a history of CAD,  hyperlipidemia, elevated LP(a), gout, and prior tobacco abuse (quit 06/2024).  He previously underwent calcium  scoring in November 2024 with an elevated score of 258.  In October 2025, he presented with inferior STEMI.  Catheterization revealed severe multivessel CAD as outlined in the past medical history including a thrombotic subtotal occlusion of the right coronary artery, which was treated with PTCA and the patient subsequently was seen by CT surgery underwent CABG x 4 (LIMA  LAD, VG  D1, VG  OM, VG  RPDA).  Echocardiogram during that admission showed EF 50 to 55% with normal RV function, and mild AI.  Postoperative course was relatively uncomplicated.   Patient was last seen in cardiology clinic in November 2025, at which time he continued to smoke but was not having chest pain.  LP(a) was subsequently found to be elevated at 233.5.  Recommendation was made for PCSK9 inhibitor.  On January 16, Jacob Black was at a bar in [place] with a few friends.  He and his friends frequent this bar 2-3 x/wk and he'll typically have 2 drinks.  On this particular occasion, he had sudden onset of fluttering in his chest followed by sharp pain, lasting a few seconds, and resolving spontaneously.  Because of the symptoms, his friends recommended that he be seen in the ED.  He went to [facility] and notes that he had no recurrence of symptoms while in the waiting room.  Lab work was evaluated and notable  for normal troponin.  EKG unchanged from prior.  Patient left prior to being formally evaluated.  Since his ED visit, he has had no recurrence of symptoms.  He is eager to start cardiac rehabilitation and presented yesterday but was advised that he would require clearance first.  Since his surgery, he does note easy fatigability.  He denies chest pain, dyspnea, PND, orthopnea, dizziness, syncope, edema, or early satiety.  He has not smoked since his surgery and remains compliant with his medications. Objective   Home Medications     Current Outpatient Medications  Medication Sig Dispense Refill   aspirin  EC 81 MG tablet Take 1 tablet (81 mg total) by mouth daily. Swallow whole.     colchicine  0.6 MG tablet Take 0.6-1.2 mg by mouth 2 (two) times daily as needed (gout flare).     Febuxostat  80 MG TABS Take 1 tablet by mouth daily.     acetaminophen  (TYLENOL ) 325 MG tablet Take 2 tablets (650 mg total) by mouth every 6 (six) hours as needed for mild pain (pain score 1-3). (Patient not taking: Reported on 09/15/2024)     clopidogrel  (PLAVIX ) 75 MG tablet Take 1 tablet (75 mg total) by mouth daily. 90 tablet 3   folic acid  (FOLVITE ) 1 MG tablet Take 1 tablet (1 mg total) by mouth daily. (Patient not taking: Reported on 09/24/2024)     metoprolol  tartrate (LOPRESSOR ) 25 MG tablet Take 1 tablet (25 mg total) by mouth 2 (two) times daily. 180 tablet 3   Multiple Vitamin (MULTIVITAMIN WITH MINERALS) TABS tablet Take 1 tablet by mouth daily. (Patient not taking: Reported on 09/24/2024)     nystatin -triamcinolone  ointment (MYCOLOG) Apply 1 Application topically 2 (two) times daily. (Patient not taking: Reported on 09/24/2024) 30 g 0   oxyCODONE  (OXY IR/ROXICODONE ) 5 MG immediate release tablet Take 5 mg by mouth every 4 (four) hours as needed for severe pain (pain score 7-10). (Patient not taking: Reported on 09/24/2024)     predniSONE  (STERAPRED UNI-PAK 21 TAB) 5 MG (21) TBPK tablet Take 5 mg by mouth daily. (Patient not taking: Reported on 09/24/2024)     rosuvastatin  (CRESTOR ) 40 MG tablet Take 1 tablet (40 mg total) by mouth daily. 90 tablet 3   thiamine  (VITAMIN B-1) 100 MG tablet Take 1 tablet (100 mg total) by mouth daily. (Patient not taking: Reported on 09/24/2024)     No current facility-administered medications for this visit.     Physical Exam    VS:  BP 100/64 (BP Location: Left Arm, Patient Position: Sitting, Cuff Size: Normal)   Pulse 62   Ht 5' 9 (1.753 m)   Wt 201 lb (91.2 kg)   SpO2 98%   BMI 29.68 kg/m  , BMI  Body mass index is 29.68 kg/m.       **Ready to participate in cardiac rehab from cardiology perspective.   GEN: Well nourished, well developed, in no acute distress. HEENT: normal. Neck: Supple, no JVD, carotid bruits, or masses. Cardiac: RRR, no murmurs, rubs, or gallops. No clubbing, cyanosis, edema.  Radials 2+/PT 2+ and equal bilaterally.  Respiratory:  Respirations regular and unlabored, clear to auscultation bilaterally. GI: Soft, nontender, nondistended, BS + x 4. MS: no deformity or atrophy. Skin: warm and dry, no rash. Neuro:  Strength and sensation are intact. Psych: Normal affect.  Accessory Clinical Findings    ECG personally reviewed by me today - EKG Interpretation Date/Time:  Thursday September 24 2024 08:24:57 EST Ventricular Rate:  62  PR Interval:  238 QRS Duration:  84 QT Interval:  398 QTC Calculation: 403 R Axis:   -5  Text Interpretation: Sinus rhythm with 1st degree A-V block Confirmed by Vivienne Bruckner [phone]) on 09/24/2024 8:34:43 AM   - no acute changes.  Lab Results  Component Value Date   WBC 9.2 09/18/2024   HGB 13.6 09/18/2024   HCT 41.2 09/18/2024   MCV 93.6 09/18/2024   PLT 261 09/18/2024   Lab Results  Component Value Date   CREATININE 1.11 09/18/2024   BUN 17 09/18/2024   NA 138 09/18/2024   K 4.0 09/18/2024   CL 102 09/18/2024   CO2 21 (L) 09/18/2024   Lab Results  Component Value Date   ALT 26 09/18/2024   AST 28 09/18/2024   ALKPHOS 72 09/18/2024   BILITOT 0.2 09/18/2024   Lab Results  Component Value Date   CHOL 202 (H) 06/10/2024   HDL 40 (L) 06/10/2024   LDLCALC 111 (H) 06/10/2024   TRIG 254 (H) 06/10/2024   CHOLHDL 5.1 06/10/2024    LP(a) 233.5   Lab Results  Component Value Date   HGBA1C 5.2 06/10/2024   Lab Results  Component Value Date   TSH 2.260 06/10/2024       Assessment & Plan    1.  Coronary artery disease/sharp chest pain: Status post inferior STEMI in October 2025 with subsequent PTCA of  the right coronary artery as a bridge to CABG x 4 (LIMA  LAD, VG  D1, VG  OM, VG  RPDA).  Patient has noted easy fatigability since his surgery but does typically experience chest pain or dyspnea with usual activities.  He did have a brief episode of sharp chest pain associated with heart fluttering, lasting just a few seconds, resolving spontaneously, for which she was seen in the emergency department with normal troponin.  No recurrent events.  ECG unremarkable today.  Given brief and somewhat atypical symptoms, we will not pursue additional ischemic evaluation at this time.  He may proceed with participation in cardiac rehabilitation as planned.  Continue aspirin , Plavix , beta-blocker, and rosuvastatin , which were refilled today.  2.  Ischemic cardiomyopathy: EF is 45 to 50% with inferior hypokinesis to akinesis on left ventriculography at the time of his event and 50-55% by echo.  He is euvolemic on examination.  He is on beta-blocker therapy in the setting of soft blood pressures and low normal EF, he has not been on acei/arb/arni/mra.  3.  Hyperlipidemia/elevated LP(a): LDL of 111 during his hospitalization October 2025.  He is on rosuvastatin  40 mg and we have not repeated lipids since, though he thinks he may have just had lipids drawn through his primary care provider's office, though their labs do not report out to epic.  I asked him to obtain that lab work and if lipids were not included, we can arrange for follow-up.  He is scheduled to see our pharmacist in February to discuss an injectable agent, though if LDL less than 55, this may not be necessary.  We did discuss that at this time, there are no approved therapies for elevated LP(a).  4.  Tobacco abuse: Patient quit at the time of his surgery in October 2025.  I congratulated him on this and encouraged him to remain off of cigarettes.  5.  Palpitations: Patient with a brief episode of fluttering in his chest lasting just a few seconds, prior  to his ED evaluation.  He has not any recurrence.  We discussed potential pursuit of event monitoring though in the absence of recurrence of symptoms, likely to be low yield.  We agreed to hold off for now but can reconsider if he has recurrent palpitations.  Continue low-dose beta-blocker.  6.  Alcohol use: Estimates that he has 2 drinks about 3 nights a week.  Discussed the importance of reduction and/or cessation of alcohol intake in the setting of no safe level of alcohol.    7.  Disposition: Follow-up in 3 months or sooner if necessary.  Lonni Meager, NP 09/24/2024, 8:56 AM     [1] No Known Allergies  "

## 2024-09-24 NOTE — Patient Instructions (Signed)
 Medication Instructions:  No changes *If you need a refill on your cardiac medications before your next appointment, please call your pharmacy*  Lab Work: None ordered If you have labs (blood work) drawn today and your tests are completely normal, you will receive your results only by: MyChart Message (if you have MyChart) OR A paper copy in the mail If you have any lab test that is abnormal or we need to change your treatment, we will call you to review the results.  Testing/Procedures: None ordered  Follow-Up: At [facility], you and your health needs are our priority.  As part of our continuing mission to provide you with exceptional heart care, our providers are all part of one team.  This team includes your primary Cardiologist (physician) and Advanced Practice Providers or APPs (Physician Assistants and Nurse Practitioners) who all work together to provide you with the care you need, when you need it.  Your next appointment:   3 month(s)  Provider:   Gordy Bergamo, MD    We recommend signing up for the patient portal called MyChart.  Sign up information is provided on this After Visit Summary.  MyChart is used to connect with patients for Virtual Visits (Telemedicine).  Patients are able to view lab/test results, encounter notes, upcoming appointments, etc.  Non-urgent messages can be sent to your provider as well.   To learn more about what you can do with MyChart, go to [email or web address].

## 2024-09-25 ENCOUNTER — Encounter (HOSPITAL_COMMUNITY)
Admission: RE | Admit: 2024-09-25 | Discharge: 2024-09-25 | Disposition: A | Source: Ambulatory Visit | Attending: Cardiology

## 2024-09-25 ENCOUNTER — Encounter (HOSPITAL_COMMUNITY)

## 2024-09-25 DIAGNOSIS — Z951 Presence of aortocoronary bypass graft: Secondary | ICD-10-CM

## 2024-09-25 DIAGNOSIS — I213 ST elevation (STEMI) myocardial infarction of unspecified site: Secondary | ICD-10-CM

## 2024-09-25 NOTE — Progress Notes (Signed)
 Daily Session Note  Patient Details  Name: Jacob Black MRN: [id number] Date of Birth: Nov 12, 1960 Referring Provider:   Flowsheet Row CARDIAC REHAB PHASE II ORIENTATION from 09/15/2024 in [facility]  Referring Provider Ladona Heinz, MD    Encounter Date: 09/25/2024  Check In:  Session Check In - 09/25/24 0716       Check-In   Supervising physician immediately available to respond to emergencies [facility] MD immediately available    Physician(s) Barnie Reader, NP    Location [facility]    Staff Present Fairy Music, RN, BSN;Jetta Walker BS, ACSM-CEP, Exercise Physiologist;Kaylee Nicholaus, MS, ACSM-CEP, Exercise Physiologist    Virtual Visit No    Medication changes reported     No    Fall or balance concerns reported    No    Tobacco Cessation No Change    Warm-up and Cool-down Performed as group-led instruction    Resistance Training Performed Yes    VAD Patient? No    PAD/SET Patient? No      Pain Assessment   Currently in Pain? No/denies    Pain Score 0-No pain    Multiple Pain Sites No          Capillary Blood Glucose: No results found for this or any previous visit (from the past 24 hours).   Exercise Prescription Changes - 09/25/24 0830       Response to Exercise   Blood Pressure (Admit) 116/68    Blood Pressure (Exercise) 134/80    Blood Pressure (Exit) 110/64    Heart Rate (Admit) 87 bpm    Heart Rate (Exercise) 116 bpm    Heart Rate (Exit) 69 bpm    Rating of Perceived Exertion (Exercise) 12    Perceived Dyspnea (Exercise) 0    Symptoms None    Comments Pt first day in teh [facility] ICR program    Duration Progress to 30 minutes of  aerobic without signs/symptoms of physical distress    Intensity THRR unchanged      Progression   Progression Continue to progress workloads to maintain intensity without signs/symptoms of physical distress.    Average METs 3.51      Resistance  Training   Weight 3 lbs    Reps 10-15    Time 10 Minutes      Treadmill   MPH 2.3    Grade 3    Minutes 15    METs 3.71      Recumbant Elliptical   Level 2    RPM 59    Watts 80    Minutes 15    METs 3.3          Tobacco Use History[1]  Goals Met:  Exercise tolerated well No report of concerns or symptoms today Strength training completed today  Goals Unmet:  Not Applicable  Comments: Pt started cardiac rehab today.  Pt tolerated light exercise without difficulty. VSS, telemetry-SR with 1st deg HB, asymptomatic.  Medication list reconciled. Pt denies barriers to medication compliance.  PSYCHOSOCIAL ASSESSMENT:  PHQ-7. Pt exhibits positive coping skills, hopeful outlook with supportive family. No psychosocial needs identified at this time, no psychosocial interventions necessary.    Pt enjoys boating and billiards.   Pt oriented to exercise equipment and routine.    Understanding verbalized.     Dr. Wilbert Bihari is Medical Director for Cardiac Rehab at [facility].    [1]  Social History Tobacco  Use  Smoking Status Former   Current packs/day: 0.00   Average packs/day: 0.5 packs/day for 49.8 years (24.9 ttl pk-yrs)   Types: Cigarettes   Start date: 09/03/1974   Quit date: 06/09/2024   Years since quitting: 0.2  Smokeless Tobacco Never

## 2024-09-28 ENCOUNTER — Encounter (HOSPITAL_COMMUNITY)

## 2024-09-29 NOTE — Progress Notes (Signed)
 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 07/2023 Cardiac CT: Ca2+ = 258; b. 06/2024 Inferior STEMI/PTCA: LM 40d, hazy, LAD 99ost/p, 77m/d, D1 80ost, LCX 99ost, OM1 99ost, 80p, OM2 nl, RCA 22m thrombotic (PTCA), EF 45-50, inf HK/AK; c. 06/2024 CABG x 4: LIMA->LAD, VG->OM, VG->RPDA, VG->D1.   Elevated Lp(a)    a. 06/2024 LP(a) = 233.5.   Gout    Hyperlipidemia LDL goal <55    Tobacco abuse     Tobacco Use: Tobacco Use History[2]  Labs: Review Flowsheet       Latest Ref Rng & Units 04/25/2015 06/09/2024 06/10/2024 06/13/2024  Labs for ITP Cardiac and Pulmonary Rehab  Cholestrol 0 - 200 mg/dL - - 797  -  LDL (calc) 0 - 99 mg/dL - - 888  -  HDL-C >59 mg/dL - - 40  -  Trlycerides <150 mg/dL - - 745  -  Hemoglobin A1c 4.8 - 5.6 % - - 5.2  -  PH, Arterial 7.35 - 7.45 - - - 7.367  7.368  7.396  7.384  7.423  7.358  7.352   PCO2 arterial 32 - 48 mmHg - - - 36.7  37.6  33.7  37.7  34.3  43.1  32.1   Bicarbonate 20.0 - 28.0 mmol/L - - - 21.0  21.6  20.9  22.5  22.4  24.0  24.2  17.8   TCO2 22 - 32 mmol/L 21  23  20  22  23  22  24  23  25  23  23  25  25  23  19  19    Acid-base deficit 0.0 - 2.0 mmol/L - - - 4.0  3.0  4.0  2.0  2.0  2.0  1.0  7.0   O2 Saturation % - - - 97  99  99  100  100  90  100  100     Details        Multiple values from one day are sorted in reverse-chronological order         Capillary Blood Glucose: Lab Results  Component Value Date   GLUCAP 119 (H) 06/15/2024   GLUCAP 101 (H) 06/15/2024   GLUCAP 106 (H) 06/14/2024   GLUCAP 90 06/14/2024   GLUCAP 100 (H) 06/14/2024     Exercise Target Goals: Exercise Program Goal: Individual exercise prescription set using results from initial 6 min walk test and THRR while considering  patients activity barriers and safety.   Exercise Prescription Goal: Initial exercise prescription builds to 30-45 minutes a day of aerobic activity, 2-3 days per week.  Home exercise guidelines will be given to patient during program as part of exercise prescription that the participant will acknowledge.  Activity Barriers & Risk Stratification:  Activity Barriers & Cardiac Risk Stratification - 09/15/24 0850  Activity Barriers & Cardiac Risk Stratification   Activity Barriers Other (comment)    Comments Bilateral foot pain from gout.    Cardiac Risk Stratification High          6 Minute Walk:  6 Minute Walk     Row Name 09/15/24 0901         6 Minute Walk   Phase Initial     Distance 1641 feet     Walk Time 6 minutes     # of Rest Breaks 0     MPH 3.11     METS 3.57     RPE 10     Perceived Dyspnea  1     VO2 Peak 12.51     Symptoms Yes (comment)     Comments Mild shortness of breath. Bilateral foot pain, 6-7/10 on pain scale, chronic from gout.     Resting HR 56 bpm     Resting BP 118/78     Resting Oxygen  Saturation  97 %     Exercise Oxygen  Saturation  during 6 min walk 99 %     Max Ex. HR 75 bpm     Max Ex. BP 126/80     2 Minute Post BP 130/78        Oxygen  Initial Assessment:   Oxygen  Re-Evaluation:   Oxygen  Discharge (Final Oxygen  Re-Evaluation):   Initial Exercise Prescription:  Initial Exercise Prescription - 09/15/24 0900       Date of Initial Exercise RX and Referring Provider   Date  09/15/24    Referring Provider Ladona Heinz, MD    Expected Discharge Date 10/30/24      Treadmill   MPH 2.4    Grade 0    Minutes 15    METs 2.84      Recumbant Elliptical   Level 1    RPM 40    Watts 40    Minutes 15    METs 2.8      Prescription Details   Frequency (times per week) 3    Duration Progress to 30 minutes of continuous aerobic without signs/symptoms of physical distress      Intensity   THRR 40-80% of Max Heartrate 64-127    Ratings of Perceived Exertion 11-13    Perceived Dyspnea 0-4      Progression   Progression Continue to progress workloads to maintain intensity without signs/symptoms of physical distress.      Resistance Training   Training Prescription Yes    Weight 3 lbs    Reps 10-15          Perform Capillary Blood Glucose checks as needed.  Exercise Prescription Changes:   Exercise Prescription Changes     Row Name 09/25/24 0830             Response to Exercise   Blood Pressure (Admit) 116/68       Blood Pressure (Exercise) 134/80       Blood Pressure (Exit) 110/64       Heart Rate (Admit) 87 bpm       Heart Rate (Exercise) 116 bpm       Heart Rate (Exit) 69 bpm       Rating of Perceived Exertion (Exercise) 12       Perceived Dyspnea (Exercise) 0       Symptoms None       Comments Pt first day in teh Pritikin ICR program       Duration Progress  to 30 minutes of  aerobic without signs/symptoms of physical distress       Intensity THRR unchanged         Progression   Progression Continue to progress workloads to maintain intensity without signs/symptoms of physical distress.       Average METs 3.51         Resistance Training   Weight 3 lbs       Reps 10-15       Time 10 Minutes         Treadmill   MPH 2.3       Grade 3       Minutes 15       METs 3.71         Recumbant Elliptical   Level 2       RPM 59       Watts 80       Minutes 15       METs 3.3          Exercise Comments:   Exercise Comments      Row Name 09/25/24 [phone]           Exercise Comments Pt first day in teh Pritikin ICR program. Pt tolerated exercise well with an average MEt level of 3.51. Pt is learning his THRR, RPE and ExRx. Off to a great start          Exercise Goals and Review:   Exercise Goals     Row Name 09/15/24 0834             Exercise Goals   Increase Physical Activity Yes       Intervention Provide advice, education, support and counseling about physical activity/exercise needs.;Develop an individualized exercise prescription for aerobic and resistive training based on initial evaluation findings, risk stratification, comorbidities and participant's personal goals.       Expected Outcomes Short Term: Attend rehab on a regular basis to increase amount of physical activity.;Long Term: Add in home exercise to make exercise part of routine and to increase amount of physical activity.;Long Term: Exercising regularly at least 3-5 days a week.       Increase Strength and Stamina Yes       Intervention Provide advice, education, support and counseling about physical activity/exercise needs.;Develop an individualized exercise prescription for aerobic and resistive training based on initial evaluation findings, risk stratification, comorbidities and participant's personal goals.       Expected Outcomes Short Term: Increase workloads from initial exercise prescription for resistance, speed, and METs.;Short Term: Perform resistance training exercises routinely during rehab and add in resistance training at home;Long Term: Improve cardiorespiratory fitness, muscular endurance and strength as measured by increased METs and functional capacity ( )       Able to understand and use rate of perceived exertion (RPE) scale Yes       Intervention Provide education and explanation on how to use RPE scale       Expected Outcomes Short Term: Able to use RPE daily in rehab to express subjective intensity level;Long Term:  Able to  use RPE to guide intensity level when exercising independently       Knowledge and understanding of Target Heart Rate Range (THRR) Yes       Intervention Provide education and explanation of THRR including how the numbers were predicted and where they are located for reference       Expected Outcomes Short Term: Able to state/look up THRR;Long Term: Able  to use THRR to govern intensity when exercising independently;Short Term: Able to use daily as guideline for intensity in rehab       Able to check pulse independently Yes       Intervention Provide education and demonstration on how to check pulse in carotid and radial arteries.;Review the importance of being able to check your own pulse for safety during independent exercise       Expected Outcomes Short Term: Able to explain why pulse checking is important during independent exercise;Long Term: Able to check pulse independently and accurately       Understanding of Exercise Prescription Yes       Intervention Provide education, explanation, and written materials on patient's individual exercise prescription       Expected Outcomes Short Term: Able to explain program exercise prescription;Long Term: Able to explain home exercise prescription to exercise independently          Exercise Goals Re-Evaluation :  Exercise Goals Re-Evaluation     Row Name 09/25/24 9167             Exercise Goal Re-Evaluation   Exercise Goals Review Increase Physical Activity;Understanding of Exercise Prescription;Increase Strength and Stamina;Knowledge and understanding of Target Heart Rate Range (THRR);Able to understand and use rate of perceived exertion (RPE) scale       Comments Pt first day in teh Pritikin ICR program. Pt tolerated exercise well with an average MEt level of 3.51. Pt is learning his THRR, RPE and ExRx. Off to a great start       Expected Outcomes will continue to monitor pr and progress workloads as tolerated without sign or symptom           Discharge Exercise Prescription (Final Exercise Prescription Changes):  Exercise Prescription Changes - 09/25/24 0830       Response to Exercise   Blood Pressure (Admit) 116/68    Blood Pressure (Exercise) 134/80    Blood Pressure (Exit) 110/64    Heart Rate (Admit) 87 bpm    Heart Rate (Exercise) 116 bpm    Heart Rate (Exit) 69 bpm    Rating of Perceived Exertion (Exercise) 12    Perceived Dyspnea (Exercise) 0    Symptoms None    Comments Pt first day in teh Pritikin ICR program    Duration Progress to 30 minutes of  aerobic without signs/symptoms of physical distress    Intensity THRR unchanged      Progression   Progression Continue to progress workloads to maintain intensity without signs/symptoms of physical distress.    Average METs 3.51      Resistance Training   Weight 3 lbs    Reps 10-15    Time 10 Minutes      Treadmill   MPH 2.3    Grade 3    Minutes 15    METs 3.71      Recumbant Elliptical   Level 2    RPM 59    Watts 80    Minutes 15    METs 3.3          Nutrition:  Target Goals: Understanding of nutrition guidelines, daily intake of sodium 1500mg , cholesterol 200mg , calories 30% from fat and 7% or less from saturated fats, daily to have 5 or more servings of fruits and vegetables.  Biometrics:  Pre Biometrics - 09/15/24 0758       Pre Biometrics   Waist Circumference 41 inches    Hip Circumference 40.5 inches  Waist to Hip Ratio 1.01 %    Triceps Skinfold 10 mm    % Body Fat 27 %    Grip Strength 28 kg    Flexibility 0 in   unable to reach box   Single Leg Stand 30 seconds           Nutrition Therapy Plan and Nutrition Goals:   Nutrition Assessments:  MEDIFICTS Score Key: >=70 Need to make dietary changes  40-70 Heart Healthy Diet <= 40 Therapeutic Level Cholesterol Diet    Picture Your Plate Scores: <59 Unhealthy dietary pattern with much room for improvement. 41-50 Dietary pattern unlikely to meet  recommendations for good health and room for improvement. 51-60 More healthful dietary pattern, with some room for improvement.  >60 Healthy dietary pattern, although there may be some specific behaviors that could be improved.    Nutrition Goals Re-Evaluation:   Nutrition Goals Re-Evaluation:   Nutrition Goals Discharge (Final Nutrition Goals Re-Evaluation):   Psychosocial: Target Goals: Acknowledge presence or absence of significant depression and/or stress, maximize coping skills, provide positive support system. Participant is able to verbalize types and ability to use techniques and skills needed for reducing stress and depression.  Initial Review & Psychosocial Screening:  Initial Psych Review & Screening - 09/15/24 0915       Initial Review   Current issues with Current Sleep Concerns;Current Stress Concerns    Source of Stress Concerns Occupation      Family Dynamics   Good Support System? Yes   Jacob Black has his wife for support     Barriers   Psychosocial barriers to participate in program There are no identifiable barriers or psychosocial needs.      Screening Interventions   Interventions Encouraged to exercise          Quality of Life Scores:  Quality of Life - 09/15/24 0833       Quality of Life   Select Quality of Life      Quality of Life Scores   Health/Function Pre 23.17 %    Socioeconomic Pre 23.21 %    Psych/Spiritual Pre 23.21 %    Family Pre 20.5 %    GLOBAL Pre 22.79 %         Scores of 19 and below usually indicate a poorer quality of life in these areas.  A difference of  2-3 points is a clinically meaningful difference.  A difference of 2-3 points in the total score of the Quality of Life Index has been associated with significant improvement in overall quality of life, self-image, physical symptoms, and general health in studies assessing change in quality of life.  PHQ-9: Review Flowsheet       09/15/2024 10/13/2015 08/10/2015   Depression screen PHQ 2/9  Decreased Interest 3 0 0 0  Down, Depressed, Hopeless 0 0 0 0  PHQ - 2 Score 3 0 0 0  Altered sleeping 1 - -  Tired, decreased energy 3 - -  Change in appetite 0 - -  Feeling bad or failure about yourself  0 - -  Trouble concentrating 0 - -  Moving slowly or fidgety/restless 0 - -  Suicidal thoughts 0 - -  PHQ-9 Score 7 - -  Difficult doing work/chores Not difficult at all - -    Details       Multiple values from one day are sorted in reverse-chronological order        Interpretation of Total Score  Total Score Depression  Severity:  1-4 = Minimal depression, 5-9 = Mild depression, 10-14 = Moderate depression, 15-19 = Moderately severe depression, 20-27 = Severe depression   Psychosocial Evaluation and Intervention:   Psychosocial Re-Evaluation:  Psychosocial Re-Evaluation     Row Name 09/25/24 1451             Psychosocial Re-Evaluation   Current issues with Current Stress Concerns;Current Sleep Concerns       Comments Jacob Black has not voiced any additional stressors or concerns today on his first day of cardiac rehab.       Expected Outcomes Jacob Black will voice decreased or controlled stressors/concerns by completion of cardiac rehab.       Interventions Encouraged to attend Cardiac Rehabilitation for the exercise;Relaxation education;Stress management education       Continue Psychosocial Services  Follow up required by staff          Psychosocial Discharge (Final Psychosocial Re-Evaluation):  Psychosocial Re-Evaluation - 09/25/24 1451       Psychosocial Re-Evaluation   Current issues with Current Stress Concerns;Current Sleep Concerns    Comments Jacob Black has not voiced any additional stressors or concerns today on his first day of cardiac rehab.    Expected Outcomes Jacob Black will voice decreased or controlled stressors/concerns by completion of cardiac rehab.    Interventions Encouraged to attend Cardiac Rehabilitation for the  exercise;Relaxation education;Stress management education    Continue Psychosocial Services  Follow up required by staff          Vocational Rehabilitation: Provide vocational rehab assistance to qualifying candidates.   Vocational Rehab Evaluation & Intervention:  Vocational Rehab - 09/15/24 0827       Initial Vocational Rehab Evaluation & Intervention   Assessment shows need for Vocational Rehabilitation No   Jacob Black has returned to work and does not need vocational rehab at this time         Education: Education Goals: Education classes will be provided on a weekly basis, covering required topics. Participant will state understanding/return demonstration of topics presented.     Core Videos: Exercise    Move It!  Clinical staff conducted group or individual video education with verbal and written material and guidebook.  Patient learns the recommended Pritikin exercise program. Exercise with the goal of living a long, healthy life. Some of the health benefits of exercise include controlled diabetes, healthier blood pressure levels, improved cholesterol levels, improved heart and lung capacity, improved sleep, and better body composition. Everyone should speak with their doctor before starting or changing an exercise routine.  Biomechanical Limitations Clinical staff conducted group or individual video education with verbal and written material and guidebook.  Patient learns how biomechanical limitations can impact exercise and how we can mitigate and possibly overcome limitations to have an impactful and balanced exercise routine.  Body Composition Clinical staff conducted group or individual video education with verbal and written material and guidebook.  Patient learns that body composition (ratio of muscle mass to fat mass) is a key component to assessing overall fitness, rather than body weight alone. Increased fat mass, especially visceral belly fat, can put us  at increased  risk for metabolic syndrome, type 2 diabetes, heart disease, and even death. It is recommended to combine diet and exercise (cardiovascular and resistance training) to improve your body composition. Seek guidance from your physician and exercise physiologist before implementing an exercise routine.  Exercise Action Plan Clinical staff conducted group or individual video education with verbal and written material and guidebook.  Patient learns  the recommended strategies to achieve and enjoy long-term exercise adherence, including variety, self-motivation, self-efficacy, and positive decision making. Benefits of exercise include fitness, good health, weight management, more energy, better sleep, less stress, and overall well-being.  Medical   Heart Disease Risk Reduction Clinical staff conducted group or individual video education with verbal and written material and guidebook.  Patient learns our heart is our most vital organ as it circulates oxygen , nutrients, white blood cells, and hormones throughout the entire body, and carries waste away. Data supports a plant-based eating plan like the Pritikin Program for its effectiveness in slowing progression of and reversing heart disease. The video provides a number of recommendations to address heart disease.   Metabolic Syndrome and Belly Fat  Clinical staff conducted group or individual video education with verbal and written material and guidebook.  Patient learns what metabolic syndrome is, how it leads to heart disease, and how one can reverse it and keep it from coming back. You have metabolic syndrome if you have 3 of the following 5 criteria: abdominal obesity, high blood pressure, high triglycerides, low HDL cholesterol, and high blood sugar.  Hypertension and Heart Disease Clinical staff conducted group or individual video education with verbal and written material and guidebook.  Patient learns that high blood pressure, or hypertension, is  very common in the [place] . Hypertension is largely due to excessive salt intake, but other important risk factors include being overweight, physical inactivity, drinking too much alcohol, smoking, and not eating enough potassium from fruits and vegetables. High blood pressure is a leading risk factor for heart attack, stroke, congestive heart failure, dementia, kidney failure, and premature death. Long-term effects of excessive salt intake include stiffening of the arteries and thickening of heart muscle and organ damage. Recommendations include ways to reduce hypertension and the risk of heart disease.  Diseases of Our Time - Focusing on Diabetes Clinical staff conducted group or individual video education with verbal and written material and guidebook.  Patient learns why the best way to stop diseases of our time is prevention, through food and other lifestyle changes. Medicine (such as prescription pills and surgeries) is often only a Band-Aid on the problem, not a long-term solution. Most common diseases of our time include obesity, type 2 diabetes, hypertension, heart disease, and cancer. The Pritikin Program is recommended and has been proven to help reduce, reverse, and/or prevent the damaging effects of metabolic syndrome.  Nutrition   Overview of the Pritikin Eating Plan  Clinical staff conducted group or individual video education with verbal and written material and guidebook.  Patient learns about the Pritikin Eating Plan for disease risk reduction. The Pritikin Eating Plan emphasizes a wide variety of unrefined, minimally-processed carbohydrates, like fruits, vegetables, whole grains, and legumes. Go, Caution, and Stop food choices are explained. Plant-based and lean animal proteins are emphasized. Rationale provided for low sodium intake for blood pressure control, low added sugars for blood sugar stabilization, and low added fats and oils for coronary artery disease risk reduction and  weight management.  Calorie Density  Clinical staff conducted group or individual video education with verbal and written material and guidebook.  Patient learns about calorie density and how it impacts the Pritikin Eating Plan. Knowing the characteristics of the food you choose will help you decide whether those foods will lead to weight gain or weight loss, and whether you want to consume more or less of them. Weight loss is usually a side effect of the Pritikin Eating Plan  because of its focus on low calorie-dense foods.  Label Reading  Clinical staff conducted group or individual video education with verbal and written material and guidebook.  Patient learns about the Pritikin recommended label reading guidelines and corresponding recommendations regarding calorie density, added sugars, sodium content, and whole grains.  Dining Out - Part 1  Clinical staff conducted group or individual video education with verbal and written material and guidebook.  Patient learns that restaurant meals can be sabotaging because they can be so high in calories, fat, sodium, and/or sugar. Patient learns recommended strategies on how to positively address this and avoid unhealthy pitfalls.  Facts on Fats  Clinical staff conducted group or individual video education with verbal and written material and guidebook.  Patient learns that lifestyle modifications can be just as effective, if not more so, as many medications for lowering your risk of heart disease. A Pritikin lifestyle can help to reduce your risk of inflammation and atherosclerosis (cholesterol build-up, or plaque, in the artery walls). Lifestyle interventions such as dietary choices and physical activity address the cause of atherosclerosis. A review of the types of fats and their impact on blood cholesterol levels, along with dietary recommendations to reduce fat intake is also included.  Nutrition Action Plan  Clinical staff conducted group or  individual video education with verbal and written material and guidebook.  Patient learns how to incorporate Pritikin recommendations into their lifestyle. Recommendations include planning and keeping personal health goals in mind as an important part of their success.  Healthy Mind-Set    Healthy Minds, Bodies, Hearts  Clinical staff conducted group or individual video education with verbal and written material and guidebook.  Patient learns how to identify when they are stressed. Video will discuss the impact of that stress, as well as the many benefits of stress management. Patient will also be introduced to stress management techniques. The way we think, act, and feel has an impact on our hearts.  How Our Thoughts Can Heal Our Hearts  Clinical staff conducted group or individual video education with verbal and written material and guidebook.  Patient learns that negative thoughts can cause depression and anxiety. This can result in negative lifestyle behavior and serious health problems. Cognitive behavioral therapy is an effective method to help control our thoughts in order to change and improve our emotional outlook.  Additional Videos:  Exercise    Improving Performance  Clinical staff conducted group or individual video education with verbal and written material and guidebook.  Patient learns to use a non-linear approach by alternating intensity levels and lengths of time spent exercising to help burn more calories and lose more body fat. Cardiovascular exercise helps improve heart health, metabolism, hormonal balance, blood sugar control, and recovery from fatigue. Resistance training improves strength, endurance, balance, coordination, reaction time, metabolism, and muscle mass. Flexibility exercise improves circulation, posture, and balance. Seek guidance from your physician and exercise physiologist before implementing an exercise routine and learn your capabilities and proper form for  all exercise.  Introduction to Yoga  Clinical staff conducted group or individual video education with verbal and written material and guidebook.  Patient learns about yoga, a discipline of the coming together of mind, breath, and body. The benefits of yoga include improved flexibility, improved range of motion, better posture and core strength, increased lung function, weight loss, and positive self-image. Yogas heart health benefits include lowered blood pressure, healthier heart rate, decreased cholesterol and triglyceride levels, improved immune function, and reduced stress. Seek  guidance from your physician and exercise physiologist before implementing an exercise routine and learn your capabilities and proper form for all exercise.  Medical   Aging: Enhancing Your Quality of Life  Clinical staff conducted group or individual video education with verbal and written material and guidebook.  Patient learns key strategies and recommendations to stay in good physical health and enhance quality of life, such as prevention strategies, having an advocate, securing a Health Care Proxy and Power of Attorney, and keeping a list of medications and system for tracking them. It also discusses how to avoid risk for bone loss.  Biology of Weight Control  Clinical staff conducted group or individual video education with verbal and written material and guidebook.  Patient learns that weight gain occurs because we consume more calories than we burn (eating more, moving less). Even if your body weight is normal, you may have higher ratios of fat compared to muscle mass. Too much body fat puts you at increased risk for cardiovascular disease, heart attack, stroke, type 2 diabetes, and obesity-related cancers. In addition to exercise, following the Pritikin Eating Plan can help reduce your risk.  Decoding Lab Results  Clinical staff conducted group or individual video education with verbal and written material and  guidebook.  Patient learns that lab test reflects one measurement whose values change over time and are influenced by many factors, including medication, stress, sleep, exercise, food, hydration, pre-existing medical conditions, and more. It is recommended to use the knowledge from this video to become more involved with your lab results and evaluate your numbers to speak with your doctor.   Diseases of Our Time - Overview  Clinical staff conducted group or individual video education with verbal and written material and guidebook.  Patient learns that according to the CDC, 50% to 70% of chronic diseases (such as obesity, type 2 diabetes, elevated lipids, hypertension, and heart disease) are avoidable through lifestyle improvements including healthier food choices, listening to satiety cues, and increased physical activity.  Sleep Disorders Clinical staff conducted group or individual video education with verbal and written material and guidebook.  Patient learns how good quality and duration of sleep are important to overall health and well-being. Patient also learns about sleep disorders and how they impact health along with recommendations to address them, including discussing with a physician.  Nutrition  Dining Out - Part 2 Clinical staff conducted group or individual video education with verbal and written material and guidebook.  Patient learns how to plan ahead and communicate in order to maximize their dining experience in a healthy and nutritious manner. Included are recommended food choices based on the type of restaurant the patient is visiting.   Fueling a Banker conducted group or individual video education with verbal and written material and guidebook.  There is a strong connection between our food choices and our health. Diseases like obesity and type 2 diabetes are very prevalent and are in large-part due to lifestyle choices. The Pritikin Eating Plan  provides plenty of food and hunger-curbing satisfaction. It is easy to follow, affordable, and helps reduce health risks.  Menu Workshop  Clinical staff conducted group or individual video education with verbal and written material and guidebook.  Patient learns that restaurant meals can sabotage health goals because they are often packed with calories, fat, sodium, and sugar. Recommendations include strategies to plan ahead and to communicate with the manager, chef, or server to help order a healthier meal.  Planning Your  Eating Strategy  Clinical staff conducted group or individual video education with verbal and written material and guidebook.  Patient learns about the Pritikin Eating Plan and its benefit of reducing the risk of disease. The Pritikin Eating Plan does not focus on calories. Instead, it emphasizes high-quality, nutrient-rich foods. By knowing the characteristics of the foods, we choose, we can determine their calorie density and make informed decisions.  Targeting Your Nutrition Priorities  Clinical staff conducted group or individual video education with verbal and written material and guidebook.  Patient learns that lifestyle habits have a tremendous impact on disease risk and progression. This video provides eating and physical activity recommendations based on your personal health goals, such as reducing LDL cholesterol, losing weight, preventing or controlling type 2 diabetes, and reducing high blood pressure.  Vitamins and Minerals  Clinical staff conducted group or individual video education with verbal and written material and guidebook.  Patient learns different ways to obtain key vitamins and minerals, including through a recommended healthy diet. It is important to discuss all supplements you take with your doctor.   Healthy Mind-Set    Smoking Cessation  Clinical staff conducted group or individual video education with verbal and written material and guidebook.   Patient learns that cigarette smoking and tobacco addiction pose a serious health risk which affects millions of people. Stopping smoking will significantly reduce the risk of heart disease, lung disease, and many forms of cancer. Recommended strategies for quitting are covered, including working with your doctor to develop a successful plan.  Culinary   Becoming a Set Designer conducted group or individual video education with verbal and written material and guidebook.  Patient learns that cooking at home can be healthy, cost-effective, quick, and puts them in control. Keys to cooking healthy recipes will include looking at your recipe, assessing your equipment needs, planning ahead, making it simple, choosing cost-effective seasonal ingredients, and limiting the use of added fats, salts, and sugars.  Cooking - Breakfast and Snacks  Clinical staff conducted group or individual video education with verbal and written material and guidebook.  Patient learns how important breakfast is to satiety and nutrition through the entire day. Recommendations include key foods to eat during breakfast to help stabilize blood sugar levels and to prevent overeating at meals later in the day. Planning ahead is also a key component.  Cooking - Educational Psychologist conducted group or individual video education with verbal and written material and guidebook.  Patient learns eating strategies to improve overall health, including an approach to cook more at home. Recommendations include thinking of animal protein as a side on your plate rather than center stage and focusing instead on lower calorie dense options like vegetables, fruits, whole grains, and plant-based proteins, such as beans. Making sauces in large quantities to freeze for later and leaving the skin on your vegetables are also recommended to maximize your experience.  Cooking - Healthy Salads and Dressing Clinical staff  conducted group or individual video education with verbal and written material and guidebook.  Patient learns that vegetables, fruits, whole grains, and legumes are the foundations of the Pritikin Eating Plan. Recommendations include how to incorporate each of these in flavorful and healthy salads, and how to create homemade salad dressings. Proper handling of ingredients is also covered. Cooking - Soups and [employer] - Soups and Desserts Clinical staff conducted group or individual video education with verbal and written material and guidebook.  Patient  learns that Pritikin soups and desserts make for easy, nutritious, and delicious snacks and meal components that are low in sodium, fat, sugar, and calorie density, while high in vitamins, minerals, and filling fiber. Recommendations include simple and healthy ideas for soups and desserts.   Overview     The Pritikin Solution Program Overview Clinical staff conducted group or individual video education with verbal and written material and guidebook.  Patient learns that the results of the Pritikin Program have been documented in more than 100 articles published in peer-reviewed journals, and the benefits include reducing risk factors for (and, in some cases, even reversing) high cholesterol, high blood pressure, type 2 diabetes, obesity, and more! An overview of the three key pillars of the Pritikin Program will be covered: eating well, doing regular exercise, and having a healthy mind-set.  WORKSHOPS  Exercise: Exercise Basics: Building Your Action Plan Clinical staff led group instruction and group discussion with PowerPoint presentation and patient guidebook. To enhance the learning environment the use of posters, models and videos may be added. At the conclusion of this workshop, patients will comprehend the difference between physical activity and exercise, as well as the benefits of incorporating both, into their routine. Patients will  understand the FITT (Frequency, Intensity, Time, and Type) principle and how to use it to build an exercise action plan. In addition, safety concerns and other considerations for exercise and cardiac rehab will be addressed by the presenter. The purpose of this lesson is to promote a comprehensive and effective weekly exercise routine in order to improve patients overall level of fitness.   Managing Heart Disease: Your Path to a Healthier Heart Clinical staff led group instruction and group discussion with PowerPoint presentation and patient guidebook. To enhance the learning environment the use of posters, models and videos may be added.At the conclusion of this workshop, patients will understand the anatomy and physiology of the heart. Additionally, they will understand how Pritikins three pillars impact the risk factors, the progression, and the management of heart disease.  The purpose of this lesson is to provide a high-level overview of the heart, heart disease, and how the Pritikin lifestyle positively impacts risk factors.  Exercise Biomechanics Clinical staff led group instruction and group discussion with PowerPoint presentation and patient guidebook. To enhance the learning environment the use of posters, models and videos may be added. Patients will learn how the structural parts of their bodies function and how these functions impact their daily activities, movement, and exercise. Patients will learn how to promote a neutral spine, learn how to manage pain, and identify ways to improve their physical movement in order to promote healthy living. The purpose of this lesson is to expose patients to common physical limitations that impact physical activity. Participants will learn practical ways to adapt and manage aches and pains, and to minimize their effect on regular exercise. Patients will learn how to maintain good posture while sitting, walking, and lifting.  Balance Training  and Fall Prevention  Clinical staff led group instruction and group discussion with PowerPoint presentation and patient guidebook. To enhance the learning environment the use of posters, models and videos may be added. At the conclusion of this workshop, patients will understand the importance of their sensorimotor skills (vision, proprioception, and the vestibular system) in maintaining their ability to balance as they age. Patients will apply a variety of balancing exercises that are appropriate for their current level of function. Patients will understand the common causes for poor balance, possible  solutions to these problems, and ways to modify their physical environment in order to minimize their fall risk. The purpose of this lesson is to teach patients about the importance of maintaining balance as they age and ways to minimize their risk of falling.  WORKSHOPS   Nutrition:  Fueling a Ship Broker led group instruction and group discussion with PowerPoint presentation and patient guidebook. To enhance the learning environment the use of posters, models and videos may be added. Patients will review the foundational principles of the Pritikin Eating Plan and understand what constitutes a serving size in each of the food groups. Patients will also learn Pritikin-friendly foods that are better choices when away from home and review make-ahead meal and snack options. Calorie density will be reviewed and applied to three nutrition priorities: weight maintenance, weight loss, and weight gain. The purpose of this lesson is to reinforce (in a group setting) the key concepts around what patients are recommended to eat and how to apply these guidelines when away from home by planning and selecting Pritikin-friendly options. Patients will understand how calorie density may be adjusted for different weight management goals.  Mindful Eating  Clinical staff led group instruction and group  discussion with PowerPoint presentation and patient guidebook. To enhance the learning environment the use of posters, models and videos may be added. Patients will briefly review the concepts of the Pritikin Eating Plan and the importance of low-calorie dense foods. The concept of mindful eating will be introduced as well as the importance of paying attention to internal hunger signals. Triggers for non-hunger eating and techniques for dealing with triggers will be explored. The purpose of this lesson is to provide patients with the opportunity to review the basic principles of the Pritikin Eating Plan, discuss the value of eating mindfully and how to measure internal cues of hunger and fullness using the Hunger Scale. Patients will also discuss reasons for non-hunger eating and learn strategies to use for controlling emotional eating.  Targeting Your Nutrition Priorities Clinical staff led group instruction and group discussion with PowerPoint presentation and patient guidebook. To enhance the learning environment the use of posters, models and videos may be added. Patients will learn how to determine their genetic susceptibility to disease by reviewing their family history. Patients will gain insight into the importance of diet as part of an overall healthy lifestyle in mitigating the impact of genetics and other environmental insults. The purpose of this lesson is to provide patients with the opportunity to assess their personal nutrition priorities by looking at their family history, their own health history and current risk factors. Patients will also be able to discuss ways of prioritizing and modifying the Pritikin Eating Plan for their highest risk areas  Menu  Clinical staff led group instruction and group discussion with PowerPoint presentation and patient guidebook. To enhance the learning environment the use of posters, models and videos may be added. Using menus brought in from [employer],  or printed from [employer], patients will apply the Pritikin dining out guidelines that were presented in the [employer] video. Patients will also be able to practice these guidelines in a variety of provided scenarios. The purpose of this lesson is to provide patients with the opportunity to practice hands-on learning of the Pritikin Dining Out guidelines with actual menus and practice scenarios.  Label Reading Clinical staff led group instruction and group discussion with PowerPoint presentation and patient guidebook. To enhance the learning environment the  use of posters, models and videos may be added. Patients will review and discuss the Pritikin label reading guidelines presented in Pritikins Label Reading Educational series video. Using fool labels brought in from local grocery stores and markets, patients will apply the label reading guidelines and determine if the packaged food meet the Pritikin guidelines. The purpose of this lesson is to provide patients with the opportunity to review, discuss, and practice hands-on learning of the Pritikin Label Reading guidelines with actual packaged food labels. Cooking School  Pritikins [employer] are designed to teach patients ways to prepare quick, simple, and affordable recipes at home. The importance of nutritions role in chronic disease risk reduction is reflected in its emphasis in the overall Pritikin program. By learning how to prepare essential core Pritikin Eating Plan recipes, patients will increase control over what they eat; be able to customize the flavor of foods without the use of added salt, sugar, or fat; and improve the quality of the food they consume. By learning a set of core recipes which are easily assembled, quickly prepared, and affordable, patients are more likely to prepare more healthy foods at home. These workshops focus on convenient breakfasts, simple entres, side dishes, and desserts which  can be prepared with minimal effort and are consistent with nutrition recommendations for cardiovascular risk reduction. Cooking [employer] are taught by a armed forces logistics/support/administrative officer (RD) who has been trained by the [employer]. The chef or RD has a clear understanding of the importance of minimizing - if not completely eliminating - added fat, sugar, and sodium in recipes. Throughout the series of Cooking School Workshop sessions, patients will learn about healthy ingredients and efficient methods of cooking to build confidence in their capability to prepare    Cooking School weekly topics:  Adding Flavor- Sodium-Free  Fast and Healthy Breakfasts  Powerhouse Plant-Based Proteins  Satisfying Salads and Dressings  Simple Sides and Sauces  International Cuisine-Spotlight on the [employer] Zones  Delicious Desserts  Savory Soups  [employer] - Meals in a Astronomer Appetizers and Snacks  Comforting Weekend Breakfasts  One-Pot Wonders   Fast Evening Meals  Landscape Architect Your Pritikin Plate  WORKSHOPS   Healthy Mindset (Psychosocial):  Focused Goals, Sustainable Changes Clinical staff led group instruction and group discussion with PowerPoint presentation and patient guidebook. To enhance the learning environment the use of posters, models and videos may be added. Patients will be able to apply effective goal setting strategies to establish at least one personal goal, and then take consistent, meaningful action toward that goal. They will learn to identify common barriers to achieving personal goals and develop strategies to overcome them. Patients will also gain an understanding of how our mind-set can impact our ability to achieve goals and the importance of cultivating a positive and growth-oriented mind-set. The purpose of this lesson is to provide patients with a deeper understanding of how to set and achieve personal goals, as well as the tools  and strategies needed to overcome common obstacles which may arise along the way.  From Head to Heart: The Power of a Healthy Outlook  Clinical staff led group instruction and group discussion with PowerPoint presentation and patient guidebook. To enhance the learning environment the use of posters, models and videos may be added. Patients will be able to recognize and describe the impact of emotions and mood on physical health. They will discover the importance of self-care and explore self-care practices which  may work for them. Patients will also learn how to utilize the 4 Cs to cultivate a healthier outlook and better manage stress and challenges. The purpose of this lesson is to demonstrate to patients how a healthy outlook is an essential part of maintaining good health, especially as they continue their cardiac rehab journey.  Healthy Sleep for a Healthy Heart Clinical staff led group instruction and group discussion with PowerPoint presentation and patient guidebook. To enhance the learning environment the use of posters, models and videos may be added. At the conclusion of this workshop, patients will be able to demonstrate knowledge of the importance of sleep to overall health, well-being, and quality of life. They will understand the symptoms of, and treatments for, common sleep disorders. Patients will also be able to identify daytime and nighttime behaviors which impact sleep, and they will be able to apply these tools to help manage sleep-related challenges. The purpose of this lesson is to provide patients with a general overview of sleep and outline the importance of quality sleep. Patients will learn about a few of the most common sleep disorders. Patients will also be introduced to the concept of sleep hygiene, and discover ways to self-manage certain sleeping problems through simple daily behavior changes. Finally, the workshop will motivate patients by clarifying the links between  quality sleep and their goals of heart-healthy living.   Recognizing and Reducing Stress Clinical staff led group instruction and group discussion with PowerPoint presentation and patient guidebook. To enhance the learning environment the use of posters, models and videos may be added. At the conclusion of this workshop, patients will be able to understand the types of stress reactions, differentiate between acute and chronic stress, and recognize the impact that chronic stress has on their health. They will also be able to apply different coping mechanisms, such as reframing negative self-talk. Patients will have the opportunity to practice a variety of stress management techniques, such as deep abdominal breathing, progressive muscle relaxation, and/or guided imagery.  The purpose of this lesson is to educate patients on the role of stress in their lives and to provide healthy techniques for coping with it.  Learning Barriers/Preferences:  Learning Barriers/Preferences - 09/15/24 0856       Learning Barriers/Preferences   Learning Barriers Exercise Concerns   Having a gout flare up both feet are hurting   Learning Preferences Audio;Verbal Instruction;Video;Computer/Internet;Group Instruction;Written Material;Pictoral;Skilled Demonstration;Individual Instruction          Education Topics:  Knowledge Questionnaire Score:  Knowledge Questionnaire Score - 09/15/24 0831       Knowledge Questionnaire Score   Pre Score 22/24          Core Components/Risk Factors/Patient Goals at Admission:  Personal Goals and Risk Factors at Admission - 09/15/24 0834       Core Components/Risk Factors/Patient Goals on Admission    Weight Management Yes;Obesity;Weight Loss    Intervention Weight Management/Obesity: Establish reasonable short term and long term weight goals.;Obesity: Provide education and appropriate resources to help participant work on and attain dietary goals.    Admit Weight 201 lb  15.1 oz (91.6 kg)    Goal Weight: Short Term 196 lb (88.9 kg)    Goal Weight: Long Term 170 lb (77.1 kg)    Expected Outcomes Short Term: Continue to assess and modify interventions until short term weight is achieved;Long Term: Adherence to nutrition and physical activity/exercise program aimed toward attainment of established weight goal;Weight Loss: Understanding of general recommendations for a balanced  deficit meal plan, which promotes 1-2 lb weight loss per week and includes a negative energy balance of [phone] kcal/d    Tobacco Cessation Yes    Number of packs per day 0.5 quit 06/09/24    Intervention Offer self-teaching materials, assist with locating and accessing local/national Quit Smoking programs, and support quit date choice.    Expected Outcomes Short Term: Will quit all tobacco product use, adhering to prevention of relapse plan.;Long Term: Complete abstinence from all tobacco products for at least 12 months from quit date.    Lipids Yes    Intervention Provide education and support for participant on nutrition & aerobic/resistive exercise along with prescribed medications to achieve LDL 70mg , HDL >40mg .    Expected Outcomes Short Term: Participant states understanding of desired cholesterol values and is compliant with medications prescribed. Participant is following exercise prescription and nutrition guidelines.;Long Term: Cholesterol controlled with medications as prescribed, with individualized exercise RX and with personalized nutrition plan. Value goals: LDL < 70mg , HDL > 40 mg.    Stress Yes    Intervention Offer individual and/or small group education and counseling on adjustment to heart disease, stress management and health-related lifestyle change. Teach and support self-help strategies.;Refer participants experiencing significant psychosocial distress to appropriate mental health specialists for further evaluation and treatment. When possible, include family members and  significant others in education/counseling sessions.    Expected Outcomes Short Term: Participant demonstrates changes in health-related behavior, relaxation and other stress management skills, ability to obtain effective social support, and compliance with psychotropic medications if prescribed.;Long Term: Emotional wellbeing is indicated by absence of clinically significant psychosocial distress or social isolation.          Core Components/Risk Factors/Patient Goals Review:   Goals and Risk Factor Review     Row Name 09/25/24 1454             Core Components/Risk Factors/Patient Goals Review   Personal Goals Review Weight Management/Obesity;Lipids;Stress;Tobacco Cessation       Review Jacob Black started cardiac rehab on 123/26. Jacob Black is off to a good start to exercise. Vital signs were stable       Expected Outcomes Jan will continue to participate in cardiac rehab for exercise, nutrition and lifestyle modifications          Core Components/Risk Factors/Patient Goals at Discharge (Final Review):   Goals and Risk Factor Review - 09/25/24 1454       Core Components/Risk Factors/Patient Goals Review   Personal Goals Review Weight Management/Obesity;Lipids;Stress;Tobacco Cessation    Review Jacob Black started cardiac rehab on 123/26. Jacob Black is off to a good start to exercise. Vital signs were stable    Expected Outcomes Jan will continue to participate in cardiac rehab for exercise, nutrition and lifestyle modifications          ITP Comments:  ITP Comments     Row Name 09/15/24 0758 09/25/24 1449         ITP Comments Wilbert Bihari, MD: Medical Director. Introduction to the [facility] / Intensive [facility]iac Rehab. Initial orientation packet reviewed with the patient. 30 Day ITP review.  Jervis started cardiac rehab on today, 09/25/24, and tolerated exercise well.         Comments: see ITP comments    [1]  Current Outpatient Medications:    acetaminophen  (TYLENOL ) 325 MG  tablet, Take 2 tablets (650 mg total) by mouth every 6 (six) hours as needed for mild pain (pain score 1-3). (Patient not taking: Reported on 09/15/2024), Disp: ,  Rfl:    aspirin  EC 81 MG tablet, Take 1 tablet (81 mg total) by mouth daily. Swallow whole., Disp: , Rfl:    clopidogrel  (PLAVIX ) 75 MG tablet, Take 1 tablet (75 mg total) by mouth daily., Disp: 90 tablet, Rfl: 3   colchicine  0.6 MG tablet, Take 0.6-1.2 mg by mouth 2 (two) times daily as needed (gout flare)., Disp: , Rfl:    Febuxostat  80 MG TABS, Take 1 tablet by mouth daily., Disp: , Rfl:    folic acid  (FOLVITE ) 1 MG tablet, Take 1 tablet (1 mg total) by mouth daily. (Patient not taking: Reported on 09/24/2024), Disp: , Rfl:    metoprolol  tartrate (LOPRESSOR ) 25 MG tablet, Take 1 tablet (25 mg total) by mouth 2 (two) times daily., Disp: 180 tablet, Rfl: 3   Multiple Vitamin (MULTIVITAMIN WITH MINERALS) TABS tablet, Take 1 tablet by mouth daily. (Patient not taking: Reported on 09/24/2024), Disp: , Rfl:    nystatin -triamcinolone  ointment (MYCOLOG), Apply 1 Application topically 2 (two) times daily. (Patient not taking: Reported on 09/24/2024), Disp: 30 g, Rfl: 0   oxyCODONE  (OXY IR/ROXICODONE ) 5 MG immediate release tablet, Take 5 mg by mouth every 4 (four) hours as needed for severe pain (pain score 7-10). (Patient not taking: Reported on 09/24/2024), Disp: , Rfl:    predniSONE  (STERAPRED UNI-PAK 21 TAB) 5 MG (21) TBPK tablet, Take 5 mg by mouth daily. (Patient not taking: Reported on 09/24/2024), Disp: , Rfl:    rosuvastatin  (CRESTOR ) 40 MG tablet, Take 1 tablet (40 mg total) by mouth daily., Disp: 90 tablet, Rfl: 3   thiamine  (VITAMIN B-1) 100 MG tablet, Take 1 tablet (100 mg total) by mouth daily. (Patient not taking: Reported on 09/24/2024), Disp: , Rfl:  [2]  Social History Tobacco Use  Smoking Status Former   Current packs/day: 0.00   Average packs/day: 0.5 packs/day for 49.8 years (24.9 ttl pk-yrs)   Types: Cigarettes   Start date:  09/03/1974   Quit date: 06/09/2024   Years since quitting: 0.3  Smokeless Tobacco Never

## 2024-09-30 ENCOUNTER — Encounter (HOSPITAL_COMMUNITY)

## 2024-09-30 ENCOUNTER — Encounter (HOSPITAL_COMMUNITY)
Admission: RE | Admit: 2024-09-30 | Discharge: 2024-09-30 | Disposition: A | Source: Ambulatory Visit | Attending: Cardiology

## 2024-09-30 DIAGNOSIS — I213 ST elevation (STEMI) myocardial infarction of unspecified site: Secondary | ICD-10-CM

## 2024-09-30 DIAGNOSIS — Z951 Presence of aortocoronary bypass graft: Secondary | ICD-10-CM | POA: Diagnosis not present

## 2024-10-02 ENCOUNTER — Encounter (HOSPITAL_COMMUNITY)

## 2024-10-02 ENCOUNTER — Encounter (HOSPITAL_COMMUNITY)
Admission: RE | Admit: 2024-10-02 | Discharge: 2024-10-02 | Disposition: A | Source: Ambulatory Visit | Attending: Cardiology

## 2024-10-02 DIAGNOSIS — I213 ST elevation (STEMI) myocardial infarction of unspecified site: Secondary | ICD-10-CM

## 2024-10-02 DIAGNOSIS — Z951 Presence of aortocoronary bypass graft: Secondary | ICD-10-CM | POA: Diagnosis not present

## 2024-10-05 ENCOUNTER — Encounter (HOSPITAL_COMMUNITY)

## 2024-10-07 ENCOUNTER — Ambulatory Visit: Admitting: Cardiology

## 2024-10-07 ENCOUNTER — Encounter (HOSPITAL_COMMUNITY)

## 2024-10-07 ENCOUNTER — Encounter (HOSPITAL_COMMUNITY)
Admission: RE | Admit: 2024-10-07 | Discharge: 2024-10-07 | Disposition: A | Source: Ambulatory Visit | Attending: Cardiology

## 2024-10-07 ENCOUNTER — Encounter: Payer: Self-pay | Admitting: Cardiology

## 2024-10-07 VITALS — BP 117/73 | HR 68 | Ht 69.0 in | Wt 200.7 lb

## 2024-10-07 DIAGNOSIS — I2510 Atherosclerotic heart disease of native coronary artery without angina pectoris: Secondary | ICD-10-CM

## 2024-10-07 DIAGNOSIS — R0683 Snoring: Secondary | ICD-10-CM

## 2024-10-07 DIAGNOSIS — Z951 Presence of aortocoronary bypass graft: Secondary | ICD-10-CM

## 2024-10-07 DIAGNOSIS — I213 ST elevation (STEMI) myocardial infarction of unspecified site: Secondary | ICD-10-CM

## 2024-10-07 DIAGNOSIS — E7800 Pure hypercholesterolemia, unspecified: Secondary | ICD-10-CM | POA: Diagnosis not present

## 2024-10-07 DIAGNOSIS — E7841 Elevated Lipoprotein(a): Secondary | ICD-10-CM

## 2024-10-07 LAB — HEPATIC FUNCTION PANEL
ALT: 27 [IU]/L (ref 0–44)
AST: 26 [IU]/L (ref 0–40)
Albumin: 4.8 g/dL (ref 3.9–4.9)
Alkaline Phosphatase: 79 [IU]/L (ref 47–123)
Bilirubin Total: 0.4 mg/dL (ref 0.0–1.2)
Bilirubin, Direct: 0.12 mg/dL (ref 0.00–0.40)
Total Protein: 7.7 g/dL (ref 6.0–8.5)

## 2024-10-07 LAB — LIPID PANEL
Chol/HDL Ratio: 2.9 ratio (ref 0.0–5.0)
Cholesterol, Total: 137 mg/dL (ref 100–199)
HDL: 47 mg/dL
LDL Chol Calc (NIH): 72 mg/dL (ref 0–99)
Triglycerides: 96 mg/dL (ref 0–149)
VLDL Cholesterol Cal: 18 mg/dL (ref 5–40)

## 2024-10-07 NOTE — Patient Instructions (Addendum)
 Medication Instructions:  Your physician recommends that you continue on your current medications as directed. Please refer to the Current Medication list given to you today.  *If you need a refill on your cardiac medications before your next appointment, please call your pharmacy*  Lab Work: Your physician recommends that you return for a FASTING lipid profile and a HFP: TODAY  Lab Orders         Lipid panel         Hepatic function panel      If you have labs (blood work) drawn today and your tests are completely normal, you will receive your results only by: MyChart Message (if you have MyChart) OR A paper copy in the mail If you have any lab test that is abnormal or we need to change your treatment, we will call you to review the results.    Follow-Up: At [facility], you and your health needs are our priority.  As part of our continuing mission to provide you with exceptional heart care, our providers are all part of one team.  This team includes your primary Cardiologist (physician) and Advanced Practice Providers or APPs (Physician Assistants and Nurse Practitioners) who all work together to provide you with the care you need, when you need it.  Your next appointment:   6 month(s)  Provider:   Gordy Bergamo, MD  Your provider :      Other Instructions  Your physician has recommended that you have a sleep study. This test records several body functions during sleep, including: brain activity, eye movement, oxygen  and carbon dioxide blood levels, heart rate and rhythm, breathing rate and rhythm, the flow of air through your mouth and nose, snoring, body muscle movements, and chest and belly movement.   Also, You have been referred to Pharmacist to discuss Repatha.      We recommend signing up for the patient portal called MyChart.  Patients are able to view lab/test results, encounter notes, upcoming appointments, etc.  Non-urgent messages can be sent to your provider  as well, go to [email or web address].

## 2024-10-07 NOTE — Progress Notes (Unsigned)
 " Cardiology Office Note:  .   Date:  10/08/2024  ID:  Jacob Black, DOB 11-01-1960, MRN [id number] PCP: [name], MD  [facility] Providers Cardiologist:  Gordy Bergamo, MD   History of Present Illness: Jacob   KASHTYN Black is a 64 y.o. CAD status post inferior STEMI in 06/10/2024 with balloon angioplasty to RCA and CABG x 4 on 06/13/2024 (LIMA to LAD, SVG to D1, SVG to OM1 and SVG to R-PDA), ischemic cardiomyopathy (EF 50-55% October 2025), hyperlipidemia, elevated LP(a), gout, and prior tobacco abuse (quit 06/2024).  He was then seen him in the office on 09/24/2024 after he had a ED visit for chest pain, did not make any changes today and presents for a 26-month office visit.      Discussed the use of AI scribe software for clinical note transcription with the patient, who gave verbal consent to proceed.  History of Present Illness Jacob Black is a 64 year old male with coronary artery disease who presents for follow-up after heart catheterization and artery opening. Referred by Dr. Jeni for follow-up after heart catheterization and artery opening.  He underwent heart catheterization with artery opening on October 8th, 2025. He feels better overall but notes decreased energy compared to before. He has no chest pain. He is taking rosuvastatin  40 mg daily, Plavix , and baby aspirin .  He snores loudly at night with frequent awakenings and poor sleep quality. He has not used nicotine since quitting smoking on October 7th, 2025.  He drinks about 14 alcoholic drinks per week, typically [place] at a bar with friends.  He works as a art gallery manager at [employer] with significant walking and attends therapy three times a week for exercise.  Cardiac Studies relevent.     Cardiac Catheterization 06/10/24:    CABG x 4 06/13/2024: LIMA to LAD, SVG to D1, SVG to OM1 and SVG to R-PDA  ECHOCARDIOGRAM COMPLETE 06/10/2024  Normal LVEF at 50 to 55%,  cannot exclude wall motion abnormality.  No significant valvular abnormality.  EKG:      Labs   Lab Results  Component Value Date   CHOL 137 10/07/2024   HDL 47 10/07/2024   LDLCALC 72 10/07/2024   TRIG 96 10/07/2024   CHOLHDL 2.9 10/07/2024   Lab Results  Component Value Date   CHOL 202 (H) 06/10/2024   HDL 40 (L) 06/10/2024   LDLCALC 111 (H) 06/10/2024   TRIG 254 (H) 06/10/2024   CHOLHDL 5.1 06/10/2024   Lipoprotein (a)  Date/Time Value Ref Range Status  07/08/2024 11:17 AM 233.5 (H) <75.0 nmol/L Final    Comment:    Note:  Values greater than or equal to 75.0 nmol/L may        indicate an independent risk factor for CHD,        but must be evaluated with caution when applied        to non-Caucasian populations due to the        influence of genetic factors on Lp(a) across        ethnicities.     Recent Labs    06/17/24 0313 06/18/24 0342 07/08/24 1117 09/18/24 1845  NA 133* 133* 141 138  K 4.2 4.0 4.9 4.0  CL 97* 98 104 102  CO2 25 24 22  21*  GLUCOSE 110* 98 89 85  BUN 26* 28* 23 17  CREATININE 1.43* 1.50* 1.15 1.11  CALCIUM  8.5* 8.6* 9.8 9.6  GFRNONAA 56* 53*  --  >60    Lab Results  Component Value Date   ALT 27 10/07/2024   AST 26 10/07/2024   ALKPHOS 79 10/07/2024   BILITOT 0.4 10/07/2024      Latest Ref Rng & Units 09/18/2024    6:45 PM 06/15/2024    4:21 AM 06/14/2024    4:58 PM  CBC  WBC 4.0 - 10.5 K/uL 9.2  13.6  12.9   Hemoglobin 13.0 - 17.0 g/dL 86.3  88.9  88.4   Hematocrit 39.0 - 52.0 % 41.2  32.7  34.8   Platelets 150 - 400 K/uL 261  161  167    Lab Results  Component Value Date   HGBA1C 5.2 06/10/2024    Lab Results  Component Value Date   TSH 2.260 06/10/2024     ROS  Review of Systems  Constitutional: Positive for malaise/fatigue.  Cardiovascular:  Negative for chest pain, dyspnea on exertion and leg swelling.  Respiratory:  Positive for snoring.    Physical Exam:   VS:  BP 117/73   Pulse 68   Ht 5' 9 (1.753 m)    Wt 200 lb 11.2 oz (91 kg)   SpO2 97%   BMI 29.64 kg/m    Wt Readings from Last 3 Encounters:  10/07/24 200 lb 11.2 oz (91 kg)  09/24/24 201 lb (91.2 kg)  09/18/24 198 lb (89.8 kg)    BP Readings from Last 3 Encounters:  10/07/24 117/73  09/24/24 100/64  09/18/24 115/79   Physical Exam Constitutional:      Appearance: He is overweight.  Neck:     Vascular: No carotid bruit or JVD.  Cardiovascular:     Rate and Rhythm: Normal rate and regular rhythm.     Pulses: Intact distal pulses.     Heart sounds: Normal heart sounds. No murmur heard.    No gallop.  Pulmonary:     Effort: Pulmonary effort is normal.     Breath sounds: Normal breath sounds.  Abdominal:     General: Bowel sounds are normal.     Palpations: Abdomen is soft.  Musculoskeletal:     Right lower leg: No edema.     Left lower leg: No edema.     ASSESSMENT AND PLAN: .      ICD-10-CM   1. Coronary artery disease involving native coronary artery of native heart without angina pectoris  I25.10 AMB Referral to [facility] Pharm-D    Lipid panel    Hepatic function panel    CANCELED: Lipid panel    2. Hypercholesteremia  E78.00 AMB Referral to [facility] Pharm[facility]D    Lipid panel    Hepatic function panel    CANCELED: Lipid panel    3. Elevated Lp(a)  E78.41 AMB Referral to [facility] Pharm-D    Lipid panel    Hepatic function panel    CANCELED: Lipid panel    4. Loud snoring  R06.83 [facility] Sleep Study     Assessment & Plan Coronary artery disease status post STEMI and PCI (rescue Balloon PTCA then bypass) Status post STEMI and PCI with multiple blockages. No current chest pain. Emphasis on prevention of further cardiac events through lifestyle modifications and medication adjustments. - Continue aspirin  and Plavix  for one year post-PCI - Referred to clinical pharmacist for cholesterol management - Ordered blood tests including CMP, LFT, and cholesterol levels  Hypercholesterolemia with elevated  lipoprotein(a) Elevated LDL and triglycerides with genetic component (lipoprotein(a)). High risk for further  cardiac events due to significant blockages. Emphasis on aggressive lipid management to prevent future interventions. - Referred to clinical pharmacist for cholesterol management - Ordered blood tests including cholesterol levels - Referred to Rep[facility] for elevated LDL and high Lp(a), goal LDL <55. - Lipid profile reviewed, CMP reviewed, normalization of LFTs and normal renal function.  Suspected obstructive sleep apnea Reports heavy snoring and fatigue, suggestive of obstructive sleep apnea. Potential impact on cardiac health due to nocturnal hypoxemia. - Referred for sleep study to evaluate for obstructive sleep apnea  Nicotine dependence, in remission Successfully quit smoking since October 7th, 2024. No current use of nicotine products. - Encouraged continued abstinence from smoking  Alcohol use disorder Consumes approximately 14 drinks per week. Advised to limit intake to less than two drinks per day. - Advised to limit alcohol intake to less than two drinks per day  Follow up:  6 months. CAD, Mixed hyperchol, Elevated Lpa   Signed,  Gordy Bergamo, MD, [facility] 10/08/2024, 3:41 AM [facility] [address] [place], [name] [address] Phone: [phone]. Fax:  [phone]  "

## 2024-10-08 ENCOUNTER — Ambulatory Visit: Payer: Self-pay | Admitting: Cardiology

## 2024-10-09 ENCOUNTER — Encounter (HOSPITAL_COMMUNITY): Admission: RE | Admit: 2024-10-09 | Source: Ambulatory Visit

## 2024-10-09 ENCOUNTER — Encounter (HOSPITAL_COMMUNITY)

## 2024-10-09 DIAGNOSIS — I213 ST elevation (STEMI) myocardial infarction of unspecified site: Secondary | ICD-10-CM

## 2024-10-09 DIAGNOSIS — Z951 Presence of aortocoronary bypass graft: Secondary | ICD-10-CM

## 2024-10-12 ENCOUNTER — Encounter (HOSPITAL_COMMUNITY)

## 2024-10-14 ENCOUNTER — Encounter (HOSPITAL_COMMUNITY)

## 2024-10-16 ENCOUNTER — Encounter (HOSPITAL_COMMUNITY)

## 2024-10-19 ENCOUNTER — Encounter (HOSPITAL_COMMUNITY)

## 2024-10-20 ENCOUNTER — Ambulatory Visit: Admitting: Pharmacist

## 2024-10-21 ENCOUNTER — Encounter (HOSPITAL_COMMUNITY)

## 2024-10-23 ENCOUNTER — Encounter (HOSPITAL_COMMUNITY)

## 2024-10-26 ENCOUNTER — Encounter (HOSPITAL_COMMUNITY)

## 2024-10-28 ENCOUNTER — Encounter (HOSPITAL_COMMUNITY)

## 2024-10-30 ENCOUNTER — Encounter (HOSPITAL_COMMUNITY)

## 2024-11-02 ENCOUNTER — Encounter (HOSPITAL_COMMUNITY)

## 2024-11-04 ENCOUNTER — Encounter (HOSPITAL_COMMUNITY)

## 2024-11-06 ENCOUNTER — Encounter (HOSPITAL_COMMUNITY)

## 2024-11-09 ENCOUNTER — Encounter (HOSPITAL_COMMUNITY)

## 2024-11-11 ENCOUNTER — Encounter (HOSPITAL_COMMUNITY)

## 2024-11-13 ENCOUNTER — Encounter (HOSPITAL_COMMUNITY)

## 2024-11-16 ENCOUNTER — Encounter (HOSPITAL_COMMUNITY)

## 2024-11-18 ENCOUNTER — Encounter (HOSPITAL_COMMUNITY)

## 2024-11-20 ENCOUNTER — Encounter (HOSPITAL_COMMUNITY)

## 2024-11-23 ENCOUNTER — Encounter (HOSPITAL_COMMUNITY)

## 2024-11-25 ENCOUNTER — Encounter (HOSPITAL_COMMUNITY)

## 2024-11-27 ENCOUNTER — Encounter (HOSPITAL_COMMUNITY)

## 2024-11-30 ENCOUNTER — Encounter (HOSPITAL_COMMUNITY)

## 2024-12-02 ENCOUNTER — Encounter (HOSPITAL_COMMUNITY)

## 2024-12-04 ENCOUNTER — Encounter (HOSPITAL_COMMUNITY)

## 2024-12-07 ENCOUNTER — Encounter (HOSPITAL_COMMUNITY)

## 2024-12-09 ENCOUNTER — Encounter (HOSPITAL_COMMUNITY)

## 2024-12-28 ENCOUNTER — Ambulatory Visit: Admitting: Cardiology
